# Patient Record
Sex: Male | Born: 1950 | Race: White | Hispanic: No | Marital: Married | Smoking: Current every day smoker
Health system: Southern US, Community
[De-identification: ages and names within clinical notes are randomized; demographics above are authoritative.]

## PROBLEM LIST (undated history)

## (undated) DIAGNOSIS — G4730 Sleep apnea, unspecified: Secondary | ICD-10-CM

## (undated) DIAGNOSIS — I739 Peripheral vascular disease, unspecified: Secondary | ICD-10-CM

## (undated) DIAGNOSIS — I10 Essential (primary) hypertension: Secondary | ICD-10-CM

## (undated) DIAGNOSIS — M5430 Sciatica, unspecified side: Secondary | ICD-10-CM

## (undated) DIAGNOSIS — M1990 Unspecified osteoarthritis, unspecified site: Secondary | ICD-10-CM

## (undated) DIAGNOSIS — C801 Malignant (primary) neoplasm, unspecified: Secondary | ICD-10-CM

## (undated) DIAGNOSIS — I639 Cerebral infarction, unspecified: Secondary | ICD-10-CM

## (undated) DIAGNOSIS — E785 Hyperlipidemia, unspecified: Secondary | ICD-10-CM

## (undated) DIAGNOSIS — I6529 Occlusion and stenosis of unspecified carotid artery: Secondary | ICD-10-CM

## (undated) DIAGNOSIS — C140 Malignant neoplasm of pharynx, unspecified: Secondary | ICD-10-CM

## (undated) HISTORY — DX: Unspecified osteoarthritis, unspecified site: M19.90

## (undated) HISTORY — DX: Essential (primary) hypertension: I10

## (undated) HISTORY — DX: Sciatica, unspecified side: M54.30

## (undated) HISTORY — PX: CERVICAL LAMINECTOMY: SHX94

## (undated) HISTORY — DX: Hyperlipidemia, unspecified: E78.5

## (undated) HISTORY — PX: COLONOSCOPY: SHX174

## (undated) HISTORY — DX: Sleep apnea, unspecified: G47.30

## (undated) HISTORY — PX: OTHER SURGICAL HISTORY: SHX169

## (undated) HISTORY — DX: Occlusion and stenosis of unspecified carotid artery: I65.29

## (undated) HISTORY — PX: CYST EXCISION: SHX5701

## (undated) HISTORY — PX: WISDOM TOOTH EXTRACTION: SHX21

---

## 1986-01-12 DIAGNOSIS — K269 Duodenal ulcer, unspecified as acute or chronic, without hemorrhage or perforation: Secondary | ICD-10-CM

## 1986-01-12 HISTORY — DX: Duodenal ulcer, unspecified as acute or chronic, without hemorrhage or perforation: K26.9

## 2006-12-29 ENCOUNTER — Ambulatory Visit: Payer: Self-pay | Admitting: Gastroenterology

## 2008-11-28 ENCOUNTER — Ambulatory Visit: Payer: Self-pay | Admitting: Internal Medicine

## 2015-08-27 ENCOUNTER — Encounter: Payer: Self-pay | Admitting: Emergency Medicine

## 2015-08-27 ENCOUNTER — Emergency Department
Admission: EM | Admit: 2015-08-27 | Discharge: 2015-08-27 | Disposition: A | Discharge status: Home / Self Care | Payer: Medicare Other | Attending: Emergency Medicine | Admitting: Emergency Medicine

## 2015-08-27 ENCOUNTER — Emergency Department: Payer: Medicare Other

## 2015-08-27 DIAGNOSIS — Z5321 Procedure and treatment not carried out due to patient leaving prior to being seen by health care provider: Secondary | ICD-10-CM | POA: Insufficient documentation

## 2015-08-27 DIAGNOSIS — R05 Cough: Secondary | ICD-10-CM | POA: Diagnosis not present

## 2015-08-27 DIAGNOSIS — R51 Headache: Secondary | ICD-10-CM | POA: Diagnosis not present

## 2015-08-27 DIAGNOSIS — R0602 Shortness of breath: Secondary | ICD-10-CM | POA: Insufficient documentation

## 2015-08-27 DIAGNOSIS — F17210 Nicotine dependence, cigarettes, uncomplicated: Secondary | ICD-10-CM | POA: Diagnosis not present

## 2015-08-27 IMAGING — CR DG CHEST 2V
1 series · 2 of 2 positions shown · non-contrast
Comparison: None.

CLINICAL DATA: Cough and short of breath

EXAM:
CHEST  2 VIEW

[Series 1: w chest pa · 0.14mm/px · 2 of 2 slices shown]
[im 1/2]
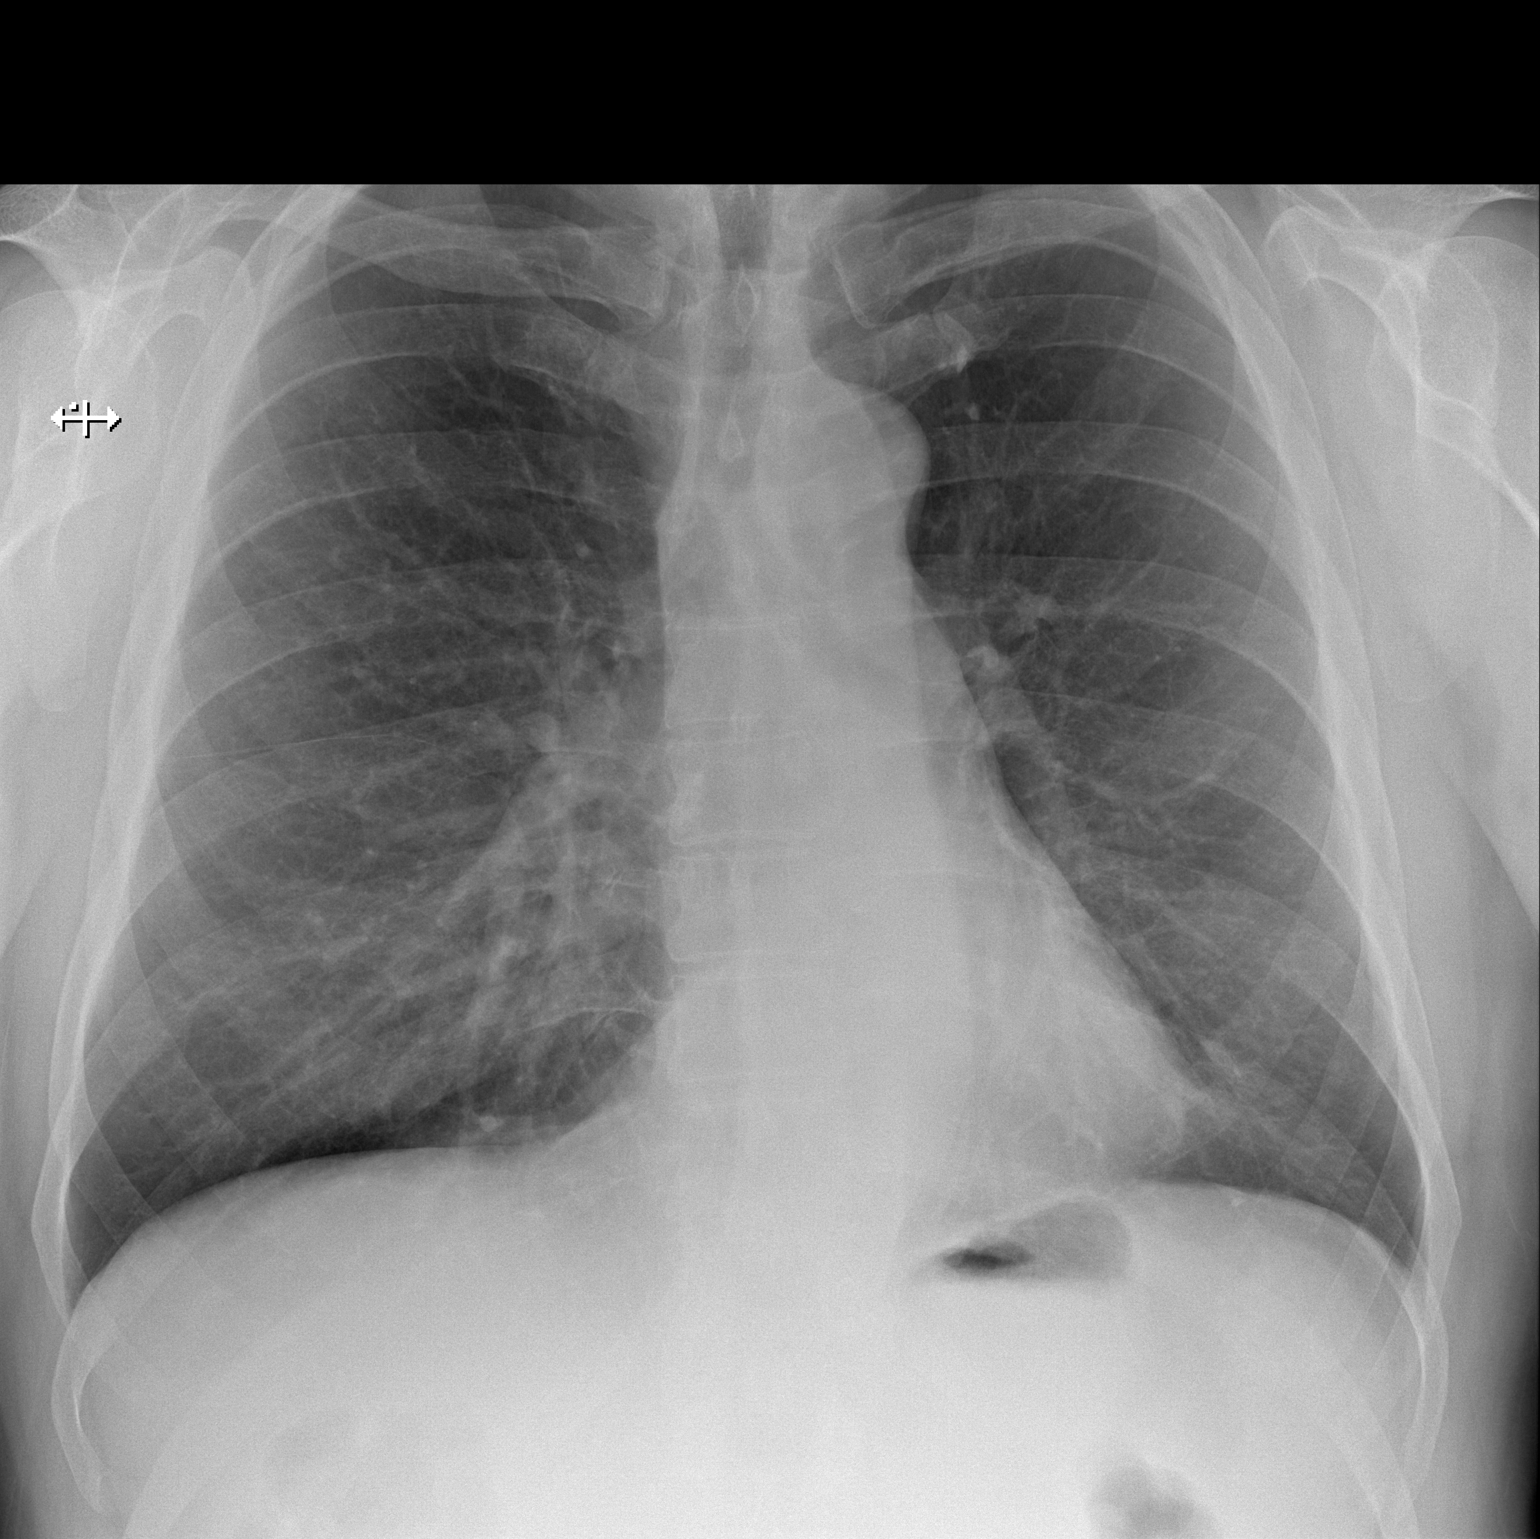
[im 2/2]
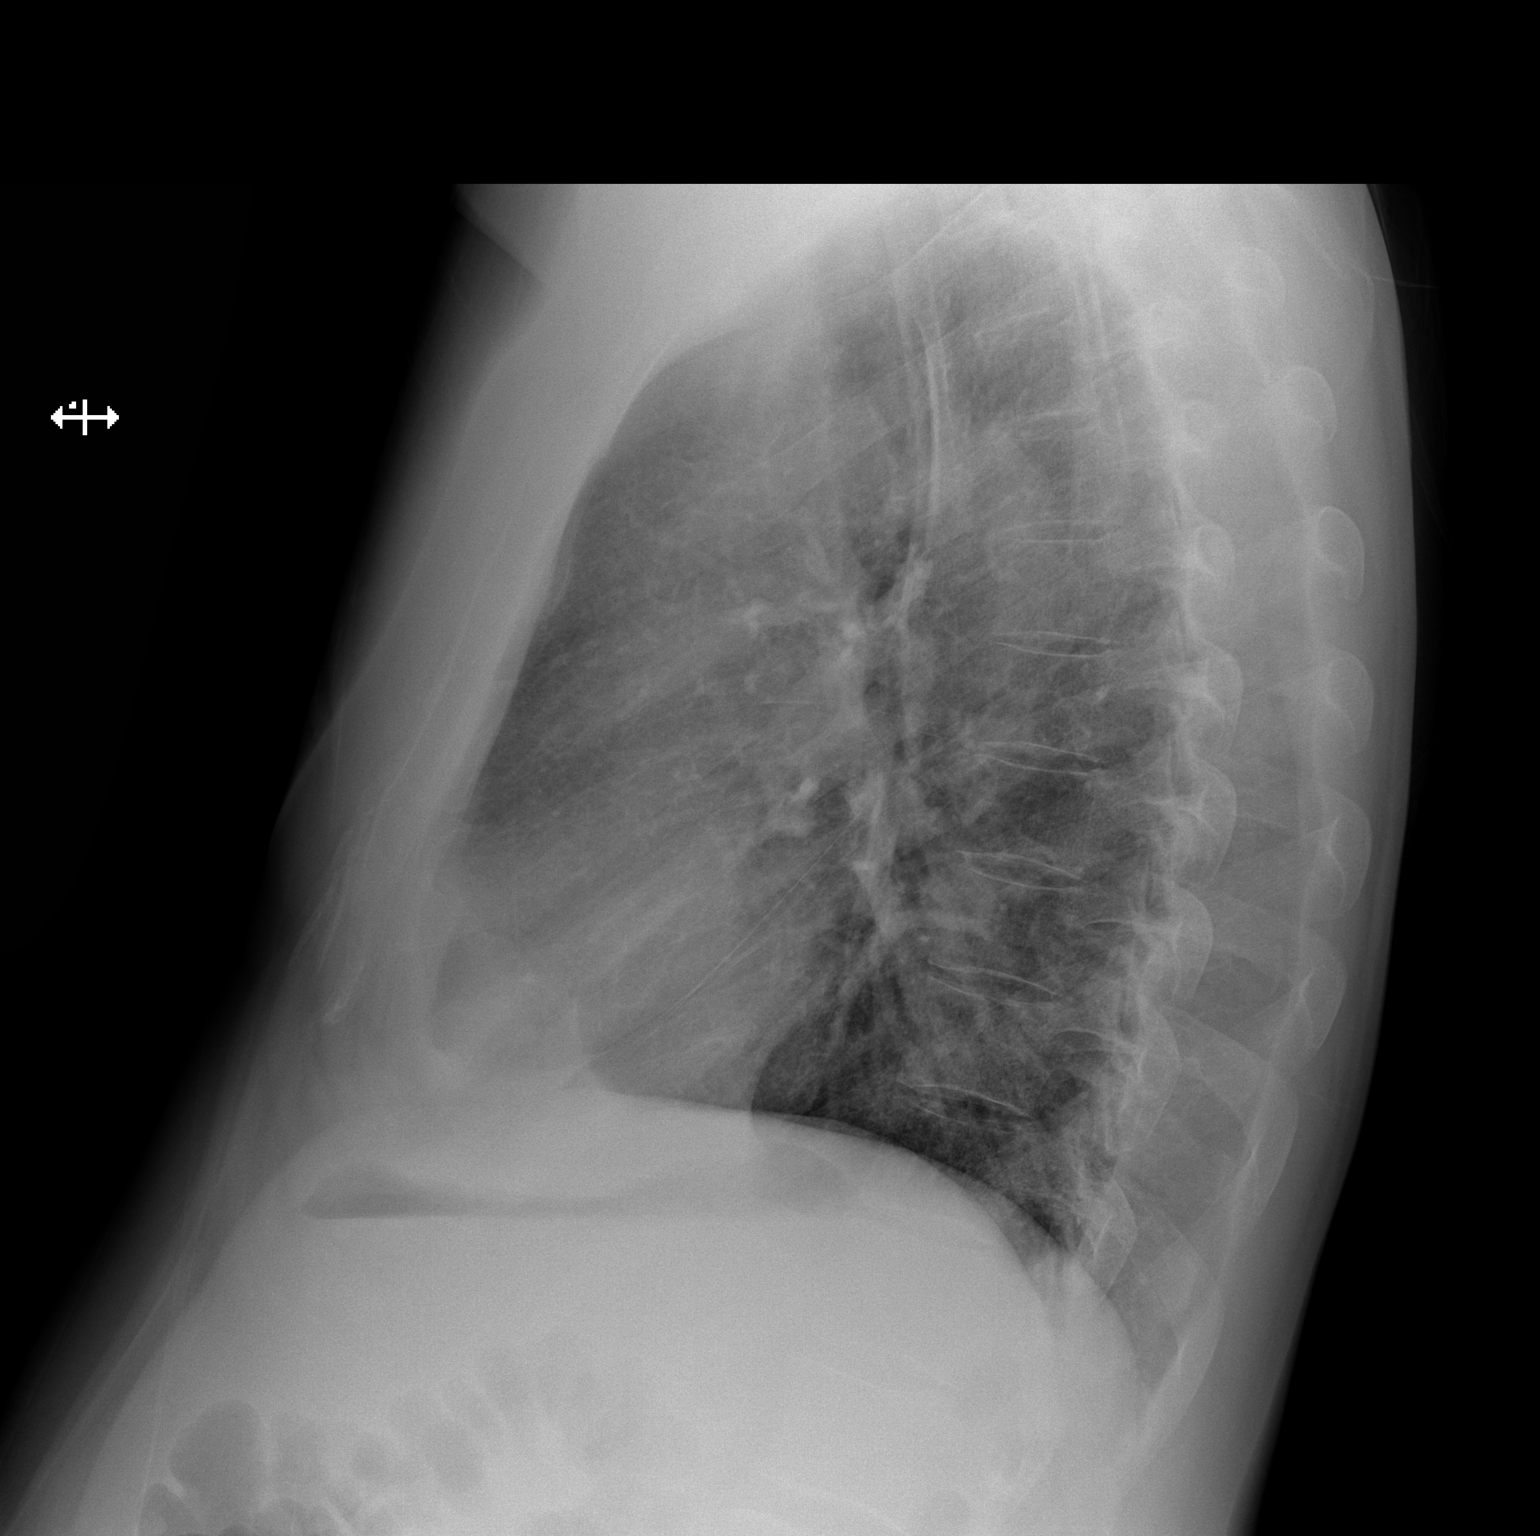

[2 of 2 positions shown; findings below may reference images not displayed]

FINDINGS: The heart size and mediastinal contours are within normal limits.
Both lungs are clear. The visualized skeletal structures are
unremarkable.
IMPRESSION: No active cardiopulmonary disease.

## 2015-08-27 NOTE — ED Triage Notes (Signed)
Pt comes into the ED via POV c/o shortness of breath along with a cough and headache.  Patient presents in NAD at this time with even and unlabored respirations.  Patient believes that every time he takes a deep breath, he will begin coughing.  Denies any chest pain, N/V.  Patient was around his sick grandchildren this past weekend and has been feeling bad since then.  Denies any fever at this time.

## 2016-07-17 ENCOUNTER — Emergency Department (HOSPITAL_COMMUNITY): Payer: Medicare Other

## 2016-07-17 ENCOUNTER — Emergency Department (HOSPITAL_COMMUNITY)
Admission: EM | Admit: 2016-07-17 | Discharge: 2016-07-18 | Disposition: A | Discharge status: Home / Self Care | Payer: Medicare Other | Attending: Emergency Medicine | Admitting: Emergency Medicine

## 2016-07-17 ENCOUNTER — Encounter (HOSPITAL_COMMUNITY): Payer: Self-pay | Admitting: Emergency Medicine

## 2016-07-17 DIAGNOSIS — I6522 Occlusion and stenosis of left carotid artery: Secondary | ICD-10-CM

## 2016-07-17 DIAGNOSIS — F17210 Nicotine dependence, cigarettes, uncomplicated: Secondary | ICD-10-CM | POA: Diagnosis not present

## 2016-07-17 DIAGNOSIS — G453 Amaurosis fugax: Secondary | ICD-10-CM

## 2016-07-17 DIAGNOSIS — H538 Other visual disturbances: Secondary | ICD-10-CM | POA: Insufficient documentation

## 2016-07-17 LAB — BASIC METABOLIC PANEL
ANION GAP: 8 (ref 5–15)
BUN: 11 mg/dL (ref 6–20)
CALCIUM: 9.2 mg/dL (ref 8.9–10.3)
CO2: 24 mmol/L (ref 22–32)
CREATININE: 0.9 mg/dL (ref 0.61–1.24)
Chloride: 104 mmol/L (ref 101–111)
GLUCOSE: 101 mg/dL — AB (ref 65–99)
Potassium: 3.6 mmol/L (ref 3.5–5.1)
Sodium: 136 mmol/L (ref 135–145)

## 2016-07-17 LAB — CBC WITH DIFFERENTIAL/PLATELET
BASOS ABS: 0 10*3/uL (ref 0.0–0.1)
BASOS PCT: 1 %
EOS PCT: 2 %
Eosinophils Absolute: 0.2 10*3/uL (ref 0.0–0.7)
HCT: 39.9 % (ref 39.0–52.0)
Hemoglobin: 13.7 g/dL (ref 13.0–17.0)
Lymphocytes Relative: 36 %
Lymphs Abs: 2.6 10*3/uL (ref 0.7–4.0)
MCH: 31.6 pg (ref 26.0–34.0)
MCHC: 34.3 g/dL (ref 30.0–36.0)
MCV: 92.1 fL (ref 78.0–100.0)
MONOS PCT: 9 %
Monocytes Absolute: 0.7 10*3/uL (ref 0.1–1.0)
NEUTROS ABS: 3.7 10*3/uL (ref 1.7–7.7)
Neutrophils Relative %: 52 %
PLATELETS: 180 10*3/uL (ref 150–400)
RBC: 4.33 MIL/uL (ref 4.22–5.81)
RDW: 12.9 % (ref 11.5–15.5)
WBC: 7.2 10*3/uL (ref 4.0–10.5)

## 2016-07-17 IMAGING — CT CT ANGIO HEAD
1 of 12 series · 5 of 33 positions shown · IV contrast (isovue)
Comparison: None.

CLINICAL DATA: Initial evaluation for left eye going gray for 1
year.

EXAM:
CT ANGIOGRAPHY HEAD AND NECK
TECHNIQUE: Multidetector CT imaging of the head and neck was performed using
the standard protocol during bolus administration of intravenous
contrast. Multiplanar CT image reconstructions and MIPs were
obtained to evaluate the vascular anatomy. Carotid stenosis
measurements (when applicable) are obtained utilizing NASCET
criteria, using the distal internal carotid diameter as the
denominator.
CONTRAST:  50 cc of Isovue 370.

[Series 14: cta neck axial · axial · 0.39mm/px · z∈[-249,+7]mm · 5 of 386 slices shown]
[im 65/386  soft-tissue]
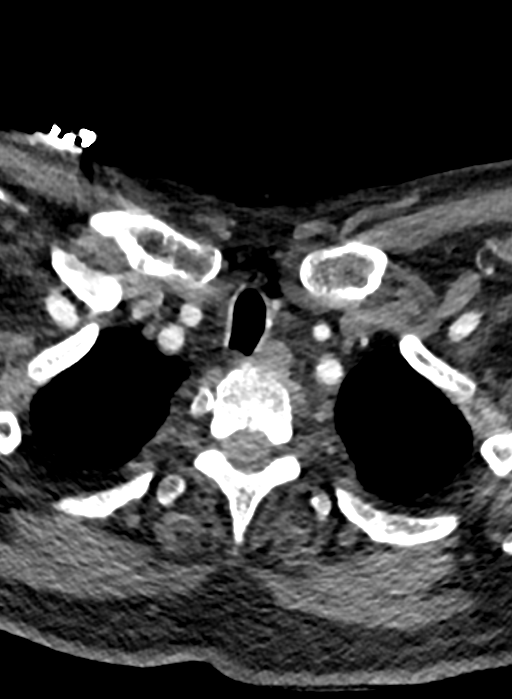
[im 129/386  bone]
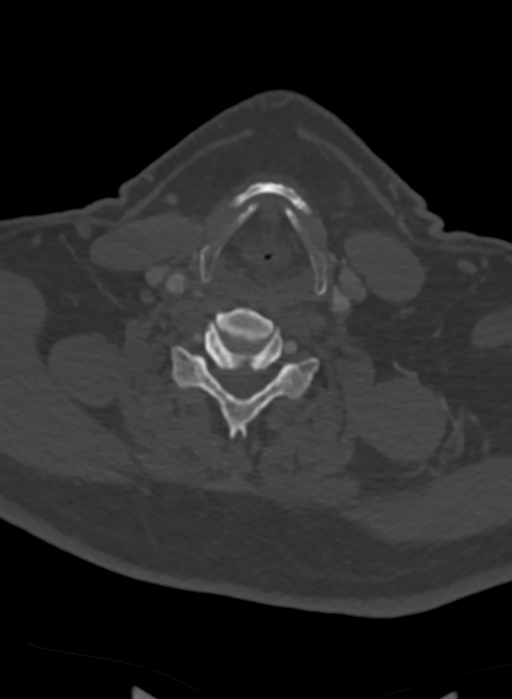
[im 193/386  soft-tissue]
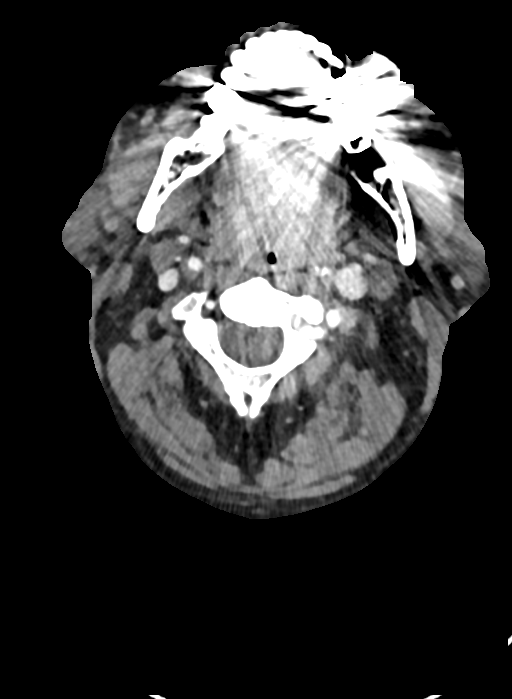
[im 257/386  bone]
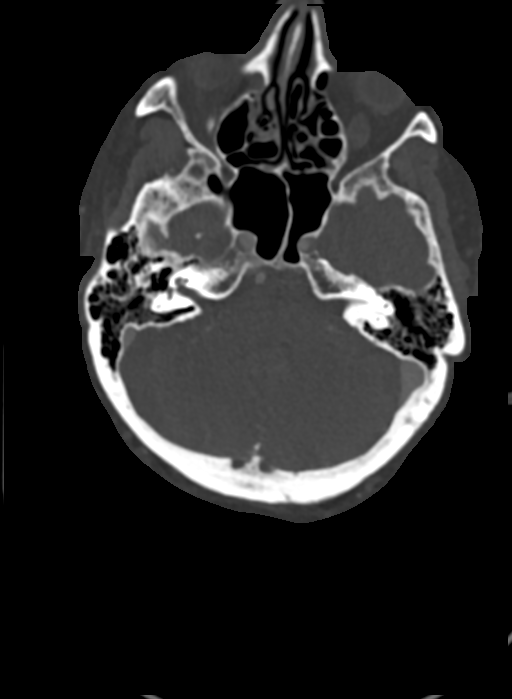
[im 321/386  soft-tissue]
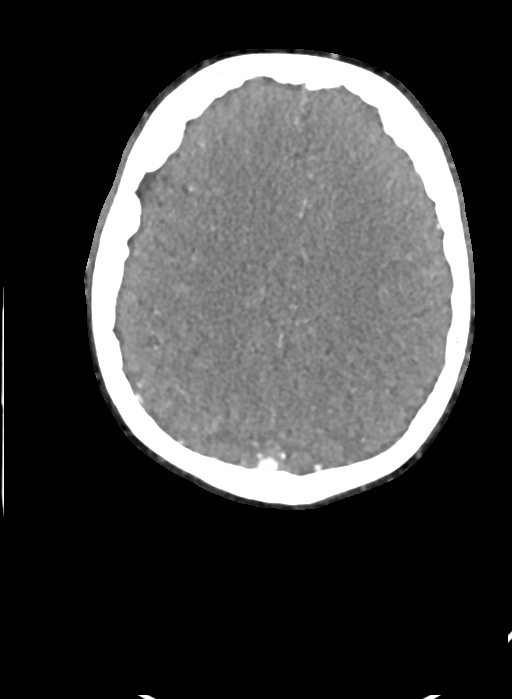

[5 of 33 positions shown; findings below may reference images not displayed]

FINDINGS: CT HEAD FINDINGS

Brain: No acute intracranial hemorrhage. No evidence for acute large
vessel territory infarct. No mass lesion, midline shift or mass
effect. No hydrocephalus. No extra-axial fluid collection.

Vascular: No hyperdense vessel.

Skull: Scalp soft tissues within normal limits.  Calvarium intact.

Sinuses: Scattered mucosal thickening within the ethmoidal air
cells. Paranasal sinuses are otherwise clear. No mastoid effusion.

Orbits: Globes and orbital soft tissues within normal limits.

Review of the MIP images confirms the above findings

CTA NECK FINDINGS

Aortic arch: Visualized aortic arch of normal caliber with normal
branch pattern. Mild atheromatous plaque within the arch and about
the origin of the great vessels without flow-limiting stenosis.
Visualized subclavian artery is widely patent.

Right carotid system: Right common carotid artery widely patent from
its origin to the bifurcation. Short-segment atheromatous stenosis
of up to 65% by NASCET criteria at the proximal right ICA. Right ICA
widely patent distally to the skullbase without stenosis,
dissection, or occlusion.

Left carotid system: Left common carotid artery patent from its
origin to the bifurcation without stenosis. Atheromatous plaque at
the proximal left ICA with severe near occlusive stenosis (greater
than 90% by NASCET criteria) (series 12, image 129). Stenosis begins
at the bifurcation and measures approximately 13 mm in length.
Distally, left ICA diminutive in caliber as compared to the right
but is patent to the skullbase without additional stenosis. No
evidence for dissection or occlusion.

Vertebral arteries: Both of the vertebral arteries arise from the
subclavian arteries. Left vertebral artery dominant. Short
fenestration noted at the proximal right V2 segment. Vertebral
arteries patent within the neck without stenosis, dissection, or
occlusion.

Skeleton: No acute osseus abnormality. No worrisome lytic or blastic
osseous lesions. Mild degenerative spondylolysis noted at C5-6 and
C6-7.

Other neck: No acute soft tissue abnormality within the neck.
Salivary glands within normal limits. No adenopathy. Thyroid normal.

Upper chest: Visualized upper chest within normal limits. Mild
centrilobular emphysema.

Review of the MIP images confirms the above findings

CTA HEAD FINDINGS

Anterior circulation: Mild atheromatous plaque at the horizontal
petrous left ICA without stenosis. Petrous segments otherwise widely
patent. Scattered calcified plaque within the cavernous/
supraclinoid ICAs bilaterally. There is an associated moderate
stenosis (approximately 50%) at the supraclinoid left ICA and
(series 14, image 115). No significant stenosis at the cavernous/
supraclinoid right ICA. ICA termini patent bilaterally. A1 segments
patent. Anterior communicating artery within normal limits. Anterior
cerebral arteries patent to their distal aspects. M1 segments patent
without stenosis or occlusion. No proximal M2 occlusion. Distal MCA
branches well opacified and symmetric.

Posterior circulation: Dominant left vertebral artery patent to the
vertebrobasilar junction without stenosis. Right vertebral artery
hypoplastic with multifocal atheromatous irregularity but is patent
to the vertebrobasilar junction as well. Posterior inferior cerebral
arteries patent bilaterally. Basilar artery widely patent to its
distal aspect. Superior cerebellar arteries patent bilaterally. Both
of the posterior cerebral artery supplied via the basilar artery and
are widely patent to their distal aspects.

Venous sinuses: Pain.

Anatomic variants: No significant anatomic variant. No aneurysm or
vascular malformation.

Delayed phase: No pathologic enhancement.

Review of the MIP images confirms the above findings
IMPRESSION: CTA NECK IMPRESSION:

1. Severe high-grade near occlusive greater than 90% stenosis
involving the proximal left ICA as above. Stenosis begins at the
carotid bifurcation, and measures approximately 13 mm in length.
Left ICA is patent but diffusely narrowed distally to the skullbase.
2. Short-segment atheromatous stenosis of approximately 65% by
NASCET criteria at the right carotid bifurcation.
3. Widely patent vertebral arteries within the neck.

CTA HEAD IMPRESSION:

1. No proximal or large arterial branch occlusion within the
intracranial circulation.
2. Moderate atheromatous plaque within the carotid siphons, with
associated moderate stenosis at the supraclinoid left ICA.
3. No other high-grade or correctable stenosis within the
intracranial circulation.

## 2016-07-17 MED ORDER — SODIUM CHLORIDE 0.9 % IV BOLUS (SEPSIS)
500.0000 mL | Freq: Once | INTRAVENOUS | Status: AC
Start: 2016-07-17 — End: 2016-07-17
  Administered 2016-07-17: 500 mL via INTRAVENOUS

## 2016-07-17 MED ORDER — SODIUM CHLORIDE 0.9 % IV SOLN: INTRAVENOUS | Status: DC

## 2016-07-17 MED ORDER — ASPIRIN 81 MG PO CHEW
324.0000 mg | CHEWABLE_TABLET | Freq: Once | ORAL | Status: AC
  Administered 2016-07-18: 324 mg via ORAL
  Filled 2016-07-17: qty 4

## 2016-07-17 MED ORDER — IOPAMIDOL (ISOVUE-370) INJECTION 76%
INTRAVENOUS | Status: AC
  Filled 2016-07-17: qty 50

## 2016-07-17 MED ORDER — IOPAMIDOL (ISOVUE-370) INJECTION 76%
INTRAVENOUS | Status: AC
  Administered 2016-07-17: 50 mL via INTRAVENOUS
  Filled 2016-07-17: qty 50

## 2016-07-17 NOTE — ED Triage Notes (Signed)
Pt here with intermittent left eye blurriness; pt sts sent from eye doctor for blood work and carotid dopplers

## 2016-07-17 NOTE — Discharge Instructions (Signed)
Start taking a baby aspirin a day. Make up on his follow-up with neurology. Or follow-up with [place] urgent care. Will need outpatient workup for the eye symptoms which are consistent with a transient mini stroke. Return for any new or worse symptoms.

## 2016-07-17 NOTE — ED Provider Notes (Signed)
[facility] DEPT Provider Note   CSN: [id number] Arrival date & time: 07/17/16  1511     History   Chief Complaint Chief Complaint  Patient presents with  . Blurred Vision    HPI Samuel Serrano is a 66 y.o. male.  Patient with a history of at least 3 episodes of acute left eye blackening and then slowly resolving. Consistent with TIA. Most recent symptoms were a week ago. Initial symptom was rolled day of 2017. Patient finally went to optometrist for evaluation. Raise some concerns for embolic phenomenon. Otherwise I checked out fine. Patient went to [facility] urgent care. And was referred here. Patient currently asymptomatic. Patient will need outpatient workup for TIAs. No focal findings now.      History reviewed. No pertinent past medical history.  There are no active problems to display for this patient.   Past Surgical History:  Procedure Laterality Date  . CYST EXCISION         Home Medications    Prior to Admission medications   Not on File    Family History History reviewed. No pertinent family history.  Social History Social History  Substance Use Topics  . Smoking status: Current Every Day Smoker    Packs/day: 1.00    Types: Cigarettes  . Smokeless tobacco: Never Used  . Alcohol use Yes     Comment: occasional     Allergies   Patient has no known allergies.   Review of Systems Review of Systems  Constitutional: Negative for fever.  HENT: Negative for congestion.   Eyes: Positive for visual disturbance.  Respiratory: Negative for shortness of breath.   Cardiovascular: Negative for chest pain.  Gastrointestinal: Negative for abdominal pain.  Genitourinary: Negative for dysuria.  Musculoskeletal: Negative for myalgias.  Skin: Negative for rash.  Neurological: Negative for dizziness, facial asymmetry, speech difficulty, weakness and headaches.  Hematological: Does not bruise/bleed easily.  Psychiatric/Behavioral: Negative for confusion.      Physical Exam Updated Vital Signs BP (!) 151/75   Pulse 72   Temp 97.7 F (36.5 C) (Oral)   Resp (!) 26   SpO2 94%   Physical Exam  Constitutional: He is oriented to person, place, and time. He appears well-developed and well-nourished. No distress.  HENT:  Head: Normocephalic and atraumatic.  Mouth/Throat: Oropharynx is clear and moist.  Eyes: Conjunctivae and EOM are normal. Pupils are equal, round, and reactive to light.  Neck: Normal range of motion. Neck supple.  Cardiovascular: Normal rate, regular rhythm and normal heart sounds.   Pulmonary/Chest: Effort normal and breath sounds normal. No respiratory distress.  Abdominal: Soft. Bowel sounds are normal. There is no tenderness.  Musculoskeletal: Normal range of motion.  Neurological: He is alert and oriented to person, place, and time. No cranial nerve deficit or sensory deficit. He exhibits normal muscle tone. Coordination normal.  Skin: Skin is warm.  Nursing note and vitals reviewed.    ED Treatments / Results  Labs (all labs ordered are listed, but only abnormal results are displayed) Labs Reviewed  BASIC METABOLIC PANEL - Abnormal; Notable for the following:       Result Value   Glucose, Bld 101 (*)    All other components within normal limits  CBC WITH DIFFERENTIAL/PLATELET    EKG  EKG Interpretation None       Radiology No results found.  Procedures Procedures (including critical care time)  Medications Ordered in ED Medications - No data to display   Initial Impression / Assessment  and Plan / ED Course  I have reviewed the triage vital signs and the nursing notes.  Pertinent labs & imaging results that were available during my care of the patient were reviewed by me and considered in my medical decision making (see chart for details).     Patient unfortunately not able to get carotid Doppler studies here which is what urgent care [facility] sent him to have done. Discussed with the neural  hospitalist on. We decided to CT angiogram of head and neck. If negative for any significant findings patient is stable for outpatient workup for TIAs. Patient currently asymptomatic. Labs without significant abnormalities.  I recommended follow-up at [facility] urgent care which could bridge the gap since he doesn't have a primary care doctor currently. And could help arrange the outpatient workup. Also will give referral to lobe our neurology.  CT angios still pending.  Final Clinical Impressions(s) / ED Diagnoses   Final diagnoses:  None    New Prescriptions New Prescriptions   No medications on file     Fredia Sorrow, MD 07/17/16 [phone]

## 2016-07-18 MED ORDER — ASPIRIN 81 MG PO CHEW: 81.0000 mg | CHEWABLE_TABLET | Freq: Every day | ORAL | 0 refills | Status: DC

## 2016-07-18 MED ORDER — ATORVASTATIN CALCIUM 40 MG PO TABS: 40.0000 mg | ORAL_TABLET | Freq: Every day | ORAL | 0 refills | Status: DC

## 2016-07-18 MED ORDER — CLOPIDOGREL BISULFATE 75 MG PO TABS: 75.0000 mg | ORAL_TABLET | Freq: Every day | ORAL | 0 refills | Status: DC

## 2016-07-18 NOTE — ED Notes (Signed)
Pt verbalized understanding discharge instructions and denies any further needs or questions at this time. VS stable, ambulatory and steady gait.   

## 2016-07-18 NOTE — ED Provider Notes (Signed)
Pt stable No new symptoms Reports left eye visual last week, last episode a week ago CT results reviewed with neurology He has high grade stenosis on LEFT Dr Armida Sans recommends asa 81 daily plavix 75mg  daily lipitor 40mg  daily F/u with vascular ASAP This was endorsed to patient    Ripley Fraise, MD 07/18/16 0106

## 2016-07-20 ENCOUNTER — Encounter: Payer: Self-pay | Admitting: Vascular Surgery

## 2016-07-21 ENCOUNTER — Encounter: Payer: Self-pay | Admitting: Vascular Surgery

## 2016-07-21 ENCOUNTER — Other Ambulatory Visit: Payer: Self-pay

## 2016-07-21 ENCOUNTER — Ambulatory Visit (INDEPENDENT_AMBULATORY_CARE_PROVIDER_SITE_OTHER): Payer: Medicare Other | Admitting: Vascular Surgery

## 2016-07-21 VITALS — BP 150/90 | HR 68 | Temp 97.4°F | Resp 16 | Ht 73.0 in | Wt 257.0 lb

## 2016-07-21 DIAGNOSIS — I6522 Occlusion and stenosis of left carotid artery: Secondary | ICD-10-CM | POA: Diagnosis not present

## 2016-07-21 NOTE — Progress Notes (Signed)
Vascular and Vein Specialist of [facility]  Patient name: Samuel Serrano MRN: [id number] DOB: 07-10-1950 Sex: male  REASON FOR CONSULT: Symptomatic left carotid stenosis  HPI: Samuel Serrano is a 66 y.o. male, who is here today for evaluation. Was seen in emergency department 2 days ago with no clear-cut amaurosis fugax. He reports a dating back for the proximally one year he has had approximate 5 different episodes of this. In one CT angiogram showing critical stenosis in his left internal carotid artery. All of his visual changes have been his left eye. He did see ophthalmologist on 07/17/2016 and his notes indicate 2 separate: Hours plaque in his left eye. He cyst. Denies any prior focal deficits such as unilateral weakness or slurred speech. He is right handed. He denies any cardiac difficulty.  No past medical history on file.  No family history on file.  SOCIAL HISTORY: Social History   Social History  . Marital status: Married    Spouse name: N/A  . Number of children: N/A  . Years of education: N/A   Occupational History  . Not on file.   Social History Main Topics  . Smoking status: Current Every Day Smoker    Packs/day: 1.40    Types: Cigarettes  . Smokeless tobacco: Never Used     Comment: 1 1/4 pk per day  . Alcohol use Yes     Comment: occasional  . Drug use: No  . Sexual activity: Not on file   Other Topics Concern  . Not on file   Social History Narrative  . No narrative on file    No Known Allergies  Current Outpatient Prescriptions  Medication Sig Dispense Refill  . aspirin 81 MG chewable tablet Chew 1 tablet (81 mg total) by mouth daily. 30 tablet 0  . atorvastatin (LIPITOR) 40 MG tablet Take 1 tablet (40 mg total) by mouth daily. 30 tablet 0  . clopidogrel (PLAVIX) 75 MG tablet Take 1 tablet (75 mg total) by mouth daily. 30 tablet 0  . Pseudoeph-Doxylamine-DM-APAP (NYQUIL PO) Take 45 mLs by mouth at bedtime.     No  current facility-administered medications for this visit.     REVIEW OF SYSTEMS:  [X]  denotes positive finding, [ ]  denotes negative finding Cardiac  Comments:  Chest pain or chest pressure:    Shortness of breath upon exertion:    Short of breath when lying flat:    Irregular heart rhythm:        Vascular    Pain in calf, thigh, or hip brought on by ambulation:    Pain in feet at night that wakes you up from your sleep:     Blood clot in your veins:    Leg swelling:         Pulmonary    Oxygen at home:    Productive cough:     Wheezing:         Neurologic    Sudden weakness in arms or legs:     Sudden numbness in arms or legs:     Sudden onset of difficulty speaking or slurred speech:    Temporary loss of vision in one eye:  x   Problems with dizziness:         Gastrointestinal    Blood in stool:     Vomited blood:         Genitourinary    Burning when urinating:     Blood in urine:  Psychiatric    Major depression:         Hematologic    Bleeding problems:    Problems with blood clotting too easily:        Skin    Rashes or ulcers:        Constitutional    Fever or chills:      PHYSICAL EXAM: Vitals:   07/21/16 1134 07/21/16 1143  BP: (!) 154/97 (!) 150/90  Pulse: 68   Resp: 16   Temp: (!) 97.4 F (36.3 C)   TempSrc: Oral   SpO2: 96%   Weight: 257 lb (116.6 kg)   Height: 6\' 1"  (1.854 m)     GENERAL: The patient is a well-nourished male, in no acute distress. The vital signs are documented above. CARDIOVASCULAR: Carotid arteries are bruits bilaterally. 2+ radial and 2+ dorsalis pedis pulses bilaterally PULMONARY: There is good air exchange  ABDOMEN: Soft and non-tender  MUSCULOSKELETAL: There are no major deformities or cyanosis. NEUROLOGIC: No focal weakness or paresthesias are detected. SKIN: There are no ulcers or rashes noted. PSYCHIATRIC: The patient has a normal affect.  DATA:  CT angiogram from Hospital was reviewed and  discussed with the patient and his wife present. This does show critical stenosis in his left internal carotid artery the bifurcation. He does have a relatively hypoperfused internal carotid artery distal to the bifurcation.  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 Explained this is certainly the most likely source and recommended endarterectomy for reduction of stroke risk. I explained the procedure including risk of stroke with the procedure. Also explained my concern with his small hypoperfused internal artery just the bifurcation. Explained this typically is simply related to low pressure flow and not a sclerotic artery. He understands and wished to proceed with surgery as possible as possible. Schedule this for 07/24/2016   Rosetta Posner, MD [facility] Vascular and Vein Specialists of [facility] Tel [phone] Pager [phone]

## 2016-07-22 ENCOUNTER — Encounter (HOSPITAL_COMMUNITY)
Admission: RE | Admit: 2016-07-22 | Discharge: 2016-07-22 | Disposition: A | Discharge status: Home / Self Care | Payer: Medicare Other | Source: Ambulatory Visit | Attending: Vascular Surgery | Admitting: Vascular Surgery

## 2016-07-22 ENCOUNTER — Other Ambulatory Visit: Payer: Self-pay

## 2016-07-22 ENCOUNTER — Other Ambulatory Visit (HOSPITAL_COMMUNITY): Payer: Self-pay | Admitting: *Deleted

## 2016-07-22 ENCOUNTER — Encounter (HOSPITAL_COMMUNITY): Payer: Self-pay

## 2016-07-22 ENCOUNTER — Other Ambulatory Visit (HOSPITAL_COMMUNITY): Payer: Medicare Other

## 2016-07-22 HISTORY — DX: Peripheral vascular disease, unspecified: I73.9

## 2016-07-22 LAB — URINALYSIS, ROUTINE W REFLEX MICROSCOPIC
BILIRUBIN URINE: NEGATIVE
GLUCOSE, UA: NEGATIVE mg/dL
HGB URINE DIPSTICK: NEGATIVE
KETONES UR: NEGATIVE mg/dL
Leukocytes, UA: NEGATIVE
Nitrite: NEGATIVE
PROTEIN: NEGATIVE mg/dL
Specific Gravity, Urine: 1.021 (ref 1.005–1.030)
pH: 5 (ref 5.0–8.0)

## 2016-07-22 LAB — HEPATIC FUNCTION PANEL
ALT: 23 U/L (ref 17–63)
AST: 24 U/L (ref 15–41)
Albumin: 4.5 g/dL (ref 3.5–5.0)
Alkaline Phosphatase: 52 U/L (ref 38–126)
BILIRUBIN DIRECT: 0.2 mg/dL (ref 0.1–0.5)
BILIRUBIN INDIRECT: 1.2 mg/dL — AB (ref 0.3–0.9)
Total Bilirubin: 1.4 mg/dL — ABNORMAL HIGH (ref 0.3–1.2)
Total Protein: 7.3 g/dL (ref 6.5–8.1)

## 2016-07-22 LAB — ABO/RH: ABO/RH(D): B POS

## 2016-07-22 LAB — SURGICAL PCR SCREEN
MRSA, PCR: NEGATIVE
STAPHYLOCOCCUS AUREUS: NEGATIVE

## 2016-07-22 LAB — TYPE AND SCREEN
ABO/RH(D): B POS
Antibody Screen: NEGATIVE

## 2016-07-22 LAB — APTT: APTT: 29 s (ref 24–36)

## 2016-07-22 LAB — PROTIME-INR
INR: 1.05
Prothrombin Time: 13.7 seconds (ref 11.4–15.2)

## 2016-07-22 NOTE — Progress Notes (Signed)
Pt denies cardiac history, chest pain or sob. Pt states he's not a diabetic. Pt instructed not to smoke 24 hours prior to surgery.

## 2016-07-22 NOTE — Pre-Procedure Instructions (Addendum)
Samuel Serrano  07/22/2016    Your procedure is scheduled on Friday, July 24, 2016 at 7:30 AM.   Report to [facility] Entrance "A" Admitting Office at 5:30 AM.   Call this number if you have problems the morning of surgery: [phone]   Questions prior to day of surgery, please call [phone] between 8 & 4 PM.   Remember:  Do not eat food or drink liquids after midnight Thursday, 07/23/16.  Take these medicines the morning of surgery with A SIP OF WATER: Aspirin, Plavix  Stop Nyquil as today.  Do NOT smoke 24 hours prior to surgery.   Do not wear jewelry.  Do not wear lotions, powders, cologne or deodorant.  Men may shave face and neck.  Do not bring valuables to the hospital.  [facility] is not responsible for any belongings or valuables.  Contacts, dentures or bridgework may not be worn into surgery.  Leave your suitcase in the car.  After surgery it may be brought to your room.  For patients admitted to the hospital, discharge time will be determined by your treatment team.  [facility] - Preparing for Surgery  Before surgery, you can play an important role.  Because skin is not sterile, your skin needs to be as free of germs as possible.  You can reduce the number of germs on you skin by washing with CHG (chlorahexidine gluconate) soap before surgery.  CHG is an antiseptic cleaner which kills germs and bonds with the skin to continue killing germs even after washing.  Please DO NOT use if you have an allergy to CHG or antibacterial soaps.  If your skin becomes reddened/irritated stop using the CHG and inform your nurse when you arrive at Short Stay.  Do not shave (including legs and underarms) for at least 48 hours prior to the first CHG shower.  You may shave your face.  Please follow these instructions carefully:   1.  Shower with CHG Soap the night before surgery and the                    morning of Surgery.  2.  If you choose to wash your hair, wash your  hair first as usual with your       normal shampoo.  3.  After you shampoo, rinse your hair and body thoroughly to remove the shampoo.  4.  Use CHG as you would any other liquid soap.  You can apply chg directly       to the skin and wash gently with scrungie or a clean washcloth.  5.  Apply the CHG Soap to your body ONLY FROM THE NECK DOWN.        Do not use on open wounds or open sores.  Avoid contact with your eyes, ears, mouth and genitals (private parts).  Wash genitals (private parts) with your normal soap.  6.  Wash thoroughly, paying special attention to the area where your surgery        will be performed.  7.  Thoroughly rinse your body with warm water from the neck down.  8.  DO NOT shower/wash with your normal soap after using and rinsing off       the CHG Soap.  9.  Pat yourself dry with a clean towel.            10.  Wear clean pajamas.  11.  Place clean sheets on your bed the night of your first shower and do not        sleep with pets.  Day of Surgery  Do not apply any lotions/deodorants the morning of surgery.  Please wear clean clothes to the hospital.   Please read over the fact sheets that you were given.

## 2016-07-24 ENCOUNTER — Encounter (HOSPITAL_COMMUNITY): Payer: Self-pay

## 2016-07-24 ENCOUNTER — Encounter (HOSPITAL_COMMUNITY)
Admission: RE | Disposition: A | Discharge status: Home / Self Care | Payer: Self-pay | Source: Ambulatory Visit | Attending: Vascular Surgery

## 2016-07-24 ENCOUNTER — Telehealth: Payer: Self-pay | Admitting: Vascular Surgery

## 2016-07-24 ENCOUNTER — Inpatient Hospital Stay (HOSPITAL_COMMUNITY)
Admission: RE | Admit: 2016-07-24 | Discharge: 2016-07-25 | DRG: 039 | Disposition: A | Discharge status: Home / Self Care | Payer: Medicare Other | Source: Ambulatory Visit | Attending: Vascular Surgery | Admitting: Vascular Surgery

## 2016-07-24 ENCOUNTER — Inpatient Hospital Stay (HOSPITAL_COMMUNITY): Payer: Medicare Other | Admitting: Certified Registered Nurse Anesthetist

## 2016-07-24 DIAGNOSIS — Z7902 Long term (current) use of antithrombotics/antiplatelets: Secondary | ICD-10-CM

## 2016-07-24 DIAGNOSIS — R339 Retention of urine, unspecified: Secondary | ICD-10-CM | POA: Diagnosis not present

## 2016-07-24 DIAGNOSIS — I739 Peripheral vascular disease, unspecified: Secondary | ICD-10-CM | POA: Diagnosis present

## 2016-07-24 DIAGNOSIS — F17210 Nicotine dependence, cigarettes, uncomplicated: Secondary | ICD-10-CM | POA: Diagnosis present

## 2016-07-24 DIAGNOSIS — Z7982 Long term (current) use of aspirin: Secondary | ICD-10-CM | POA: Diagnosis not present

## 2016-07-24 DIAGNOSIS — I6522 Occlusion and stenosis of left carotid artery: Principal | ICD-10-CM | POA: Diagnosis present

## 2016-07-24 DIAGNOSIS — I6529 Occlusion and stenosis of unspecified carotid artery: Secondary | ICD-10-CM | POA: Diagnosis present

## 2016-07-24 HISTORY — PX: ENDARTERECTOMY: SHX5162

## 2016-07-24 HISTORY — PX: PATCH ANGIOPLASTY: SHX6230

## 2016-07-24 LAB — CBC
HCT: 36.4 % — ABNORMAL LOW (ref 39.0–52.0)
HEMOGLOBIN: 12.4 g/dL — AB (ref 13.0–17.0)
MCH: 31.5 pg (ref 26.0–34.0)
MCHC: 34.1 g/dL (ref 30.0–36.0)
MCV: 92.4 fL (ref 78.0–100.0)
PLATELETS: 156 10*3/uL (ref 150–400)
RBC: 3.94 MIL/uL — AB (ref 4.22–5.81)
RDW: 12.6 % (ref 11.5–15.5)
WBC: 8.8 10*3/uL (ref 4.0–10.5)

## 2016-07-24 LAB — CREATININE, SERUM
CREATININE: 0.81 mg/dL (ref 0.61–1.24)
GFR calc Af Amer: >60 mL/min (ref >60)

## 2016-07-24 SURGERY — ENDARTERECTOMY, CAROTID
Anesthesia: General | Site: Neck | Laterality: Left

## 2016-07-24 MED ORDER — DEXTROSE 5 % IV SOLN
1.5000 g | Freq: Two times a day (BID) | INTRAVENOUS | Status: AC
  Administered 2016-07-24 – 2016-07-25 (×2): 1.5 g via INTRAVENOUS
  Filled 2016-07-24 (×2): qty 1.5

## 2016-07-24 MED ORDER — OXYCODONE-ACETAMINOPHEN 5-325 MG PO TABS
1.0000 | ORAL_TABLET | ORAL | Status: DC | PRN
  Administered 2016-07-24: 1 via ORAL
  Filled 2016-07-24: qty 1

## 2016-07-24 MED ORDER — DEXTROSE 5 % IV SOLN
1.5000 g | INTRAVENOUS | Status: AC
  Administered 2016-07-24: 1.5 g via INTRAVENOUS
  Filled 2016-07-24: qty 1.5

## 2016-07-24 MED ORDER — OXYCODONE-ACETAMINOPHEN 5-325 MG PO TABS: 1.0000 | ORAL_TABLET | Freq: Four times a day (QID) | ORAL | 0 refills | Status: DC | PRN

## 2016-07-24 MED ORDER — ACETAMINOPHEN 650 MG RE SUPP
325.0000 mg | RECTAL | Status: DC | PRN
Start: 2016-07-24 — End: 2016-07-25

## 2016-07-24 MED ORDER — PHENYLEPHRINE 40 MCG/ML (10ML) SYRINGE FOR IV PUSH (FOR BLOOD PRESSURE SUPPORT)
PREFILLED_SYRINGE | INTRAVENOUS | Status: DC | PRN
  Administered 2016-07-24: 80 ug via INTRAVENOUS
  Administered 2016-07-24: 120 ug via INTRAVENOUS
  Administered 2016-07-24: 80 ug via INTRAVENOUS

## 2016-07-24 MED ORDER — DEXAMETHASONE SODIUM PHOSPHATE 10 MG/ML IJ SOLN
INTRAMUSCULAR | Status: DC | PRN
  Administered 2016-07-24: 10 mg via INTRAVENOUS

## 2016-07-24 MED ORDER — LIDOCAINE HCL (PF) 1 % IJ SOLN
INTRAMUSCULAR | Status: AC
  Filled 2016-07-24: qty 30

## 2016-07-24 MED ORDER — EPHEDRINE 5 MG/ML INJ
INTRAVENOUS | Status: AC
  Filled 2016-07-24: qty 10

## 2016-07-24 MED ORDER — ASPIRIN 81 MG PO CHEW
81.0000 mg | CHEWABLE_TABLET | Freq: Every day | ORAL | Status: DC
Start: 2016-07-25 — End: 2016-07-25
  Administered 2016-07-25: 81 mg via ORAL
  Filled 2016-07-24: qty 1

## 2016-07-24 MED ORDER — MAGNESIUM SULFATE 2 GM/50ML IV SOLN: 2.0000 g | Freq: Every day | INTRAVENOUS | Status: DC | PRN

## 2016-07-24 MED ORDER — LACTATED RINGERS IV SOLN
INTRAVENOUS | Status: DC | PRN
  Administered 2016-07-24 (×2): via INTRAVENOUS

## 2016-07-24 MED ORDER — CLOPIDOGREL BISULFATE 75 MG PO TABS
75.0000 mg | ORAL_TABLET | Freq: Every day | ORAL | Status: DC
Start: 2016-07-25 — End: 2016-07-25
  Administered 2016-07-25: 75 mg via ORAL
  Filled 2016-07-24: qty 1

## 2016-07-24 MED ORDER — SUGAMMADEX SODIUM 200 MG/2ML IV SOLN
INTRAVENOUS | Status: AC
  Filled 2016-07-24: qty 2

## 2016-07-24 MED ORDER — BISACODYL 5 MG PO TBEC: 5.0000 mg | DELAYED_RELEASE_TABLET | Freq: Every day | ORAL | Status: DC | PRN

## 2016-07-24 MED ORDER — CHLORHEXIDINE GLUCONATE 4 % EX LIQD: 60.0000 mL | Freq: Once | CUTANEOUS | Status: DC

## 2016-07-24 MED ORDER — EPHEDRINE SULFATE-NACL 50-0.9 MG/10ML-% IV SOSY
PREFILLED_SYRINGE | INTRAVENOUS | Status: DC | PRN
  Administered 2016-07-24: 10 mg via INTRAVENOUS
  Administered 2016-07-24: 5 mg via INTRAVENOUS
  Administered 2016-07-24: 10 mg via INTRAVENOUS

## 2016-07-24 MED ORDER — ROCURONIUM BROMIDE 50 MG/5ML IV SOLN
INTRAVENOUS | Status: AC
  Filled 2016-07-24: qty 1

## 2016-07-24 MED ORDER — ONDANSETRON HCL 4 MG/2ML IJ SOLN
4.0000 mg | Freq: Four times a day (QID) | INTRAMUSCULAR | Status: DC | PRN
  Administered 2016-07-24: 4 mg via INTRAVENOUS
  Filled 2016-07-24: qty 2

## 2016-07-24 MED ORDER — LABETALOL HCL 5 MG/ML IV SOLN: 10.0000 mg | INTRAVENOUS | Status: DC | PRN

## 2016-07-24 MED ORDER — OXYCODONE HCL 5 MG PO TABS: 5.0000 mg | ORAL_TABLET | Freq: Once | ORAL | Status: DC | PRN

## 2016-07-24 MED ORDER — SODIUM CHLORIDE 0.9 % IV SOLN: 500.0000 mL | Freq: Once | INTRAVENOUS | Status: DC | PRN

## 2016-07-24 MED ORDER — LIDOCAINE HCL (CARDIAC) 20 MG/ML IV SOLN
INTRAVENOUS | Status: AC
  Filled 2016-07-24: qty 5

## 2016-07-24 MED ORDER — HEPARIN SODIUM (PORCINE) 1000 UNIT/ML IJ SOLN
INTRAMUSCULAR | Status: AC
  Filled 2016-07-24: qty 1

## 2016-07-24 MED ORDER — METOPROLOL TARTRATE 5 MG/5ML IV SOLN: 2.0000 mg | INTRAVENOUS | Status: DC | PRN

## 2016-07-24 MED ORDER — MIDAZOLAM HCL 2 MG/2ML IJ SOLN
INTRAMUSCULAR | Status: AC
  Filled 2016-07-24: qty 2

## 2016-07-24 MED ORDER — HEPARIN SODIUM (PORCINE) 1000 UNIT/ML IJ SOLN
INTRAMUSCULAR | Status: DC | PRN
  Administered 2016-07-24: 10000 [IU] via INTRAVENOUS

## 2016-07-24 MED ORDER — PROPOFOL 10 MG/ML IV BOLUS
INTRAVENOUS | Status: AC
  Filled 2016-07-24: qty 20

## 2016-07-24 MED ORDER — POTASSIUM CHLORIDE CRYS ER 20 MEQ PO TBCR: 20.0000 meq | EXTENDED_RELEASE_TABLET | Freq: Every day | ORAL | Status: DC | PRN

## 2016-07-24 MED ORDER — PHENYLEPHRINE HCL 10 MG/ML IJ SOLN
INTRAVENOUS | Status: DC | PRN
  Administered 2016-07-24: 25 ug/min via INTRAVENOUS

## 2016-07-24 MED ORDER — SENNOSIDES-DOCUSATE SODIUM 8.6-50 MG PO TABS: 1.0000 | ORAL_TABLET | Freq: Every evening | ORAL | Status: DC | PRN

## 2016-07-24 MED ORDER — LIDOCAINE 2% (20 MG/ML) 5 ML SYRINGE
INTRAMUSCULAR | Status: DC | PRN
  Administered 2016-07-24: 70 mg via INTRAVENOUS
  Administered 2016-07-24: 30 mg via INTRAVENOUS

## 2016-07-24 MED ORDER — ONDANSETRON HCL 4 MG/2ML IJ SOLN: 4.0000 mg | Freq: Four times a day (QID) | INTRAMUSCULAR | Status: DC | PRN

## 2016-07-24 MED ORDER — PROTAMINE SULFATE 10 MG/ML IV SOLN
INTRAVENOUS | Status: DC | PRN
  Administered 2016-07-24: 50 mg via INTRAVENOUS

## 2016-07-24 MED ORDER — MIDAZOLAM HCL 5 MG/5ML IJ SOLN
INTRAMUSCULAR | Status: DC | PRN
  Administered 2016-07-24: 2 mg via INTRAVENOUS

## 2016-07-24 MED ORDER — GUAIFENESIN-DM 100-10 MG/5ML PO SYRP: 15.0000 mL | ORAL_SOLUTION | ORAL | Status: DC | PRN

## 2016-07-24 MED ORDER — MORPHINE SULFATE (PF) 2 MG/ML IV SOLN: 1.0000 mg | INTRAVENOUS | Status: DC | PRN

## 2016-07-24 MED ORDER — PHENOL 1.4 % MT LIQD: 1.0000 | OROMUCOSAL | Status: DC | PRN

## 2016-07-24 MED ORDER — SODIUM CHLORIDE 0.9 % IV SOLN
INTRAVENOUS | Status: DC | PRN
  Administered 2016-07-24: 08:00:00 500 mL

## 2016-07-24 MED ORDER — PANTOPRAZOLE SODIUM 40 MG PO TBEC
40.0000 mg | DELAYED_RELEASE_TABLET | Freq: Every day | ORAL | Status: DC
  Administered 2016-07-25: 40 mg via ORAL
  Filled 2016-07-24: qty 1

## 2016-07-24 MED ORDER — ALUM & MAG HYDROXIDE-SIMETH 200-200-20 MG/5ML PO SUSP: 15.0000 mL | ORAL | Status: DC | PRN

## 2016-07-24 MED ORDER — ATORVASTATIN CALCIUM 40 MG PO TABS: 40.0000 mg | ORAL_TABLET | Freq: Every day | ORAL | Status: DC

## 2016-07-24 MED ORDER — ROCURONIUM BROMIDE 10 MG/ML (PF) SYRINGE
PREFILLED_SYRINGE | INTRAVENOUS | Status: DC | PRN
  Administered 2016-07-24: 50 mg via INTRAVENOUS

## 2016-07-24 MED ORDER — SUGAMMADEX SODIUM 200 MG/2ML IV SOLN
INTRAVENOUS | Status: DC | PRN
  Administered 2016-07-24: 200 mg via INTRAVENOUS

## 2016-07-24 MED ORDER — DOCUSATE SODIUM 100 MG PO CAPS
100.0000 mg | ORAL_CAPSULE | Freq: Every day | ORAL | Status: DC
  Administered 2016-07-25: 100 mg via ORAL
  Filled 2016-07-24: qty 1

## 2016-07-24 MED ORDER — FENTANYL CITRATE (PF) 100 MCG/2ML IJ SOLN: 25.0000 ug | INTRAMUSCULAR | Status: DC | PRN

## 2016-07-24 MED ORDER — FENTANYL CITRATE (PF) 250 MCG/5ML IJ SOLN
INTRAMUSCULAR | Status: DC | PRN
  Administered 2016-07-24: 100 ug via INTRAVENOUS
  Administered 2016-07-24 (×3): 50 ug via INTRAVENOUS

## 2016-07-24 MED ORDER — SODIUM CHLORIDE 0.9 % IV SOLN: INTRAVENOUS | Status: DC

## 2016-07-24 MED ORDER — ACETAMINOPHEN 325 MG PO TABS
325.0000 mg | ORAL_TABLET | ORAL | Status: DC | PRN
  Administered 2016-07-24: 650 mg via ORAL
  Filled 2016-07-24: qty 2

## 2016-07-24 MED ORDER — DEXAMETHASONE SODIUM PHOSPHATE 10 MG/ML IJ SOLN
INTRAMUSCULAR | Status: AC
  Filled 2016-07-24: qty 1

## 2016-07-24 MED ORDER — FENTANYL CITRATE (PF) 250 MCG/5ML IJ SOLN
INTRAMUSCULAR | Status: AC
  Filled 2016-07-24: qty 5

## 2016-07-24 MED ORDER — PROPOFOL 10 MG/ML IV BOLUS
INTRAVENOUS | Status: DC | PRN
  Administered 2016-07-24: 175 mg via INTRAVENOUS
  Administered 2016-07-24: 25 mg via INTRAVENOUS

## 2016-07-24 MED ORDER — ONDANSETRON HCL 4 MG/2ML IJ SOLN
INTRAMUSCULAR | Status: AC
  Filled 2016-07-24: qty 2

## 2016-07-24 MED ORDER — 0.9 % SODIUM CHLORIDE (POUR BTL) OPTIME
TOPICAL | Status: DC | PRN
  Administered 2016-07-24: 2000 mL

## 2016-07-24 MED ORDER — PROPOFOL 10 MG/ML IV BOLUS
INTRAVENOUS | Status: AC
  Filled 2016-07-24: qty 40

## 2016-07-24 MED ORDER — ENOXAPARIN SODIUM 30 MG/0.3ML ~~LOC~~ SOLN: 30.0000 mg | SUBCUTANEOUS | Status: DC

## 2016-07-24 MED ORDER — ONDANSETRON HCL 4 MG/2ML IJ SOLN
INTRAMUSCULAR | Status: DC | PRN
  Administered 2016-07-24: 4 mg via INTRAVENOUS

## 2016-07-24 MED ORDER — PROTAMINE SULFATE 10 MG/ML IV SOLN
INTRAVENOUS | Status: AC
  Filled 2016-07-24: qty 5

## 2016-07-24 MED ORDER — OXYCODONE HCL 5 MG/5ML PO SOLN: 5.0000 mg | Freq: Once | ORAL | Status: DC | PRN

## 2016-07-24 MED ORDER — ESMOLOL HCL 100 MG/10ML IV SOLN
INTRAVENOUS | Status: DC | PRN
  Administered 2016-07-24 (×2): 20 mg via INTRAVENOUS
  Administered 2016-07-24: 30 mg via INTRAVENOUS

## 2016-07-24 MED ORDER — HYDRALAZINE HCL 20 MG/ML IJ SOLN: 5.0000 mg | INTRAMUSCULAR | Status: DC | PRN

## 2016-07-24 MED ORDER — PHENYLEPHRINE 40 MCG/ML (10ML) SYRINGE FOR IV PUSH (FOR BLOOD PRESSURE SUPPORT)
PREFILLED_SYRINGE | INTRAVENOUS | Status: AC
  Filled 2016-07-24: qty 20

## 2016-07-24 SURGICAL SUPPLY — 41 items
CANISTER SUCT 3000ML PPV (MISCELLANEOUS) ×3 IMPLANT
CANNULA VESSEL 3MM 2 BLNT TIP (CANNULA) ×6 IMPLANT
CATH ROBINSON RED A/P 18FR (CATHETERS) ×3 IMPLANT
CLIP LIGATING EXTRA MED SLVR (CLIP) ×3 IMPLANT
CLIP LIGATING EXTRA SM BLUE (MISCELLANEOUS) ×3 IMPLANT
CRADLE DONUT ADULT HEAD (MISCELLANEOUS) ×3 IMPLANT
DECANTER SPIKE VIAL GLASS SM (MISCELLANEOUS) IMPLANT
DERMABOND ADVANCED (GAUZE/BANDAGES/DRESSINGS) ×2
DERMABOND ADVANCED .7 DNX12 (GAUZE/BANDAGES/DRESSINGS) ×1 IMPLANT
DRAIN HEMOVAC 1/8 X 5 (WOUND CARE) IMPLANT
ELECT REM PT RETURN 9FT ADLT (ELECTROSURGICAL) ×3
ELECTRODE REM PT RTRN 9FT ADLT (ELECTROSURGICAL) ×1 IMPLANT
EVACUATOR SILICONE 100CC (DRAIN) IMPLANT
GLOVE BIOGEL PI IND STRL 6.5 (GLOVE) ×2 IMPLANT
GLOVE BIOGEL PI INDICATOR 6.5 (GLOVE) ×4
GLOVE ECLIPSE 6.5 STRL STRAW (GLOVE) ×3 IMPLANT
GLOVE ECLIPSE 7.0 STRL STRAW (GLOVE) ×3 IMPLANT
GLOVE SS BIOGEL STRL SZ 7.5 (GLOVE) ×1 IMPLANT
GLOVE SUPERSENSE BIOGEL SZ 7.5 (GLOVE) ×2
GLOVE SURG SS PI 6.5 STRL IVOR (GLOVE) ×3 IMPLANT
GOWN STRL REUS W/ TWL LRG LVL3 (GOWN DISPOSABLE) ×3 IMPLANT
GOWN STRL REUS W/TWL LRG LVL3 (GOWN DISPOSABLE) ×6
KIT BASIN OR (CUSTOM PROCEDURE TRAY) ×3 IMPLANT
KIT ROOM TURNOVER OR (KITS) ×3 IMPLANT
KIT SHUNT ARGYLE CAROTID ART 6 (VASCULAR PRODUCTS) IMPLANT
NEEDLE 22X1 1/2 (OR ONLY) (NEEDLE) IMPLANT
NS IRRIG 1000ML POUR BTL (IV SOLUTION) ×6 IMPLANT
PACK CAROTID (CUSTOM PROCEDURE TRAY) ×3 IMPLANT
PAD ARMBOARD 7.5X6 YLW CONV (MISCELLANEOUS) ×6 IMPLANT
PATCH HEMASHIELD 8X75 (Vascular Products) ×3 IMPLANT
SHUNT CAROTID BYPASS 10 (VASCULAR PRODUCTS) ×3 IMPLANT
SHUNT CAROTID BYPASS 12FRX15.5 (VASCULAR PRODUCTS) IMPLANT
SUT ETHILON 3 0 PS 1 (SUTURE) IMPLANT
SUT PROLENE 6 0 CC (SUTURE) ×3 IMPLANT
SUT SILK 3 0 (SUTURE)
SUT SILK 3-0 18XBRD TIE 12 (SUTURE) IMPLANT
SUT VIC AB 3-0 SH 27 (SUTURE) ×4
SUT VIC AB 3-0 SH 27X BRD (SUTURE) ×2 IMPLANT
SUT VICRYL 4-0 PS2 18IN ABS (SUTURE) ×3 IMPLANT
SYR CONTROL 10ML LL (SYRINGE) IMPLANT
WATER STERILE IRR 1000ML POUR (IV SOLUTION) ×3 IMPLANT

## 2016-07-24 NOTE — Anesthesia Postprocedure Evaluation (Signed)
Anesthesia Post Note  Patient: Samuel Serrano  Procedure(s) Performed: Procedure(s) (LRB): Left Carotid ENDARTERECTOMY (Left) PATCH ANGIOPLASTY (Left)     Patient location during evaluation: PACU Anesthesia Type: General Level of consciousness: awake and alert and patient cooperative Pain management: pain level controlled Vital Signs Assessment: post-procedure vital signs reviewed and stable Respiratory status: spontaneous breathing and respiratory function stable Cardiovascular status: stable Anesthetic complications: no    Last Vitals:  Vitals:   07/24/16 1311 07/24/16 1331  BP:  120/62  Pulse: 65   Resp: 14 14  Temp:      Last Pain:  Vitals:   07/24/16 1305  TempSrc:   PainSc: 0-No pain                 Wadsworth Skolnick S

## 2016-07-24 NOTE — Progress Notes (Signed)
Pt ambulated in hall about 119ft this pm. RA. Tolerated well. Pt put back in bed, will continue to monitor.  Jaymes Graff, RN

## 2016-07-24 NOTE — Telephone Encounter (Signed)
Sched appt 08/19/16 at 4:00. Lm on hm#.

## 2016-07-24 NOTE — Telephone Encounter (Signed)
-----   Message from Mena Goes, RN sent at 07/24/2016  1:33 PM EDT ----- Regarding: may be a duplicate   ----- Message ----- From: Ulyses Amor, PA-C Sent: 07/24/2016  12:23 PM To: Vvs Charge Pool  F/U with Dr. Donnetta Hutching 2-3 weeks s/p CEA

## 2016-07-24 NOTE — Progress Notes (Signed)
Care of pt assumed by MA Vidalia Serpas RN 

## 2016-07-24 NOTE — Progress Notes (Signed)
Pt arrived to 4E17. Pt hooked up to A-line and frequent vitals. No complaints of pain. Call bell and phone within reach. Will continue to monitor.

## 2016-07-24 NOTE — Interval H&P Note (Signed)
History and Physical Interval Note:  07/24/2016 7:26 AM  Samuel Serrano  has presented today for surgery, with the diagnosis of Left Internal Carotid Artery Stenosis  I65.22  The various methods of treatment have been discussed with the patient and family. After consideration of risks, benefits and other options for treatment, the patient has consented to  Procedure(s): ENDARTERECTOMY CAROTID (Left) as a surgical intervention .  The patient's history has been reviewed, patient examined, no change in status, stable for surgery.  I have reviewed the patient's chart and labs.  Questions were answered to the patient's satisfaction.     Curt Jews

## 2016-07-24 NOTE — Progress Notes (Signed)
Pt unable to void. Bladder scan showed >820. In and out cath preformed. Pt tolerated well.   Pt sat on side of bed. Immediately became nauseous. Meds given. Call bell and phone within reach., Wife at bedside. Will continue to monitor.

## 2016-07-24 NOTE — Anesthesia Procedure Notes (Signed)
Procedure Name: Intubation Date/Time: 07/24/2016 7:47 AM Performed by: Mervyn Gay Pre-anesthesia Checklist: Patient identified, Patient being monitored, Timeout performed, Emergency Drugs available and Suction available Patient Re-evaluated:Patient Re-evaluated prior to induction Oxygen Delivery Method: Circle System Utilized Preoxygenation: Pre-oxygenation with 100% oxygen Induction Type: IV induction Ventilation: Mask ventilation without difficulty and Oral airway inserted - appropriate to patient size Laryngoscope Size: Miller and 3 Grade View: Grade II Tube type: Oral Tube size: 8.0 mm Number of attempts: 1 Airway Equipment and Method: Stylet Placement Confirmation: ETT inserted through vocal cords under direct vision,  positive ETCO2 and breath sounds checked- equal and bilateral Secured at: 24 cm Tube secured with: Tape Dental Injury: Teeth and Oropharynx as per pre-operative assessment

## 2016-07-24 NOTE — Anesthesia Preprocedure Evaluation (Signed)
Anesthesia Evaluation  Patient identified by MRN, date of birth, ID band Patient awake    Reviewed: Allergy & Precautions, H&P , NPO status , Patient's Chart, lab work & pertinent test results  Airway Mallampati: II   Neck ROM: full    Dental   Pulmonary Current Smoker,    breath sounds clear to auscultation       Cardiovascular + Peripheral Vascular Disease   Rhythm:regular Rate:Normal     Neuro/Psych    GI/Hepatic   Endo/Other  obese  Renal/GU      Musculoskeletal   Abdominal   Peds  Hematology   Anesthesia Other Findings   Reproductive/Obstetrics                             Anesthesia Physical Anesthesia Plan  ASA: II  Anesthesia Plan: General   Post-op Pain Management:    Induction: Intravenous  PONV Risk Score and Plan: 1 and Ondansetron, Dexamethasone, Midazolam and Treatment may vary due to age or medical condition  Airway Management Planned: Oral ETT  Additional Equipment: Arterial line  Intra-op Plan:   Post-operative Plan: Extubation in OR  Informed Consent: I have reviewed the patients History and Physical, chart, labs and discussed the procedure including the risks, benefits and alternatives for the proposed anesthesia with the patient or authorized representative who has indicated his/her understanding and acceptance.     Plan Discussed with: CRNA, Anesthesiologist and Surgeon  Anesthesia Plan Comments:         Anesthesia Quick Evaluation

## 2016-07-24 NOTE — Op Note (Signed)
    OPERATIVE REPORT  DATE OF SURGERY: 07/24/2016  PATIENT: Samuel Serrano, 66 y.o. male MRN: [id number]  DOB: 1950-11-30  PRE-OPERATIVE DIAGNOSIS: Severe symptomatic left internal carotid artery stenosis  POST-OPERATIVE DIAGNOSIS:  Same  PROCEDURE: Left carotid endarterectomy and Dacron patch angioplasty  SURGEON:  Curt Jews, M.D.  PHYSICIAN ASSISTANT: Gerri Lins PA-C  ANESTHESIA:  Gen.  EBL: Minimal ml  Total I/O In: 1300 [I.V.:1300] Out: 30 [Blood:30]  BLOOD ADMINISTERED: None  DRAINS: None  SPECIMEN: None  COUNTS CORRECT:  YES  PLAN OF CARE: PACU   PATIENT DISPOSITION:  PACU - hemodynamically stable  PROCEDURE DETAILS: The patient was taken to the operating room placed supine position where the area of the neck was prepped and draped in usual sterile fashion. Incision was made anterior to sternocleidomastoid and carried down through the platysma with electrocautery. The sternocleidomastoid was reflected posteriorly and the carotid sheath was opened. The facial vein was ligated with 2-0 silk ties and divided. The vagus nerve was identified and preserved. The patient had a relatively low carotid bifurcation. The common carotid artery was encircled with an umbilical Telfa and Rummel tourniquet. The superior third artery was encircled with a 2-0 silk Potts tie. The external carotid was encircled with a blue vessel loop internal carotid was encircled with tape and Rummel tourniquet. The patient was given 10,000 units of intravenous heparin. After adequate circulation time the internal, external and common carotid arteries were occluded. The common carotid artery was opened with 11 blade and extended longitudinally with Potts scissors onto the deep. A 10 shunt was passed up the internal carotid and allowed to back bleed and down the common carotid endarterectomy was given on the common carotid artery and the plaque was divided proximally with Potts scissors. The endarterectomy  was continued onto the bifurcation. The external carotid was endarterectomized with an eversion technique and the internal carotid was endarterectomized in an open fashion. Remaining atheromatous debris was removed from the endarterectomy plane.-As Hemashield Dacron patch was brought onto the field and was sewn as a patch angioplasty with a running 6-0 Prolene suture. Prior to completion of the closure the shunt was removed and the usual flushing maneuvers were undertaken. The anastomosis was completed and the first of the external and then the internal carotid arteries. Excellent flow characteristics were noted with hand-held Doppler in the internal and external carotid arteries. The patient was given 50 mg of protamine to reverse the heparin. Wounds irrigated with saline. Hemostasis tablet cautery. Wounds were closed with 3-0 Vicryl to reapproximate the sternocleidomastoid over the carotid sheath. The platysma was closed a running 3-0 Vicryl suture and find the skin was closed with 4 septic or Vicryl suture. Sterile dressing was applied the patient was transferred to the recovery room in stable condition neurologically intact   Samuel Serrano, M.D., [facility] [place] [place] 07/24/2016 10:23 AM

## 2016-07-24 NOTE — Anesthesia Procedure Notes (Signed)
Arterial Line Insertion Start/End7/13/2018 7:00 AM Performed by: Mervyn Gay, CRNA  Patient location: Pre-op. Preanesthetic checklist: patient identified, IV checked, site marked, risks and benefits discussed, surgical consent, monitors and equipment checked, pre-op evaluation, timeout performed and anesthesia consent Lidocaine 1% used for infiltration Right, radial was placed Catheter size: 20 Fr Hand hygiene performed  and maximum sterile barriers used   Attempts: 1 Procedure performed without using ultrasound guided technique. Following insertion, dressing applied and Biopatch. Post procedure assessment: normal and unchanged  Patient tolerated the procedure well with no immediate complications.

## 2016-07-24 NOTE — Progress Notes (Signed)
  [facility] Day of Surgery Note  Subjective:  Having mild headache.  Vitals:   07/24/16 1311 07/24/16 1331  BP:  120/62  Pulse: 65   Resp: 14 14  Temp:     Left neck incision without hematoma Tongue midline. No facial droop. 5/5 strength upper and lower extremities bilaterally.   Assessment/Plan:  This is a 66 y.o. male who is s/p left carotid endarterectomy  Stable post-op. Neuro intact.  Having some urinary retention. If bladder scan reveals >500cc, in and out x 1 If still unable to void after in and out, place foley catheter.     Virgina Jock, [place] Pager: [phone] 07/24/2016 4:21 PM

## 2016-07-24 NOTE — H&P (View-Only) (Signed)
[facility] of [facility]  Patient name: Samuel Serrano MRN: [id number] DOB: [birth year] Sex: male  REASON FOR CONSULT: Symptomatic left carotid stenosis  HPI: Samuel Serrano is a 66 y.o. male, who is here today for evaluation. Was seen in emergency department 2 days ago with no clear-cut amaurosis fugax. He reports a dating back for the proximally one year he has had approximate 5 different episodes of this. In one CT angiogram showing critical stenosis in his left internal carotid artery. All of his visual changes have been his left eye. He did see ophthalmologist on 07/17/2016 and his notes indicate 2 separate: Hours plaque in his left eye. He cyst. Denies any prior focal deficits such as unilateral weakness or slurred speech. He is right handed. He denies any cardiac difficulty.  No past medical history on file.  No family history on file.  SOCIAL HISTORY: Social History   Social History  . Marital status: Married    Spouse name: N/A  . Number of children: N/A  . Years of education: N/A   Occupational History  . Not on file.   Social History Main Topics  . Smoking status: Current Every Day Smoker    Packs/day: 1.40    Types: Cigarettes  . Smokeless tobacco: Never Used     Comment: 1 1/4 pk per day  . Alcohol use Yes     Comment: occasional  . Drug use: No  . Sexual activity: Not on file   Other Topics Concern  . Not on file   Social History Narrative  . No narrative on file    No Known Allergies  Current Outpatient Prescriptions  Medication Sig Dispense Refill  . aspirin 81 MG chewable tablet Chew 1 tablet (81 mg total) by mouth daily. 30 tablet 0  . atorvastatin (LIPITOR) 40 MG tablet Take 1 tablet (40 mg total) by mouth daily. 30 tablet 0  . clopidogrel (PLAVIX) 75 MG tablet Take 1 tablet (75 mg total) by mouth daily. 30 tablet 0  . Pseudoeph-Doxylamine-DM-APAP (NYQUIL PO) Take 45 mLs by mouth at bedtime.     No  current facility-administered medications for this visit.     REVIEW OF SYSTEMS:  [X]  denotes positive finding, [ ]  denotes negative finding Cardiac  Comments:  Chest pain or chest pressure:    Shortness of breath upon exertion:    Short of breath when lying flat:    Irregular heart rhythm:        Vascular    Pain in calf, thigh, or hip brought on by ambulation:    Pain in feet at night that wakes you up from your sleep:     Blood clot in your veins:    Leg swelling:         Pulmonary    Oxygen at home:    Productive cough:     Wheezing:         Neurologic    Sudden weakness in arms or legs:     Sudden numbness in arms or legs:     Sudden onset of difficulty speaking or slurred speech:    Temporary loss of vision in one eye:  x   Problems with dizziness:         Gastrointestinal    Blood in stool:     Vomited blood:         Genitourinary    Burning when urinating:     Blood in urine:  Psychiatric    Major depression:         Hematologic    Bleeding problems:    Problems with blood clotting too easily:        Skin    Rashes or ulcers:        Constitutional    Fever or chills:      PHYSICAL EXAM: Vitals:   07/21/16 1134 07/21/16 1143  BP: (!) 154/97 (!) 150/90  Pulse: 68   Resp: 16   Temp: (!) 97.4 F (36.3 C)   TempSrc: Oral   SpO2: 96%   Weight: 257 lb (116.6 kg)   Height: 6\' 1"  (1.854 m)     GENERAL: The patient is a well-nourished male, in no acute distress. The vital signs are documented above. CARDIOVASCULAR: Carotid arteries are bruits bilaterally. 2+ radial and 2+ dorsalis pedis pulses bilaterally PULMONARY: There is good air exchange  ABDOMEN: Soft and non-tender  MUSCULOSKELETAL: There are no major deformities or cyanosis. NEUROLOGIC: No focal weakness or paresthesias are detected. SKIN: There are no ulcers or rashes noted. PSYCHIATRIC: The patient has a normal affect.  DATA:  CT angiogram from Hospital was reviewed and  discussed with the patient and his wife present. This does show critical stenosis in his left internal carotid artery the bifurcation. He does have a relatively hypoperfused internal carotid artery distal to the bifurcation.  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 Explained this is certainly the most likely source and recommended endarterectomy for reduction of stroke risk. I explained the procedure including risk of stroke with the procedure. Also explained my concern with his small hypoperfused internal artery just the bifurcation. Explained this typically is simply related to low pressure flow and not a sclerotic artery. He understands and wished to proceed with surgery as possible as possible. Schedule this for 07/24/2016   Rosetta Posner, MD [facility] of [facility] Tel [phone] Pager [phone]

## 2016-07-24 NOTE — Transfer of Care (Signed)
Immediate Anesthesia Transfer of Care Note  Patient: Samuel Serrano  Procedure(s) Performed: Procedure(s): Left Carotid ENDARTERECTOMY (Left) PATCH ANGIOPLASTY (Left)  Patient Location: PACU  Anesthesia Type:General  Level of Consciousness: awake, alert , oriented and patient cooperative  Airway & Oxygen Therapy: Patient Spontanous Breathing and Patient connected to face mask oxygen  Post-op Assessment: Report given to RN, Post -op Vital signs reviewed and stable and Patient moving all extremities X 4  Post vital signs: Reviewed and stable  Last Vitals:  Vitals:   07/24/16 0545  BP: (!) 142/79  Pulse: 72  Resp: 18  Temp: 36.5 C    Last Pain:  Vitals:   07/24/16 0545  TempSrc: Oral         Complications: No apparent anesthesia complications

## 2016-07-25 LAB — CBC
HCT: 36.4 % — ABNORMAL LOW (ref 39.0–52.0)
HEMOGLOBIN: 12.2 g/dL — AB (ref 13.0–17.0)
MCH: 31.1 pg (ref 26.0–34.0)
MCHC: 33.5 g/dL (ref 30.0–36.0)
MCV: 92.9 fL (ref 78.0–100.0)
PLATELETS: 162 10*3/uL (ref 150–400)
RBC: 3.92 MIL/uL — AB (ref 4.22–5.81)
RDW: 12.8 % (ref 11.5–15.5)
WBC: 11.4 10*3/uL — AB (ref 4.0–10.5)

## 2016-07-25 LAB — BASIC METABOLIC PANEL
ANION GAP: 8 (ref 5–15)
BUN: 12 mg/dL (ref 6–20)
CHLORIDE: 105 mmol/L (ref 101–111)
CO2: 24 mmol/L (ref 22–32)
Calcium: 8.9 mg/dL (ref 8.9–10.3)
Creatinine, Ser: 0.91 mg/dL (ref 0.61–1.24)
GFR calc Af Amer: >60 mL/min (ref >60)
Glucose, Bld: 136 mg/dL — ABNORMAL HIGH (ref 65–99)
POTASSIUM: 4 mmol/L (ref 3.5–5.1)
Sodium: 137 mmol/L (ref 135–145)

## 2016-07-25 MED ORDER — CLOPIDOGREL BISULFATE 75 MG PO TABS: 75.0000 mg | ORAL_TABLET | Freq: Every day | ORAL | 1 refills | Status: DC

## 2016-07-25 MED ORDER — ATORVASTATIN CALCIUM 40 MG PO TABS: 40.0000 mg | ORAL_TABLET | Freq: Every day | ORAL | 11 refills | Status: DC

## 2016-07-25 NOTE — Plan of Care (Signed)
Problem: Activity: Goal: Risk for activity intolerance will decrease Outcome: Progressing Pt walked 140 ft this pm. Also walked to bathroom x2 overnight. Tolerated activity well.

## 2016-07-25 NOTE — Progress Notes (Signed)
Assumed care from off going RN @ 0710; patient up in chair; alert & oriented; incision dry and intact; no signs of bleeding; patient A-line out; patient given dicharge papers; out in cheelchair with wife

## 2016-07-25 NOTE — Progress Notes (Addendum)
  Progress Note  SUBJECTIVE:    POD #1  No complaints this am.   OBJECTIVE:   Vitals:   07/25/16 0200 07/25/16 0300  BP:    Pulse:    Resp: (!) 22 13  Temp:  97.6 F (36.4 C)    Intake/Output Summary (Last 24 hours) at 07/25/16 0750 Last data filed at 07/25/16 0600  Gross per 24 hour  Intake             1750 ml  Output             1030 ml  Net              720 ml   Left neck without hematoma. Some numbness around incision and left jaw. No smile asymmetry. No tongue deviation. 5/5 strength upper and lower extremities bilaterally.   ASSESSMENT/PLAN:   66 y.o. male is s/p: left carotid endarterectomy 1 Day Post-Op   Neuro exam intact.  Had some urinary retention last night requiring in and out cath. Resolved this am.  Tolerating diet well. Has ambulated.  On ASA, plavix and statin. Advised patient to establish with PCP as he wants to try wellbutrin for smoking cessation.  D/c home today.   Alvia Grove 07/25/2016 7:50 AM -- LABS:   CBC    Component Value Date/Time   WBC 11.4 (H) 07/25/2016 0315   HGB 12.2 (L) 07/25/2016 0315   HCT 36.4 (L) 07/25/2016 0315   PLT 162 07/25/2016 0315    BMET    Component Value Date/Time   NA 137 07/25/2016 0315   K 4.0 07/25/2016 0315   CL 105 07/25/2016 0315   CO2 24 07/25/2016 0315   GLUCOSE 136 (H) 07/25/2016 0315   BUN 12 07/25/2016 0315   CREATININE 0.91 07/25/2016 0315   CALCIUM 8.9 07/25/2016 0315   GFRNONAA >60 07/25/2016 0315   GFRAA >60 07/25/2016 0315    COAG Lab Results  Component Value Date   INR 1.05 07/22/2016   No results found for: PTT  ANTIBIOTICS:   Anti-infectives    Start     Dose/Rate Route Frequency Ordered Stop   07/24/16 1830  cefUROXime (ZINACEF) 1.5 g in dextrose 5 % 50 mL IVPB     1.5 g 100 mL/hr over 30 Minutes Intravenous Every 12 hours 07/24/16 1329 07/25/16 0624   07/24/16 0556  cefUROXime (ZINACEF) 1.5 g in dextrose 5 % 50 mL IVPB     1.5 g 100 mL/hr over 30 Minutes  Intravenous 30 min pre-op 07/24/16 0556 07/24/16 0900       Virgina Jock, PA-C [facility] Office: [phone] Pager: [phone] 07/25/2016 7:50 AM   I have interviewed patient with PA and agree with assessment and plan above.   Gussie Towson C. Donzetta Matters, MD [facility] of [place] Office: [phone] Pager: [phone]

## 2016-07-27 ENCOUNTER — Encounter (HOSPITAL_COMMUNITY): Payer: Self-pay | Admitting: Vascular Surgery

## 2016-07-28 NOTE — Discharge Summary (Signed)
[facility] Discharge Summary  Samuel Serrano 1950/11/15 66 y.o. male  [id number]  Admission Date: 07/24/2016  Discharge Date: 07/25/2016  Physician: Curt Jews, MD  Admission Diagnosis: Carotid stenosis [I65.29]  HPI:   This is a 66 y.o. male who was seen in emergency department on 07/17/16 with clear-cut amaurosis fugax. He reports a dating back for the proximally one year he has had approximate 5 different episodes of this. In one CT angiogram showing critical stenosis in his left internal carotid artery. All of his visual changes have been his left eye. He did see ophthalmologist on 07/17/2016 and his notes indicate 2 separate: Hours plaque in his left eye. He cyst. Denies any prior focal deficits such as unilateral weakness or slurred speech. He is right handed. He denies any cardiac difficulty.  Hospital Course:  The patient was admitted to the hospital and taken to the operating room on 07/24/2016 and underwent left carotid endarterectomy.  The patient tolerated the procedure well and was transported to the PACU in stable condition.  By POD 1, the patient's neuro status was intact. His left neck incision was without hematoma. He was tolerating a diet, ambulating and voiding without difficulty. He was discharged home on POD 1 in good condition.    No results for input(s): NA, K, CL, CO2, GLUCOSE, BUN, CALCIUM in the last 72 hours.  Invalid input(s): CRATININE No results for input(s): WBC, HGB, HCT, PLT in the last 72 hours. No results for input(s): INR in the last 72 hours.  Discharge Instructions:   The patient is discharged to home with extensive instructions on wound care and progressive ambulation.  They are instructed not to drive or perform any heavy lifting until returning to see the physician in his office.  Discharge Instructions    CAROTID Sugery: Call MD for difficulty swallowing or speaking; weakness in arms or legs that is a new symtom; severe  headache.  If you have increased swelling in the neck and/or  are having difficulty breathing, CALL [phone]    Complete by:  As directed    Call MD for:  redness, tenderness, or signs of infection (pain, swelling, bleeding, redness, odor or green/yellow discharge around incision site)    Complete by:  As directed    Call MD for:  redness, tenderness, or signs of infection (pain, swelling, bleeding, redness, odor or green/yellow discharge around incision site)    Complete by:  As directed    Call MD for:  severe or increased pain, loss or decreased feeling  in affected limb(s)    Complete by:  As directed    Call MD for:  severe or increased pain, loss or decreased feeling  in affected limb(s)    Complete by:  As directed    Call MD for:  temperature >100.5    Complete by:  As directed    Call MD for:  temperature >100.5    Complete by:  As directed    Discharge instructions    Complete by:  As directed    You may shower in 24 hours.  Gradually return to everyday activity.   Discharge wound care:    Complete by:  As directed    Shower daily. You can get your incision wet with soap and water. Pat incision dry. Do not scrub incision. Do not peel off the skin glue, it will come off on its own.   Driving Restrictions    Complete by:  As directed  No driving for 1 week   Driving Restrictions    Complete by:  As directed    No driving for 1 week   Increase activity slowly    Complete by:  As directed    Increase activity as tolerated. Walk daily as tolerated.   Lifting restrictions    Complete by:  As directed    No heavy lifting for 2-3 weeks   Lifting restrictions    Complete by:  As directed    No heavy lifting for 2 weeks   Resume previous diet    Complete by:  As directed    Resume previous diet    Complete by:  As directed       Discharge Diagnosis:  Carotid stenosis [I65.29]  Secondary Diagnosis: Patient Active Problem List   Diagnosis Date Noted  . Carotid stenosis  07/24/2016   Past Medical History:  Diagnosis Date  . Peripheral vascular disease (HCC)    left carotid stenosis    Allergies as of 07/25/2016      Reactions   No Known Allergies       Medication List    TAKE these medications   aspirin 81 MG chewable tablet Chew 1 tablet (81 mg total) by mouth daily.   atorvastatin 40 MG tablet Commonly known as:  LIPITOR Take 1 tablet (40 mg total) by mouth daily.   clopidogrel 75 MG tablet Commonly known as:  PLAVIX Take 1 tablet (75 mg total) by mouth daily.   NYQUIL PO Take 45 mLs by mouth at bedtime.   oxyCODONE-acetaminophen 5-325 MG tablet Commonly known as:  PERCOCET/ROXICET Take 1 tablet by mouth every 6 (six) hours as needed.       Percocet #8 No Refill  Disposition: Home  Patient's condition: is Good  Follow up: 1. Dr.  Donnetta Hutching in 2 weeks.   Virgina Jock, PA-C [facility] [phone]  --- For [facility] use --- Instructions: Press F2 to tab through selections.  Delete question if not applicable.   Modified Rankin score at D/C (0-6): 0  IV medication needed for:  1. Hypertension: No 2. Hypotension: No  Post-op Complications: No  1. Post-op CVA or TIA: No  2. CN injury: No  3. Myocardial infarction: No   4.  CHF: No  5.  Dysrhythmia (new): No  6. Wound infection: No  7. Reperfusion symptoms: No  8. Return to OR: No  Discharge medications: Statin use:  Yes If No: [ ]  For Medical reasons, [ ]  Non-compliant, [ ]  Not-indicated ASA use:  Yes  If No: [ ]  For Medical reasons, [ ]  Non-compliant, [ ]  Not-indicated Beta blocker use:  No If No: [ ]  For Medical reasons, [ ]  Non-compliant, [x ] Not-indicated ACE-Inhibitor use:  No If No: [ ]  For Medical reasons, [ ]  Non-compliant, [x ] Not-indicated P2Y12 Antagonist use: Yes, [x ] Plavix, [ ]  Plasugrel, [ ]  Ticlopinine, [ ]  Ticagrelor, [ ]  Other, [ ]  No for medical reason, [ ]  Non-compliant, [ ]  Not-indicated Anti-coagulant use:  No,  [ ]  Warfarin, [ ]  Rivaroxaban, [ ]  Dabigatran, [ ]  Other, [ ]  No for medical reason, [ ]  Non-compliant, [x ] Not-indicated

## 2016-08-04 ENCOUNTER — Encounter: Payer: Self-pay | Admitting: Vascular Surgery

## 2016-08-19 ENCOUNTER — Ambulatory Visit (INDEPENDENT_AMBULATORY_CARE_PROVIDER_SITE_OTHER): Payer: Self-pay | Admitting: Vascular Surgery

## 2016-08-19 VITALS — BP 115/79 | HR 84 | Temp 97.8°F | Resp 20 | Ht 73.0 in | Wt 251.0 lb

## 2016-08-19 DIAGNOSIS — I6522 Occlusion and stenosis of left carotid artery: Secondary | ICD-10-CM

## 2016-08-20 ENCOUNTER — Encounter: Payer: Self-pay | Admitting: Vascular Surgery

## 2016-08-20 NOTE — Progress Notes (Signed)
   Patient name: Samuel Serrano MRN: [id number] DOB: 03/30/1950 Sex: male  REASON FOR VISIT: Follow-up left carotid endarterectomy  HPI: Samuel Serrano is a 66 y.o. male here today for follow-up of left carotid endarterectomy for symptomatic carotid disease. He had preoperative visual changes. He is quite well since his surgery and was discharged home on postoperative day 1. His had no neurologic abscess. His had minimal discomfort in the usual. Incisional numbness.  Current Outpatient Prescriptions  Medication Sig Dispense Refill  . aspirin 81 MG chewable tablet Chew 1 tablet (81 mg total) by mouth daily. 30 tablet 0  . atorvastatin (LIPITOR) 40 MG tablet Take 1 tablet (40 mg total) by mouth daily. 30 tablet 11  . clopidogrel (PLAVIX) 75 MG tablet Take 1 tablet (75 mg total) by mouth daily. 30 tablet 1  . oxyCODONE-acetaminophen (PERCOCET/ROXICET) 5-325 MG tablet Take 1 tablet by mouth every 6 (six) hours as needed. 6 tablet 0  . Pseudoeph-Doxylamine-DM-APAP (NYQUIL PO) Take 45 mLs by mouth at bedtime.     No current facility-administered medications for this visit.      PHYSICAL EXAM: There were no vitals filed for this visit.  GENERAL: The patient is a well-nourished male, in no acute distress. The vital signs are documented above. Neck incision is well-healed. Neurologically he is intact  MEDICAL ISSUES: Stable following left carotid surgery. Will resume full activities. We will see him again in 6 months for continued carotid follow-up.   Rosetta Posner, MD FACS [facility] of [facility] Tel [phone] Pager [phone]

## 2016-08-26 NOTE — Addendum Note (Signed)
Addended by: Lianne Cure A on: 08/26/2016 12:49 PM   Modules accepted: Orders

## 2016-10-09 ENCOUNTER — Other Ambulatory Visit: Payer: Self-pay | Admitting: Vascular Surgery

## 2016-10-09 DIAGNOSIS — I739 Peripheral vascular disease, unspecified: Secondary | ICD-10-CM

## 2016-10-09 DIAGNOSIS — I6529 Occlusion and stenosis of unspecified carotid artery: Secondary | ICD-10-CM

## 2016-10-09 MED ORDER — CLOPIDOGREL BISULFATE 75 MG PO TABS: 75.0000 mg | ORAL_TABLET | Freq: Every day | ORAL | 1 refills | Status: DC

## 2016-10-12 ENCOUNTER — Other Ambulatory Visit: Payer: Self-pay

## 2016-10-12 DIAGNOSIS — I739 Peripheral vascular disease, unspecified: Secondary | ICD-10-CM

## 2016-10-12 DIAGNOSIS — I6529 Occlusion and stenosis of unspecified carotid artery: Secondary | ICD-10-CM

## 2016-10-12 MED ORDER — CLOPIDOGREL BISULFATE 75 MG PO TABS: 75.0000 mg | ORAL_TABLET | Freq: Every day | ORAL | 3 refills | Status: DC

## 2017-02-23 ENCOUNTER — Other Ambulatory Visit: Payer: Self-pay

## 2017-02-23 ENCOUNTER — Ambulatory Visit (HOSPITAL_COMMUNITY)
Admission: RE | Admit: 2017-02-23 | Discharge: 2017-02-23 | Disposition: A | Discharge status: Home / Self Care | Payer: Medicare Other | Source: Ambulatory Visit | Attending: Vascular Surgery | Admitting: Vascular Surgery

## 2017-02-23 ENCOUNTER — Ambulatory Visit (INDEPENDENT_AMBULATORY_CARE_PROVIDER_SITE_OTHER): Payer: Medicare Other | Admitting: Vascular Surgery

## 2017-02-23 ENCOUNTER — Encounter: Payer: Self-pay | Admitting: Vascular Surgery

## 2017-02-23 VITALS — BP 141/88 | HR 74 | Temp 97.4°F | Resp 18 | Ht 73.0 in | Wt 257.0 lb

## 2017-02-23 DIAGNOSIS — I6521 Occlusion and stenosis of right carotid artery: Secondary | ICD-10-CM | POA: Insufficient documentation

## 2017-02-23 DIAGNOSIS — I6522 Occlusion and stenosis of left carotid artery: Secondary | ICD-10-CM | POA: Diagnosis not present

## 2017-02-23 NOTE — Progress Notes (Signed)
Vitals:   02/23/17 1138 02/23/17 1143  BP: 139/85 (!) 142/88  Pulse: 72 74  Resp: 18   Temp: (!) 97.4 F (36.3 C)   TempSrc: Oral   SpO2: 96%   Weight: 257 lb (116.6 kg)   Height: 6\' 1"  (1.854 m)

## 2017-02-23 NOTE — Progress Notes (Signed)
Vascular and Vein Specialist of [facility]  Patient name: Samuel Serrano MRN: [id number] DOB: 27-Oct-1950 Sex: male  REASON FOR VISIT: Follow-up left carotid endarterectomy for symptomatic disease on 07/24/2016  HPI: Samuel Serrano is a 67 y.o. male here today for follow-up.  He had visual changes in his left eye and underwent left carotid endarterectomy on 07/24/2016.  He had uneventful recovery.  He is here today for follow-up.  He has had no new visual changes and no hemispheric symptoms.  There is no cardiac disease.  He does report some persistent.  Incisional numbness  Past Medical History:  Diagnosis Date  . Carotid artery occlusion   . Peripheral vascular disease (HCC)    left carotid stenosis    Family History  Problem Relation Age of Onset  . Breast cancer Mother   . Diabetes Father   . Hypertension Father     SOCIAL HISTORY: Social History   Tobacco Use  . Smoking status: Current Every Day Smoker    Packs/day: 0.00    Types: Cigarettes  . Smokeless tobacco: Never Used  . Tobacco comment: 1 1/4 pk per day  Substance Use Topics  . Alcohol use: Yes    Comment: occasional    Allergies  Allergen Reactions  . No Known Allergies     Current Outpatient Medications  Medication Sig Dispense Refill  . aspirin 81 MG chewable tablet Chew 1 tablet (81 mg total) by mouth daily. 30 tablet 0  . atorvastatin (LIPITOR) 40 MG tablet Take 1 tablet (40 mg total) by mouth daily. 30 tablet 11  . BuPROPion HCl (WELLBUTRIN PO) Take by mouth.    . clopidogrel (PLAVIX) 75 MG tablet Take 1 tablet (75 mg total) by mouth daily. 30 tablet 3  . Pseudoeph-Doxylamine-DM-APAP (NYQUIL PO) Take 45 mLs by mouth at bedtime.    Marland Kitchen oxyCODONE-acetaminophen (PERCOCET/ROXICET) 5-325 MG tablet Take 1 tablet by mouth every 6 (six) hours as needed. (Patient not taking: Reported on 08/20/2016) 6 tablet 0  . pravastatin (PRAVACHOL) 40 MG tablet Take by mouth.     No current  facility-administered medications for this visit.     REVIEW OF SYSTEMS:  [X]  denotes positive finding, [ ]  denotes negative finding Cardiac  Comments:  Chest pain or chest pressure:    Shortness of breath upon exertion:    Short of breath when lying flat:    Irregular heart rhythm:        Vascular    Pain in calf, thigh, or hip brought on by ambulation:    Pain in feet at night that wakes you up from your sleep:     Blood clot in your veins:    Leg swelling:           PHYSICAL EXAM: Vitals:   02/23/17 1138 02/23/17 1143 02/23/17 1145  BP: 139/85 (!) 142/88 (!) 141/88  Pulse: 72 74 74  Resp: 18    Temp: (!) 97.4 F (36.3 C)    TempSrc: Oral    SpO2: 96%    Weight: 257 lb (116.6 kg)    Height: 6\' 1"  (1.854 m)      GENERAL: The patient is a well-nourished male, in no acute distress. The vital signs are documented above. CARDIOVASCULAR: Left neck incision is well-healed.  No carotid bruit PULMONARY: There is good air exchange  MUSCULOSKELETAL: There are no major deformities or cyanosis. NEUROLOGIC: No focal weakness or paresthesias are detected. SKIN: There are no ulcers or rashes noted. PSYCHIATRIC: The  patient has a normal affect.  DATA:  Duplex today was reviewed with the patient.  This shows widely patent endarterectomy on the left and no stenosis in his right carotid artery.  MEDICAL ISSUES: Stable status post left carotid endarterectomy for symptomatic disease July 2018.  He will discontinue his Plavix.  We will continue aspirin 81 mg/day.  Will see [place] again in 6 months with repeat carotid duplex.  Assuming this is unremarkable we will then drop back to yearly carotid duplex surveillance    Rosetta Posner, MD [facility] Vascular and Vein Specialists of [facility] Tel [phone] Pager [phone]

## 2017-02-24 ENCOUNTER — Other Ambulatory Visit: Payer: Self-pay

## 2017-02-24 DIAGNOSIS — I6529 Occlusion and stenosis of unspecified carotid artery: Secondary | ICD-10-CM

## 2017-02-24 LAB — VAS US CAROTID
LCCADSYS: 100 cm/s
LCCAPDIAS: 21 cm/s
LEFT ECA DIAS: -22 cm/s
LEFT VERTEBRAL DIAS: -14 cm/s
LICADSYS: -124 cm/s
LICAPDIAS: -18 cm/s
Left CCA dist dias: 21 cm/s
Left CCA prox sys: 115 cm/s
Left ICA dist dias: -39 cm/s
Left ICA prox sys: -73 cm/s
RCCADSYS: -96 cm/s
RCCAPDIAS: 25 cm/s
RCCAPSYS: 92 cm/s
RIGHT CCA MID DIAS: 30 cm/s
RIGHT ECA DIAS: 29 cm/s
RIGHT VERTEBRAL DIAS: -14 cm/s

## 2017-05-12 ENCOUNTER — Encounter: Payer: Self-pay | Admitting: Internal Medicine

## 2017-07-09 ENCOUNTER — Other Ambulatory Visit: Payer: Self-pay | Admitting: Internal Medicine

## 2017-07-09 ENCOUNTER — Other Ambulatory Visit: Payer: Self-pay

## 2017-07-09 ENCOUNTER — Ambulatory Visit (AMBULATORY_SURGERY_CENTER): Payer: Self-pay

## 2017-07-09 VITALS — Ht 73.0 in | Wt 260.0 lb

## 2017-07-09 DIAGNOSIS — Z1211 Encounter for screening for malignant neoplasm of colon: Secondary | ICD-10-CM

## 2017-07-09 MED ORDER — PEG-KCL-NACL-NASULF-NA ASC-C 140 G PO SOLR: 1.0000 | Freq: Once | ORAL | 0 refills | Status: AC

## 2017-07-09 NOTE — Progress Notes (Signed)
Denies allergies to eggs or soy products. Denies complication of anesthesia or sedation. Denies use of weight loss medication. Denies use of O2.   Emmi instructions declined.  A sample of Plenvu was given to patient.

## 2017-07-23 ENCOUNTER — Ambulatory Visit (AMBULATORY_SURGERY_CENTER): Payer: Medicare Other | Admitting: Internal Medicine

## 2017-07-23 ENCOUNTER — Encounter: Payer: Self-pay | Admitting: Internal Medicine

## 2017-07-23 VITALS — BP 119/67 | HR 68 | Temp 98.6°F | Resp 9 | Ht 73.0 in | Wt 260.0 lb

## 2017-07-23 DIAGNOSIS — Z1211 Encounter for screening for malignant neoplasm of colon: Secondary | ICD-10-CM | POA: Diagnosis present

## 2017-07-23 DIAGNOSIS — D123 Benign neoplasm of transverse colon: Secondary | ICD-10-CM | POA: Diagnosis not present

## 2017-07-23 DIAGNOSIS — D122 Benign neoplasm of ascending colon: Secondary | ICD-10-CM | POA: Diagnosis not present

## 2017-07-23 MED ORDER — SODIUM CHLORIDE 0.9 % IV SOLN: 500.0000 mL | Freq: Once | INTRAVENOUS | Status: DC

## 2017-07-23 NOTE — Progress Notes (Signed)
Pt's states no medical or surgical changes since previsit or office visit. 

## 2017-07-23 NOTE — Progress Notes (Signed)
Called to room to assist during endoscopic procedure.  Patient ID and intended procedure confirmed with present staff. Received instructions for my participation in the procedure from the performing physician.  

## 2017-07-23 NOTE — Patient Instructions (Signed)
**   Handouts given on polyps and diverticulosis **   YOU HAD AN ENDOSCOPIC PROCEDURE TODAY AT THE [facility]:   Refer to the procedure report that was given to you for any specific questions about what was found during the examination.  If the procedure report does not answer your questions, please call your gastroenterologist to clarify.  If you requested that your care partner not be given the details of your procedure findings, then the procedure report has been included in a sealed envelope for you to review at your convenience later.  YOU SHOULD EXPECT: Some feelings of bloating in the abdomen. Passage of more gas than usual.  Walking can help get rid of the air that was put into your GI tract during the procedure and reduce the bloating. If you had a lower endoscopy (such as a colonoscopy or flexible sigmoidoscopy) you may notice spotting of blood in your stool or on the toilet paper. If you underwent a bowel prep for your procedure, you may not have a normal bowel movement for a few days.  Please Note:  You might notice some irritation and congestion in your nose or some drainage.  This is from the oxygen used during your procedure.  There is no need for concern and it should clear up in a day or so.  SYMPTOMS TO REPORT IMMEDIATELY:   Following lower endoscopy (colonoscopy or flexible sigmoidoscopy):  Excessive amounts of blood in the stool  Significant tenderness or worsening of abdominal pains  Swelling of the abdomen that is new, acute  Fever of 100F or higher  For urgent or emergent issues, a gastroenterologist can be reached at any hour by calling [phone].   DIET:  We do recommend a small meal at first, but then you may proceed to your regular diet.  Drink plenty of fluids but you should avoid alcoholic beverages for 24 hours.  ACTIVITY:  You should plan to take it easy for the rest of today and you should NOT DRIVE or use heavy machinery until tomorrow (because  of the sedation medicines used during the test).    FOLLOW UP: Our staff will call the number listed on your records the next business day following your procedure to check on you and address any questions or concerns that you may have regarding the information given to you following your procedure. If we do not reach you, we will leave a message.  However, if you are feeling well and you are not experiencing any problems, there is no need to return our call.  We will assume that you have returned to your regular daily activities without incident.  If any biopsies were taken you will be contacted by phone or by letter within the next 1-3 weeks.  Please call us at [phone] if you have not heard about the biopsies in 3 weeks.    SIGNATURES/CONFIDENTIALITY: You and/or your care partner have signed paperwork which will be entered into your electronic medical record.  These signatures attest to the fact that that the information above on your After Visit Summary has been reviewed and is understood.  Full responsibility of the confidentiality of this discharge information lies with you and/or your care-partner. 

## 2017-07-23 NOTE — Progress Notes (Signed)
Report given to PACU, vss 

## 2017-07-23 NOTE — Op Note (Signed)
[facility] Patient Name: Samuel Serrano Procedure Date: 07/23/2017 8:31 AM MRN: [id number] Endoscopist: Jerene Bears , MD Age: 67 Referring MD:  Date of Birth: 05-16-1950 Gender: Male Account #: [id number] Procedure:                Colonoscopy Indications:              Screening for colorectal malignant neoplasm, Last                            colonoscopy 10 years ago Medicines:                Monitored Anesthesia Care Procedure:                Pre-Anesthesia Assessment:                           - Prior to the procedure, a History and Physical                            was performed, and patient medications and                            allergies were reviewed. The patient's tolerance of                            previous anesthesia was also reviewed. The risks                            and benefits of the procedure and the sedation                            options and risks were discussed with the patient.                            All questions were answered, and informed consent                            was obtained. Prior Anticoagulants: The patient has                            taken no previous anticoagulant or antiplatelet                            agents. ASA Grade Assessment: II - A patient with                            mild systemic disease. After reviewing the risks                            and benefits, the patient was deemed in                            satisfactory condition to undergo the procedure.  After obtaining informed consent, the colonoscope                            was passed under direct vision. Throughout the                            procedure, the patient's blood pressure, pulse, and                            oxygen saturations were monitored continuously. The                            Colonoscope was introduced through the anus and                            advanced to the colocolonic anastomosis. The                             colonoscopy was performed without difficulty. The                            patient tolerated the procedure well. The quality                            of the bowel preparation was good. The ileocecal                            valve, appendiceal orifice, and rectum were                            photographed. Scope In: 8:41:06 AM Scope Out: 9:02:12 AM Scope Withdrawal Time: 0 hours 15 minutes 28 seconds  Total Procedure Duration: 0 hours 21 minutes 6 seconds  Findings:                 The digital rectal exam was normal.                           A 15 mm polyp was found in the ascending colon. The                            polyp was sessile. The polyp was removed with a                            piecemeal technique using a cold snare. Resection                            and retrieval were complete.                           Three sessile polyps were found in the transverse                            colon (1) and ascending colon (2). The polyps were  2 to 4 mm in size. These polyps were removed with a                            cold snare. Resection and retrieval were complete.                           Multiple small and large-mouthed diverticula were                            found in the sigmoid colon, descending colon and                            ascending colon.                           The retroflexed view of the distal rectum and anal                            verge was normal and showed no anal or rectal                            abnormalities. Complications:            No immediate complications. Estimated Blood Loss:     Estimated blood loss was minimal. Impression:               - One 15 mm polyp in the ascending colon, removed                            piecemeal using a cold snare. Resected and                            retrieved.                           - Three 2 to 4 mm polyps in the transverse colon                             and in the ascending colon, removed with a cold                            snare. Resected and retrieved.                           - Diverticulosis in the sigmoid colon, in the                            descending colon and in the ascending colon.                           - The distal rectum and anal verge are normal on                            retroflexion view. Recommendation:           -  Patient has a contact number available for                            emergencies. The signs and symptoms of potential                            delayed complications were discussed with the                            patient. Return to normal activities tomorrow.                            Written discharge instructions were provided to the                            patient.                           - Resume previous diet.                           - Continue present medications.                           - Await pathology results.                           - Repeat colonoscopy is recommended for                            surveillance. The colonoscopy date will be                            determined after pathology results from today's                            exam become available for review. Jerene Bears, MD 07/23/2017 9:07:59 AM This report has been signed electronically.

## 2017-07-26 ENCOUNTER — Telehealth: Payer: Self-pay

## 2017-07-26 NOTE — Telephone Encounter (Signed)
  Follow up Call-  Call back number 07/23/2017  Post procedure Call Back phone  # [phone]  Permission to leave phone message Yes  Some recent data might be hidden     Patient questions:  Do you have a fever, pain , or abdominal swelling? No. Pain Score  0 *  Have you tolerated food without any problems? Yes.    Have you been able to return to your normal activities? Yes.    Do you have any questions about your discharge instructions: Diet   No. Medications  No. Follow up visit  No.  Do you have questions or concerns about your Care? No.  Actions: * If pain score is 4 or above: No action needed, pain <4.  No problems noted per pt. maw

## 2017-07-30 ENCOUNTER — Encounter: Payer: Self-pay | Admitting: Internal Medicine

## 2017-08-24 ENCOUNTER — Encounter (HOSPITAL_COMMUNITY): Payer: Medicare Other

## 2017-08-24 ENCOUNTER — Ambulatory Visit: Payer: Medicare Other | Admitting: Family

## 2019-01-07 ENCOUNTER — Emergency Department (HOSPITAL_COMMUNITY): Payer: Medicare Other

## 2019-01-07 ENCOUNTER — Encounter (HOSPITAL_COMMUNITY): Payer: Self-pay | Admitting: Emergency Medicine

## 2019-01-07 ENCOUNTER — Emergency Department (HOSPITAL_COMMUNITY)
Admission: EM | Admit: 2019-01-07 | Discharge: 2019-01-07 | Disposition: A | Discharge status: Home / Self Care | Payer: Medicare Other | Attending: Emergency Medicine | Admitting: Emergency Medicine

## 2019-01-07 ENCOUNTER — Other Ambulatory Visit: Payer: Self-pay

## 2019-01-07 DIAGNOSIS — U071 COVID-19: Secondary | ICD-10-CM | POA: Diagnosis not present

## 2019-01-07 DIAGNOSIS — R0789 Other chest pain: Secondary | ICD-10-CM | POA: Diagnosis not present

## 2019-01-07 DIAGNOSIS — F17210 Nicotine dependence, cigarettes, uncomplicated: Secondary | ICD-10-CM | POA: Diagnosis not present

## 2019-01-07 DIAGNOSIS — R509 Fever, unspecified: Secondary | ICD-10-CM | POA: Diagnosis present

## 2019-01-07 LAB — BASIC METABOLIC PANEL
Anion gap: 9 (ref 5–15)
BUN: 15 mg/dL (ref 8–23)
CO2: 23 mmol/L (ref 22–32)
Calcium: 8.8 mg/dL — ABNORMAL LOW (ref 8.9–10.3)
Chloride: 104 mmol/L (ref 98–111)
Creatinine, Ser: 1.03 mg/dL (ref 0.61–1.24)
GFR calc Af Amer: >60 mL/min (ref >60)
GFR calc non Af Amer: >60 mL/min (ref >60)
Glucose, Bld: 108 mg/dL — ABNORMAL HIGH (ref 70–99)
Potassium: 3.9 mmol/L (ref 3.5–5.1)
Sodium: 136 mmol/L (ref 135–145)

## 2019-01-07 LAB — TROPONIN I (HIGH SENSITIVITY)
Troponin I (High Sensitivity): 8 ng/L (ref <18)
Troponin I (High Sensitivity): 8 ng/L (ref <18)

## 2019-01-07 LAB — CBC
HCT: 42.9 % (ref 39.0–52.0)
Hemoglobin: 14.2 g/dL (ref 13.0–17.0)
MCH: 30.8 pg (ref 26.0–34.0)
MCHC: 33.1 g/dL (ref 30.0–36.0)
MCV: 93.1 fL (ref 80.0–100.0)
Platelets: 147 10*3/uL — ABNORMAL LOW (ref 150–400)
RBC: 4.61 MIL/uL (ref 4.22–5.81)
RDW: 12.8 % (ref 11.5–15.5)
WBC: 4.6 10*3/uL (ref 4.0–10.5)
nRBC: 0 % (ref 0.0–0.2)

## 2019-01-07 LAB — POC SARS CORONAVIRUS 2 AG -  ED: SARS Coronavirus 2 Ag: POSITIVE — AB

## 2019-01-07 IMAGING — CR DG CHEST 1V PORT
2 series · 2 of 2 positions shown · non-contrast
Comparison: [DATE]

CLINICAL DATA: Congestion after COVID exposure.

EXAM:
PORTABLE CHEST 1 VIEW

[AP (1 of 2)]
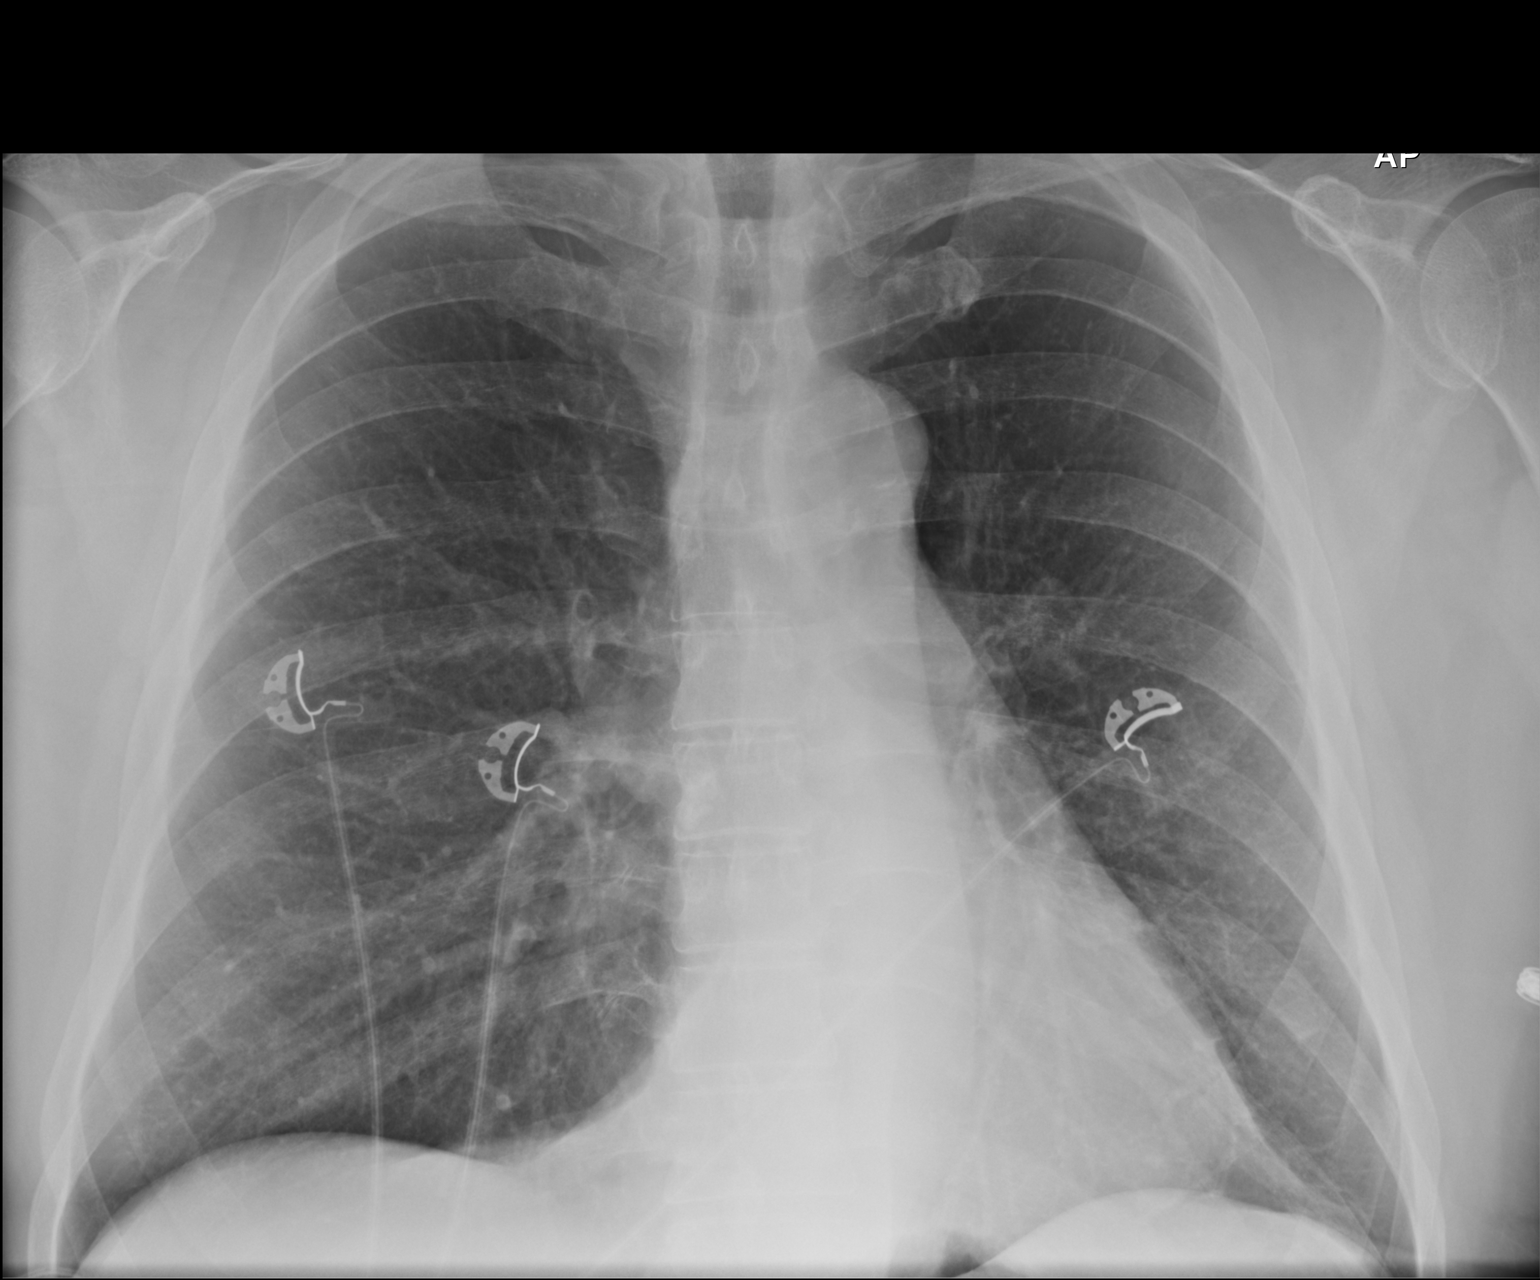

[AP (2 of 2)]
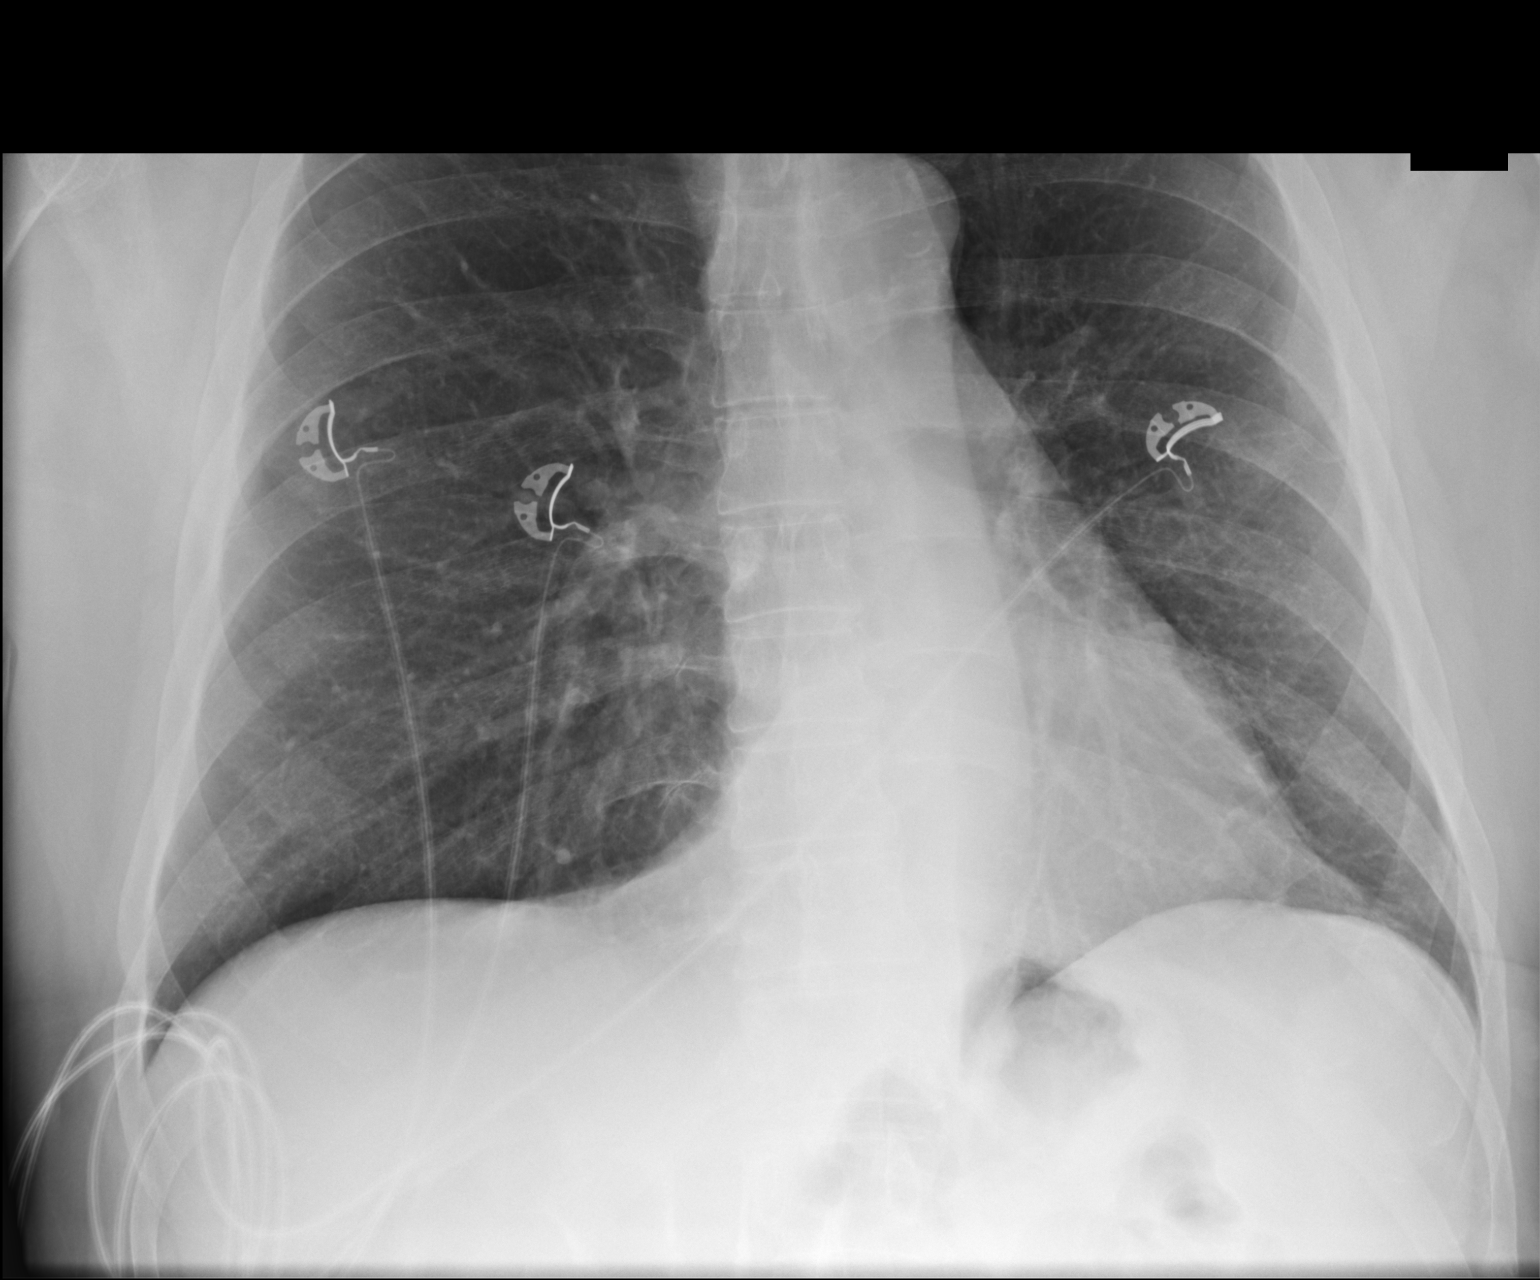

[2 of 2 positions shown; findings below may reference images not displayed]

FINDINGS: [DATE]. The lungs are clear without focal pneumonia, edema,
pneumothorax or pleural effusion. Interstitial markings are
diffusely coarsened with chronic features. The cardiopericardial
silhouette is within normal limits for size. The visualized bony
structures of the thorax are intact.
IMPRESSION: No active disease.

## 2019-01-07 MED ORDER — NAPROXEN 375 MG PO TABS: 375.0000 mg | ORAL_TABLET | Freq: Two times a day (BID) | ORAL | 0 refills | Status: AC

## 2019-01-07 MED ORDER — ONDANSETRON HCL 4 MG PO TABS: 4.0000 mg | ORAL_TABLET | Freq: Four times a day (QID) | ORAL | 0 refills | Status: AC

## 2019-01-07 MED ORDER — SODIUM CHLORIDE 0.9% FLUSH
3.0000 mL | Freq: Once | INTRAVENOUS | Status: AC
  Administered 2019-01-07: 16:00:00 3 mL via INTRAVENOUS

## 2019-01-07 MED ORDER — ONDANSETRON HCL 4 MG/2ML IJ SOLN
4.0000 mg | Freq: Once | INTRAMUSCULAR | Status: AC
  Administered 2019-01-07: 4 mg via INTRAVENOUS
  Filled 2019-01-07: qty 2

## 2019-01-07 MED ORDER — METHOCARBAMOL 500 MG PO TABS: 500.0000 mg | ORAL_TABLET | Freq: Every day | ORAL | 0 refills | Status: DC

## 2019-01-07 MED ORDER — SODIUM CHLORIDE 0.9 % IV BOLUS
1000.0000 mL | Freq: Once | INTRAVENOUS | Status: AC
  Administered 2019-01-07: 1000 mL via INTRAVENOUS

## 2019-01-07 NOTE — ED Notes (Signed)
Ambulated Pt in the RM with no O2 while check Pulse Ox. Pt started at 96% and stayed between 96%-100% while walking. Pt did 6 laps in the RM.

## 2019-01-07 NOTE — ED Provider Notes (Signed)
[facility] EMERGENCY DEPARTMENT Provider Note   CSN: [id number] Arrival date & time: 01/07/19  1035     History Chief Complaint  Patient presents with  . ? COVID  . Chest Pain    Samuel Serrano is a 68 y.o. male with PMH/o peripheral vascular disease, carotid stenosis who presents for evaluation of 1 week of generalized body aches, fevers, chills.  He states that for the last week, he has had fevers up to 100.4.  He has been taking ibuprofen.  He states that he has had no energy and has felt weak and fatigued.  He states he had a few episodes of vomiting which sound like posttussive emesis.  He states that he will start coughing and then it triggers a gag reflex which causes him to have episodes of vomiting.  No blood in emesis noted.  He states he has had decreased appetite.  He states over last couple days, he started developing some pain in the bilateral sides of his lower chest.  He states it feels like a tightness and a soreness.  It is worse when he takes a deep inspiration.  He does not have any difficulty breathing.  He does report cough that is productive of phlegm.  No hemoptysis.  He states that he was recently around 2 friends about a week ago who was both ended up testing positive for COVID. He does smoke cigarettes. No history of COPD or asthma. He denies any recent travel.   The history is provided by the patient.       Past Medical History:  Diagnosis Date  . Carotid artery occlusion   . Peripheral vascular disease ([place])    left carotid stenosis    Patient Active Problem List   Diagnosis Date Noted  . Carotid stenosis 07/24/2016    Past Surgical History:  Procedure Laterality Date  . COLONOSCOPY    . CYST EXCISION     benign cysts in breasts at age 73  . ENDARTERECTOMY Left 07/24/2016   Procedure: Left Carotid ENDARTERECTOMY;  Surgeon: Rosetta Posner, MD;  Location: [facility];  Service: Vascular;  Laterality: Left;  . PATCH ANGIOPLASTY Left 07/24/2016    Procedure: PATCH ANGIOPLASTY;  Surgeon: Rosetta Posner, MD;  Location: [facility];  Service: Vascular;  Laterality: Left;  . WISDOM TOOTH EXTRACTION         Family History  Problem Relation Age of Onset  . Breast cancer Mother   . Diabetes Father   . Hypertension Father   . Colon cancer Neg Hx   . Esophageal cancer Neg Hx   . Liver cancer Neg Hx   . Pancreatic cancer Neg Hx   . Rectal cancer Neg Hx   . Stomach cancer Neg Hx     Social History   Tobacco Use  . Smoking status: Current Every Day Smoker    Packs/day: 0.00    Types: Cigarettes  . Smokeless tobacco: Never Used  . Tobacco comment: 1 1/4 pk per day  Substance Use Topics  . Alcohol use: Yes    Comment: occasional  . Drug use: No    Home Medications Prior to Admission medications   Medication Sig Start Date End Date Taking? Authorizing Provider  naproxen (NAPROSYN) 375 MG tablet Take 1 tablet (375 mg total) by mouth 2 (two) times daily for 7 days. 01/07/19 01/14/19  Volanda Napoleon, PA-C  Pseudoeph-Doxylamine-DM-APAP (NYQUIL PO) Take 45 mLs by mouth at bedtime.    [provider]    Allergies    No known allergies  Review of Systems   Review of Systems  Constitutional: Positive for activity change, appetite change, fatigue and fever. Negative for diaphoresis.  Respiratory: Positive for cough. Negative for shortness of breath.   Cardiovascular: Positive for chest pain.  Gastrointestinal: Negative for abdominal pain, nausea and vomiting.  Genitourinary: Negative for dysuria and hematuria.  Neurological: Negative for headaches.  All other systems reviewed and are negative.   Physical Exam Updated Vital Signs BP 129/70   Pulse 77   Temp 98.8 F (37.1 C) (Oral)   Resp (!) 24   SpO2 92%   Physical Exam Vitals and nursing note reviewed.  Constitutional:      Appearance: Normal appearance. He is well-developed.  HENT:     Head: Normocephalic and atraumatic.  Eyes:     General: Lids are  normal.     Conjunctiva/sclera: Conjunctivae normal.     Pupils: Pupils are equal, round, and reactive to light.  Cardiovascular:     Rate and Rhythm: Normal rate and regular rhythm.     Pulses: Normal pulses.     Heart sounds: Normal heart sounds. No murmur. No friction rub. No gallop.   Pulmonary:     Effort: Pulmonary effort is normal.     Breath sounds: Normal breath sounds.     Comments: Lungs clear to auscultation bilaterally.  Symmetric chest rise.  No wheezing, rales, rhonchi. Intermittently coughing.  Chest:       Comments: Tenderness to palpation noted to bilateral sides of the chest wall. No deformity or crepitus  Abdominal:     Palpations: Abdomen is soft. Abdomen is not rigid.     Tenderness: There is no abdominal tenderness. There is no guarding.     Comments: Abdomen is soft, non-distended, non-tender. No rigidity, No guarding. No peritoneal signs.  Musculoskeletal:        General: Normal range of motion.     Cervical back: Full passive range of motion without pain.  Skin:    General: Skin is warm and dry.     Capillary Refill: Capillary refill takes less than 2 seconds.  Neurological:     Mental Status: He is alert and oriented to person, place, and time.  Psychiatric:        Speech: Speech normal.     ED Results / Procedures / Treatments   Labs (all labs ordered are listed, but only abnormal results are displayed) Labs Reviewed  BASIC METABOLIC PANEL - Abnormal; Notable for the following components:      Result Value   Glucose, Bld 108 (*)    Calcium 8.8 (*)    All other components within normal limits  CBC - Abnormal; Notable for the following components:   Platelets 147 (*)    All other components within normal limits  POC SARS CORONAVIRUS 2 AG -  ED - Abnormal; Notable for the following components:   SARS Coronavirus 2 Ag POSITIVE (*)    All other components within normal limits  TROPONIN I (HIGH SENSITIVITY)  TROPONIN I (HIGH SENSITIVITY)     EKG EKG Interpretation  Date/Time:  Saturday January 07 2019 11:11:32 EST Ventricular Rate:  89 PR Interval:  146 QRS Duration: 92 QT Interval:  386 QTC Calculation: 469 R Axis:   121 Text Interpretation: Normal sinus rhythm Incomplete right bundle branch block Left posterior fascicular block Abnormal ECG TW change lead III new in comparison to prior, no other  significant changes Confirmed by Gareth Morgan [phone]) on 01/07/2019 2:25:03 PM   Radiology DG Chest Portable 1 View  Result Date: 01/07/2019 CLINICAL DATA:  Congestion after COVID exposure. EXAM: PORTABLE CHEST 1 VIEW COMPARISON:  08/28/2015 FINDINGS: 01/07/2019. The lungs are clear without focal pneumonia, edema, pneumothorax or pleural effusion. Interstitial markings are diffusely coarsened with chronic features. The cardiopericardial silhouette is within normal limits for size. The visualized bony structures of the thorax are intact. IMPRESSION: No active disease. Electronically Signed   By: Misty Stanley M.D.   On: 01/07/2019 13:39    Procedures Procedures (including critical care time)  Medications Ordered in ED Medications  sodium chloride flush (NS) 0.9 % injection 3 mL (3 mLs Intravenous Given 01/07/19 1533)  sodium chloride 0.9 % bolus 1,000 mL (1,000 mLs Intravenous New Bag/Given 01/07/19 1539)  ondansetron (ZOFRAN) injection 4 mg (4 mg Intravenous Given 01/07/19 1534)    ED Course  I have reviewed the triage vital signs and the nursing notes.  Pertinent labs & imaging results that were available during my care of the patient were reviewed by me and considered in my medical decision making (see chart for details).    MDM Rules/Calculators/A&P                      68 y.o. M with PMH/o CAD, PVD who presents for 1 week of generalized body aches, fevers, fatigue, decreased appetite, cough, chest pain that began about 2 days ago.  Recent Covid exposure about 8 days ago.  Initially arrival, he is afebrile,  nontoxic-appearing.  He is intermittently coughing but vitals are stable.  No evidence of hypoxia.  Doubt this is ACS etiology but given his history, will plan for EKG and troponins.  Discern for infectious etiology such as COVID-19/pneumonia.  Plan to check labs, EKG, chest x-ray.  BMP shows normal BUN/Cr. CBC shows no leukocytosis. Hgb 14.2.  His initial troponin is negative.  His Covid is positive.  Chest x-ray negative for any acute infectious etiology.  I discussed results with patient.  At this time, patient is hemodynamically stable without any signs of hypoxia.  He has no difficulty breathing.  At this time, I do not suspect that his chest pain is related to ACS etiology.  I suspect this is more related from costochondritis from coughing/muscular irritation.  At this time, I do not feel he needs criteria for admission.  We will plan to ambulate him and check his pulse ox.  Patient able to ambulate in the room with maintaining pulse ox between 96 and 100% on room air.  At this time, his exam/history is not concerning for ACS etiology, PE.  I discussed with him regarding Covid home isolation guidance.  Encouraged at home supportive care measures. At this time, patient exhibits no emergent life-threatening condition that require further evaluation in ED or admission. Discussed patient with Dr. Billy Fischer who is agreeable.  Patient had ample opportunity for questions and discussion. All patient's questions were answered with full understanding. Strict return precautions discussed. Patient expresses understanding and agreement to plan.   Portions of this note were generated with Lobbyist. Dictation errors may occur despite best attempts at proofreading.  Final Clinical Impression(s) / ED Diagnoses Final diagnoses:  [id number]    Rx / DC Orders ED Discharge Orders         Ordered    methocarbamol (ROBAXIN) 500 MG tablet  Daily,   Status:  Discontinued  01/07/19 1542     naproxen (NAPROSYN) 375 MG tablet  2 times daily     01/07/19 1544           Desma Mcgregor 01/07/19 1546    Gareth Morgan, MD 01/07/19 2159

## 2019-01-07 NOTE — Discharge Instructions (Signed)
As we discussed, your Covid test was positive.  This is likely the cause of your symptoms.  As we discussed, you will need to expect to have symptoms for several days to week.  It is important that you stay hydrated and make sure you are drinking plenty of fluids.  Take Tylenol or ibuprofen as needed for fevers and aches.  You can take naproxen for pain.  Do not take it if you are taking ibuprofen.   You will need to quarantine for 10 to 14 days.   Closely monitor your symptoms.  Return the emergency department for any difficulty breathing, fevers, inability to eat or drink, persistent vomiting or any other worsening or concerning symptoms.     Person Under Monitoring Name: Samuel Serrano  Location: [place] [address]   Infection Prevention Recommendations for Individuals Confirmed to have, or Being Evaluated for, 2019 Novel Coronavirus (COVID-19) Infection Who Receive Care at Home  Individuals who are confirmed to have, or are being evaluated for, COVID-19 should follow the prevention steps below until a healthcare provider or local or state health department says they can return to normal activities.  Stay home except to get medical care You should restrict activities outside your home, except for getting medical care. Do not go to work, school, or public areas, and do not use public transportation or taxis.  Call ahead before visiting your doctor Before your medical appointment, call the healthcare provider and tell them that you have, or are being evaluated for, COVID-19 infection. This will help the healthcare provider's office take steps to keep other people from getting infected. Ask your healthcare provider to call the local or state health department.  Monitor your symptoms Seek prompt medical attention if your illness is worsening (e.g., difficulty breathing). Before going to your medical appointment, call the healthcare provider and tell them that  you have, or are being evaluated for, COVID-19 infection. Ask your healthcare provider to call the local or state health department.  Wear a facemask You should wear a facemask that covers your nose and mouth when you are in the same room with other people and when you visit a healthcare provider. People who live with or visit you should also wear a facemask while they are in the same room with you.  Separate yourself from other people in your home As much as possible, you should stay in a different room from other people in your home. Also, you should use a separate bathroom, if available.  Avoid sharing household items You should not share dishes, drinking glasses, cups, eating utensils, towels, bedding, or other items with other people in your home. After using these items, you should wash them thoroughly with soap and water.  Cover your coughs and sneezes Cover your mouth and nose with a tissue when you cough or sneeze, or you can cough or sneeze into your sleeve. Throw used tissues in a lined trash can, and immediately wash your hands with soap and water for at least 20 seconds or use an alcohol-based hand rub.  Wash your [facility] your hands often and thoroughly with soap and water for at least 20 seconds. You can use an alcohol-based hand sanitizer if soap and water are not available and if your hands are not visibly dirty. Avoid touching your eyes, nose, and mouth with unwashed hands.   Prevention Steps for Caregivers and Household Members of Individuals Confirmed to have, or Being Evaluated for, COVID-19 Infection Being  Cared for in the Home  If you live with, or provide care at home for, a person confirmed to have, or being evaluated for, COVID-19 infection please follow these guidelines to prevent infection:  Follow healthcare provider's instructions Make sure that you understand and can help the patient follow any healthcare provider instructions for all  care.  Provide for the patient's basic needs You should help the patient with basic needs in the home and provide support for getting groceries, prescriptions, and other personal needs.  Monitor the patient's symptoms If they are getting sicker, call his or her medical provider and tell them that the patient has, or is being evaluated for, COVID-19 infection. This will help the healthcare provider's office take steps to keep other people from getting infected. Ask the healthcare provider to call the local or state health department.  Limit the number of people who have contact with the patient If possible, have only one caregiver for the patient. Other household members should stay in another home or place of residence. If this is not possible, they should stay in another room, or be separated from the patient as much as possible. Use a separate bathroom, if available. Restrict visitors who do not have an essential need to be in the home.  Keep older adults, very young children, and other sick people away from the patient Keep older adults, very young children, and those who have compromised immune systems or chronic health conditions away from the patient. This includes people with chronic heart, lung, or kidney conditions, diabetes, and cancer.  Ensure good ventilation Make sure that shared spaces in the home have good air flow, such as from an air conditioner or an opened window, weather permitting.  Wash your hands often Wash your hands often and thoroughly with soap and water for at least 20 seconds. You can use an alcohol based hand sanitizer if soap and water are not available and if your hands are not visibly dirty. Avoid touching your eyes, nose, and mouth with unwashed hands. Use disposable paper towels to dry your hands. If not available, use dedicated cloth towels and replace them when they become wet.  Wear a facemask and gloves Wear a disposable facemask at all times in  the room and gloves when you touch or have contact with the patient's blood, body fluids, and/or secretions or excretions, such as sweat, saliva, sputum, nasal mucus, vomit, urine, or feces.  Ensure the mask fits over your nose and mouth tightly, and do not touch it during use. Throw out disposable facemasks and gloves after using them. Do not reuse. Wash your hands immediately after removing your facemask and gloves. If your personal clothing becomes contaminated, carefully remove clothing and launder. Wash your hands after handling contaminated clothing. Place all used disposable facemasks, gloves, and other waste in a lined container before disposing them with other household waste. Remove gloves and wash your hands immediately after handling these items.  Do not share dishes, glasses, or other household items with the patient Avoid sharing household items. You should not share dishes, drinking glasses, cups, eating utensils, towels, bedding, or other items with a patient who is confirmed to have, or being evaluated for, COVID-19 infection. After the person uses these items, you should wash them thoroughly with soap and water.  Wash laundry thoroughly Immediately remove and wash clothes or bedding that have blood, body fluids, and/or secretions or excretions, such as sweat, saliva, sputum, nasal mucus, vomit, urine, or feces, on them.  Wear gloves when handling laundry from the patient. Read and follow directions on labels of laundry or clothing items and detergent. In general, wash and dry with the warmest temperatures recommended on the label.  Clean all areas the individual has used often Clean all touchable surfaces, such as counters, tabletops, doorknobs, bathroom fixtures, toilets, phones, keyboards, tablets, and bedside tables, every day. Also, clean any surfaces that may have blood, body fluids, and/or secretions or excretions on them. Wear gloves when cleaning surfaces the patient has  come in contact with. Use a diluted bleach solution (e.g., dilute bleach with 1 part bleach and 10 parts water) or a household disinfectant with a label that says EPA-registered for coronaviruses. To make a bleach solution at home, add 1 tablespoon of bleach to 1 quart (4 cups) of water. For a larger supply, add  cup of bleach to 1 gallon (16 cups) of water. Read labels of cleaning products and follow recommendations provided on product labels. Labels contain instructions for safe and effective use of the cleaning product including precautions you should take when applying the product, such as wearing gloves or eye protection and making sure you have good ventilation during use of the product. Remove gloves and wash hands immediately after cleaning.  Monitor yourself for signs and symptoms of illness Caregivers and household members are considered close contacts, should monitor their health, and will be asked to limit movement outside of the home to the extent possible. Follow the monitoring steps for close contacts listed on the symptom monitoring form.   ? If you have additional questions, contact your local health department or call the epidemiologist on call at [phone] (available 24/7). ? This guidance is subject to change. For the most up-to-date guidance from [facility], please refer to their website: [email or web address]

## 2019-01-07 NOTE — ED Triage Notes (Signed)
Pt reports exposure to 2 friends that are COVID +.  Reports congestion, fever, chills, and body aches x 1 week.  This morning he started having pain across chest that is worse with deep inspiration.

## 2020-04-04 ENCOUNTER — Other Ambulatory Visit: Payer: Self-pay | Admitting: Otolaryngology

## 2020-04-04 DIAGNOSIS — J359 Chronic disease of tonsils and adenoids, unspecified: Secondary | ICD-10-CM

## 2020-04-22 ENCOUNTER — Ambulatory Visit
Admission: RE | Admit: 2020-04-22 | Discharge: 2020-04-22 | Disposition: A | Discharge status: Home / Self Care | Payer: Medicare Other | Source: Ambulatory Visit | Attending: Otolaryngology | Admitting: Otolaryngology

## 2020-04-22 ENCOUNTER — Other Ambulatory Visit: Payer: Self-pay

## 2020-04-22 DIAGNOSIS — J359 Chronic disease of tonsils and adenoids, unspecified: Secondary | ICD-10-CM

## 2020-04-22 IMAGING — CT CT NECK W/ CM
3 of 4 series · 6 of 14 positions shown, 7 images · IV contrast (iopamidol)
Comparison: CTA of the neck [DATE]

CLINICAL DATA: Tonsil lesion seen by dentist 4 weeks ago

EXAM:
CT NECK WITH CONTRAST
TECHNIQUE: Multidetector CT imaging of the neck was performed using the
standard protocol following the bolus administration of intravenous
contrast.
CONTRAST:  75mL [7E] IOPAMIDOL ([7E]) INJECTION 61%

[Series 3: neck · axial · 0.56mm/px · z∈[-258,-154]mm · 2 of 156 slices shown, 3 images]
[im 52/156  soft-tissue]
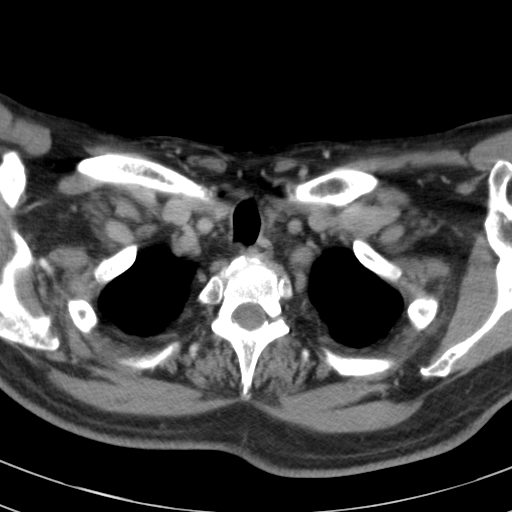
[im 52/156  bone]
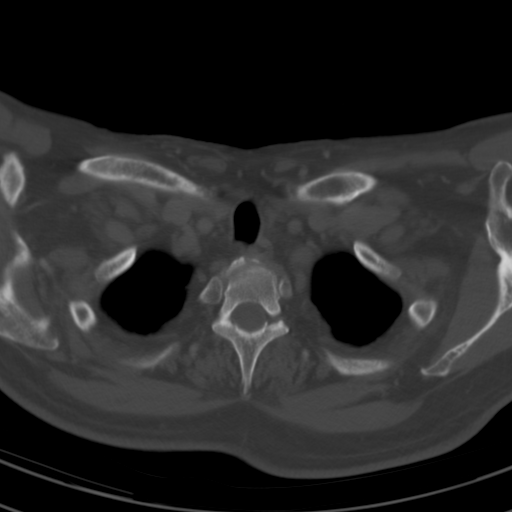
[im 104/156  bone]
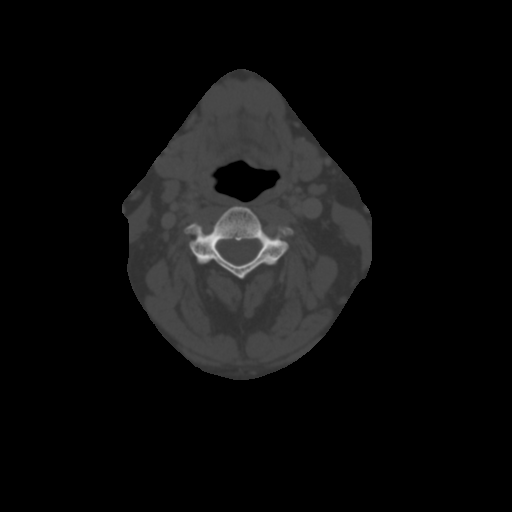

[Series 8: angled axial-oropharynx · axial · 0.53mm/px · z∈[-246,-144]mm · 2 of 154 slices shown]
[im 52/154  bone]
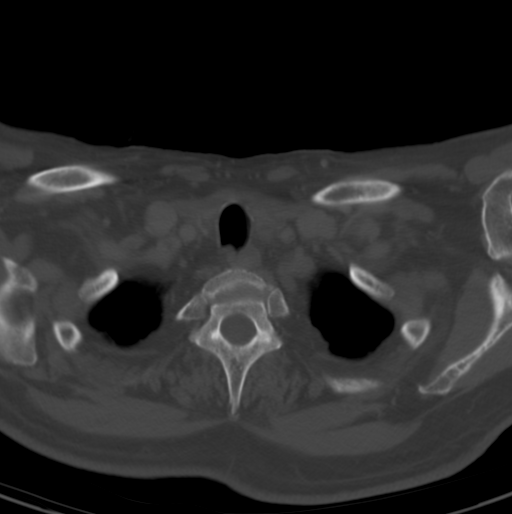
[im 103/154  bone]
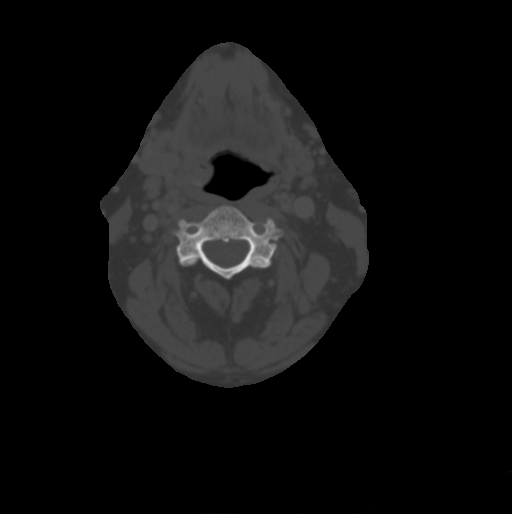

[Series 9: angled (person_name) · axial · 0.53mm/px · z∈[-246,-144]mm · 2 of 154 slices shown]
[im 52/154  bone]
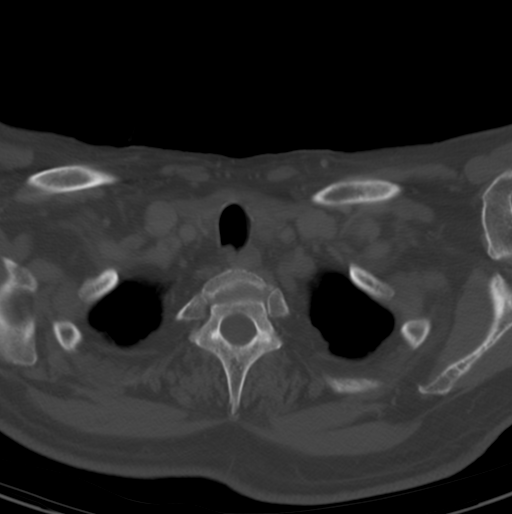
[im 103/154  bone]
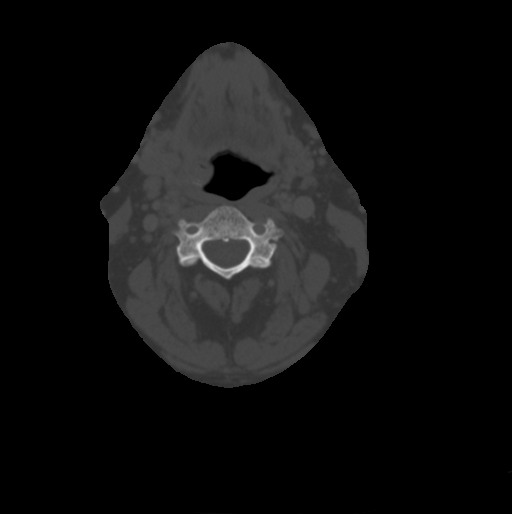

[6 of 14 positions shown; findings below may reference images not displayed]

FINDINGS: Pharynx and larynx: New asymmetric right tonsil mass bulging towards
the left and contacting the uvula. The mass measures up to 3.6 cm.
No submucosal tumor is seen.

Salivary glands: Asymmetric fatty atrophy at the left parotid tail.
Small right parotid and bilateral submandibular calculi.

Thyroid: Normal

Lymph nodes: None enlarged or abnormal density.

Vascular: Clips around the left carotid space, presumably
endarterectomy.

Limited intracranial: Negative

Visualized orbits: Not covered

Mastoids and visualized paranasal sinuses: Clear

Skeleton: No evidence of direct bony invasion or hematogenous
metastasis. Ordinary cervical spine degeneration for age.

Upper chest: Centrilobular emphysema and aortic atherosclerosis.
IMPRESSION: 1. 3.6 cm right tonsil mass most likely reflecting carcinoma. No
adenopathy or submucosal mass.
2. Aortic Atherosclerosis ([7E]-[7E]) and Emphysema ([7E]-[7E]).

## 2020-04-22 MED ORDER — IOPAMIDOL (ISOVUE-300) INJECTION 61%
75.0000 mL | Freq: Once | INTRAVENOUS | Status: AC | PRN
  Administered 2020-04-22: 75 mL via INTRAVENOUS

## 2020-05-03 NOTE — Progress Notes (Signed)
Oncology Nurse Navigator Documentation  Placed introductory call to new referral patient Liston Thum  Introduced myself as the H&N oncology nurse navigator that works with Dr. Isidore Moos to whom he has been referred by Dr. Wilburn Cornelia. He confirmed understanding of referral.  Briefly explained my role as his navigator, provided my contact information.   Confirmed understanding of upcoming appts and [place] location, explained arrival and registration process.  I explained the purpose of a dental evaluation prior to starting RT, indicated he would be contacted by [facility] to arrange an appt.    I encouraged him to call with questions/concerns as he moves forward with appts and procedures.    He verbalized understanding of information provided, expressed appreciation for my call.   Navigator Initial Assessment . 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  . Currently on FMLA / STD:no . Living Situation: He lives with his wife.  . Support System: wife, family.  Marland Kitchen PCP: Nicholes Rough PA . PCD: [facility] . Financial Concerns:no . Transportation Needs: no . Sensory Deficits:no . Language Barriers/Interpreter Needed:  no . Ambulation Needs: no . DME Used in Home: no . Psychosocial Needs:  no . Concerns/Needs Understanding Cancer:  addressed/answered by navigator to best of ability . Self-Expressed Needs: no   Harlow Asa RN, BSN, OCN Head & Neck Oncology Nurse [place] [facility] Phone # [phone]  Fax # [phone]

## 2020-05-03 NOTE — Progress Notes (Signed)
Head and Neck Cancer Location of Tumor / Histology:  Squamous cell carcinoma of RIGHT tonsil, p16(+)  Patient presented ~1 month ago with symptoms of: an ulcerated lesion involving the right tonsil noticed by patient's dentist. Patient was referred to Dr. Wilburn Cornelia who ordered a CT scan and then performed a biopsy at following visit. CT Neck w/ Contrast 04/22/2020 --IMPRESSION: 1. 3.6 cm right tonsil mass most likely reflecting carcinoma. No adenopathy or submucosal mass. 2. Aortic Atherosclerosis (ICD10-I70.0) and Emphysema (ICD10-J43.9).  Biopsies revealed:  04/26/2020 TONSIL, BIOPSY:                        Poorly differentiated carcinoma.              Immunostain for p16 is diffusely positive.              See comment Comment   --The tumor cells are positive for p16, p63, cytokeratin AE1/AE3, cytokeratin 5/6 with negative stains for cytokeratin 7, synaptophysin, chromogranin, CD3, CD20, ERG, S100 and p40.  The positive controls worked appropriately. --In summary, while the negativity for p40 is unusual, the other features are consistent with p16-positive squamous cell carcinoma.  Molecular testing for high risk HPV will be performed and reported separately. A second pathologist has reviewed the case to confirm the presence of tumor.  Nutrition Status Yes No Comments  Weight changes? [x]  []  Reports an unintentional weight loss of ~12 lbs (Reports appetite has waned significantly)  Swallowing concerns? []  [x]    PEG? []  [x]     Referrals Yes No Comments  Social Work? [x]  []    Dentistry? [x]  []    Swallowing therapy? [x]  []    Nutrition? [x]  []    Med/Onc? [x]  []     Safety Issues Yes No Comments  Prior radiation? []  [x]    Pacemaker/ICD? []  [x]    Possible current pregnancy? []  [x]  N/A  Is the patient on methotrexate? []  [x]     Tobacco/Marijuana/Snuff/ETOH use: Patient is a current every day smoker. Reports occasional alcohol consumption. Denies any smokeless tobacco or recreational  drug use  Past/Anticipated interventions by otolaryngology, if any:  04/26/2020 Dr. Jerrell Belfast Biopsy right tonsil  Past/Anticipated interventions by medical oncology, if any:  Referral still to be placed (will depend on PET scan results)   Current Complaints / other details:  Patient has received first three [place] vaccines

## 2020-05-06 ENCOUNTER — Telehealth: Payer: Self-pay | Admitting: Hematology and Oncology

## 2020-05-06 ENCOUNTER — Other Ambulatory Visit: Payer: Self-pay

## 2020-05-06 ENCOUNTER — Encounter: Payer: Self-pay | Admitting: Radiation Oncology

## 2020-05-06 ENCOUNTER — Ambulatory Visit
Admission: RE | Admit: 2020-05-06 | Discharge: 2020-05-06 | Disposition: A | Discharge status: Home / Self Care | Payer: Medicare Other | Source: Ambulatory Visit | Attending: Vascular Surgery | Admitting: Vascular Surgery

## 2020-05-06 ENCOUNTER — Ambulatory Visit
Admission: RE | Admit: 2020-05-06 | Discharge: 2020-05-06 | Disposition: A | Discharge status: Home / Self Care | Payer: Medicare Other | Source: Ambulatory Visit | Attending: Radiation Oncology | Admitting: Radiation Oncology

## 2020-05-06 ENCOUNTER — Telehealth: Payer: Self-pay | Admitting: *Deleted

## 2020-05-06 VITALS — BP 138/84 | HR 72 | Temp 97.0°F | Resp 18 | Ht 73.0 in | Wt 246.0 lb

## 2020-05-06 DIAGNOSIS — Z803 Family history of malignant neoplasm of breast: Secondary | ICD-10-CM | POA: Diagnosis not present

## 2020-05-06 DIAGNOSIS — C099 Malignant neoplasm of tonsil, unspecified: Secondary | ICD-10-CM

## 2020-05-06 DIAGNOSIS — F17210 Nicotine dependence, cigarettes, uncomplicated: Secondary | ICD-10-CM | POA: Insufficient documentation

## 2020-05-06 DIAGNOSIS — C091 Malignant neoplasm of tonsillar pillar (anterior) (posterior): Secondary | ICD-10-CM

## 2020-05-06 DIAGNOSIS — C090 Malignant neoplasm of tonsillar fossa: Secondary | ICD-10-CM

## 2020-05-06 MED ORDER — NICOTINE 7 MG/24HR TD PT24: 7.0000 mg | MEDICATED_PATCH | Freq: Every day | TRANSDERMAL | 0 refills | Status: DC

## 2020-05-06 MED ORDER — NICOTINE 14 MG/24HR TD PT24: 14.0000 mg | MEDICATED_PATCH | Freq: Every day | TRANSDERMAL | 0 refills | Status: DC

## 2020-05-06 MED ORDER — NICOTINE 21 MG/24HR TD PT24: 21.0000 mg | MEDICATED_PATCH | Freq: Every day | TRANSDERMAL | 2 refills | Status: DC

## 2020-05-06 MED ORDER — BUPROPION HCL ER (SR) 150 MG PO TB12: ORAL_TABLET | ORAL | 2 refills | Status: DC

## 2020-05-06 NOTE — Progress Notes (Signed)
Radiation Oncology         [phone]) [phone] ________________________________  Initial Outpatient Consultation  Name: Samuel Serrano MRN: [id number]  Date: 05/06/2020  DOB: 1950/07/21  HE:NIDP, Samuel Shirts, MD   REFERRING PHYSICIAN: Jerrell Belfast, MD  DIAGNOSIS:    ICD-10-CM   1. 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 Malignant neoplasm of tonsillar fossa (HCC)  C09.0    Cancer Staging Malignant neoplasm of tonsillar fossa (HCC) Staging form: Pharynx - HPV-Mediated Oropharynx, AJCC 8th Edition - Clinical stage from 05/06/2020: cT2, cN0, p16+ - Signed by Samuel Gibson, MD on 05/07/2020 Stage prefix: Initial diagnosis   CHIEF COMPLAINT: Here to discuss management of right tonsillar cancer  HISTORY OF PRESENT ILLNESS::Samuel Serrano is a 70 y.o. male who presented to his dentist one month ago, at which time an ulcerated right tonsillar lesion was noted.  Subsequently, the patient saw Dr. Wilburn Serrano, who performed a right tonsillar biopsy on 04/26/2020 that revealed: poorly differentiated carcinoma. Immunostain was diffusely positive for p16.  Pertinent imaging thus far includes soft tissue neck CT scan performed on 04/22/2020 revealing a 3.6 cm right tonsil mass that most likely reflected carcinoma. There was no adenopathy or submucosal mass.  I personally reviewed his images.  PET scan is pending.    Nutrition Status Yes No Comments  Weight changes? [x]  []  Reports an unintentional weight loss of ~12 lbs (Reports appetite has waned significantly)  Swallowing concerns? []  [x]    PEG? []  [x]     Referrals Yes No Comments  Social Work? [x]  []    Dentistry? [x]  []    Swallowing therapy? [x]  []    Nutrition? [x]  []     Med/Onc? [x]  []     Safety Issues Yes No Comments  Prior radiation? []  [x]    Pacemaker/ICD? []  [x]    Possible current pregnancy? []  [x]  N/A  Is the patient on methotrexate? []  [x]     Tobacco/Marijuana/Snuff/ETOH use: Patient is a current every day smoker. Reports occasional alcohol consumption. Denies any smokeless tobacco or recreational drug use  Past/Anticipated interventions by otolaryngology, if any:  04/26/2020 Dr. Jerrell Serrano Biopsy right tonsil  Past/Anticipated interventions by medical oncology, if any:  Referral still to be placed (will depend on PET scan results)  Current Complaints / other details:  Patient has received first three [place] vaccines He works full-time from home for his daughter, who works in [place]: No  PAST MEDICAL HISTORY:  has a past medical history of Carotid artery occlusion and Peripheral vascular disease (Samuel Serrano).    PAST SURGICAL HISTORY: Past Surgical History:  Procedure Laterality Date  . COLONOSCOPY    . CYST EXCISION     benign cysts in breasts at age 26  . ENDARTERECTOMY Left 07/24/2016   Procedure: Left Carotid ENDARTERECTOMY;  Surgeon: Samuel Posner, MD;  Location: [facility];  Service: Vascular;  Laterality: Left;  . PATCH ANGIOPLASTY Left 07/24/2016   Procedure: PATCH ANGIOPLASTY;  Surgeon: Samuel Posner, MD;  Location: [facility];  Service: Vascular;  Laterality: Left;  . WISDOM TOOTH EXTRACTION      FAMILY HISTORY: family history includes Breast cancer in his mother; Diabetes in his father; Hypertension  in his father.  SOCIAL HISTORY:  reports that he has been smoking cigarettes. He has been smoking about 0.00 packs per day. He has never used smokeless tobacco. He reports current alcohol use. He reports that he does not use drugs.  ALLERGIES: No known allergies  MEDICATIONS:  Current Outpatient Medications  Medication Sig Dispense Refill  . buPROPion (WELLBUTRIN SR) 150 MG 12 hr tablet Start one week  before quit date. Take 1 tab daily x 3 days, then 1 tab BID thereafter. 60 tablet 2  . hydrOXYzine (ATARAX/VISTARIL) 25 MG tablet Take 25-50 mg by mouth at bedtime as needed.    . nicotine (NICODERM CQ - DOSED IN MG/24 HOURS) 14 mg/24hr patch Place 1 patch (14 mg total) onto the skin daily. Use for 2 weeks, then taper to 7mg  dose. 14 patch 0  . nicotine (NICODERM CQ - DOSED IN MG/24 HOURS) 21 mg/24hr patch Place 1 patch (21 mg total) onto the skin daily. Use for 6 weeks, then taper to 14mg  dose. 14 patch 2  . nicotine (NICODERM CQ - DOSED IN MG/24 HR) 7 mg/24hr patch Place 1 patch (7 mg total) onto the skin daily. Use for 2 weeks. 14 patch 0   Current Facility-Administered Medications  Medication Dose Route Frequency Provider Last Rate Last Admin  . 0.9 %  sodium chloride infusion  500 mL Intravenous Once Pyrtle, Lajuan Lines, MD        REVIEW OF SYSTEMS:  Notable for that above.   PHYSICAL EXAM:  height is 6\' 1"  (1.854 m) and weight is 246 lb (111.6 kg). His temporal temperature is 97 F (36.1 C) (abnormal). His blood pressure is 138/84 and his pulse is 72. His respiration is 18 and oxygen saturation is 99%.   General: Alert and oriented, in no acute distress HEENT: Head is normocephalic. Extraocular movements are intact. Oropharynx is notable for an exophytic round tonsillar mass posterior to the right anterior tonsillar pillar and uvula, crossing midline  Neck: Neck is notable for no palpable adenopathy Heart: Regular in rate and rhythm with no murmurs, rubs, or gallops. Chest: Clear to auscultation bilaterally, with no rhonchi, wheezes, or rales. Abdomen: Soft, nontender, nondistended, with no rigidity or guarding. Extremities: No cyanosis; trace ankle edema  lymphatics: see Neck Exam Skin: No concerning lesions. Musculoskeletal: Ambulatory  neurologic: Cranial nerves II through XII are grossly intact. No obvious focalities. Speech is fluent. Coordination is intact. Psychiatric: Judgment and  insight are intact. Affect is appropriate.   ECOG = 1  0 - Asymptomatic (Fully active, able to carry on all predisease activities without restriction)  1 - Symptomatic but completely ambulatory (Restricted in physically strenuous activity but ambulatory and able to carry out work of a light or sedentary nature. For example, light housework, office work)  2 - Symptomatic, <50% in bed during the day (Ambulatory and capable of all self care but unable to carry out any work activities. Up and about more than 50% of waking hours)  3 - Symptomatic, >50% in bed, but not bedbound (Capable of only limited self-care, confined to bed or chair 50% or more of waking hours)  4 - Bedbound (Completely disabled. Cannot carry on any self-care. Totally confined to bed or chair)  5 - Death   Eustace Pen MM, Creech RH, Tormey DC, et al. [phone]). "Toxicity and response criteria of the [facility] Group". [place] Oncol. 5 (6): 649-55   LABORATORY DATA:  Lab Results  Component Value Date  WBC 4.6 01/07/2019   HGB 14.2 01/07/2019   HCT 42.9 01/07/2019   MCV 93.1 01/07/2019   PLT 147 (L) 01/07/2019   CMP     Component Value Date/Time   NA 136 01/07/2019 1130   K 3.9 01/07/2019 1130   CL 104 01/07/2019 1130   CO2 23 01/07/2019 1130   GLUCOSE 108 (H) 01/07/2019 1130   BUN 15 01/07/2019 1130   CREATININE 1.03 01/07/2019 1130   CALCIUM 8.8 (L) 01/07/2019 1130   PROT 7.3 07/22/2016 1440   ALBUMIN 4.5 07/22/2016 1440   AST 24 07/22/2016 1440   ALT 23 07/22/2016 1440   ALKPHOS 52 07/22/2016 1440   BILITOT 1.4 (H) 07/22/2016 1440   GFRNONAA >60 01/07/2019 1130   GFRAA >60 01/07/2019 1130      No results found for: TSH   RADIOGRAPHY: CT SOFT TISSUE NECK W CONTRAST  Result Date: 04/23/2020 CLINICAL DATA:  Tonsil lesion seen by dentist 4 weeks ago EXAM: CT NECK WITH CONTRAST TECHNIQUE: Multidetector CT imaging of the neck was performed using the standard protocol following the bolus  administration of intravenous contrast. CONTRAST:  73mL ISOVUE-300 IOPAMIDOL (ISOVUE-300) INJECTION 61% COMPARISON:  CTA of the neck 07/18/2016 FINDINGS: Pharynx and larynx: New asymmetric right tonsil mass bulging towards the left and contacting the uvula. The mass measures up to 3.6 cm. No submucosal tumor is seen. Salivary glands: Asymmetric fatty atrophy at the left parotid tail. Small right parotid and bilateral submandibular calculi. Thyroid: Normal Lymph nodes: None enlarged or abnormal density. Vascular: Clips around the left carotid space, presumably endarterectomy. Limited intracranial: Negative Visualized orbits: Not covered Mastoids and visualized paranasal sinuses: Clear Skeleton: No evidence of direct bony invasion or hematogenous metastasis. Ordinary cervical spine degeneration for age. Upper chest: Centrilobular emphysema and aortic atherosclerosis. IMPRESSION: 1. 3.6 cm right tonsil mass most likely reflecting carcinoma. No adenopathy or submucosal mass. 2. Aortic Atherosclerosis (ICD10-I70.0) and Emphysema (ICD10-J43.9). Electronically Signed   By: Monte Fantasia M.D.   On: 04/23/2020 06:51      IMPRESSION/PLAN: Right tonsillar cancer  I have run his case by the [facility] otolaryngology team.  He may be a TORS candidate.  The patient is interested in hearing about his options.  We will make a referral to the [facility] clinic in [place].  He understands that his options will depend somewhat on his PET results.  We still need to discern if he has any disease beyond his tonsil.  I anticipate that he will be a candidate for either definitive radiation, concurrent chemoradiation, or TORS surgery.  If he undergoes surgery he understands that there is a possibility that he will need postoperative radiation therapy.  The benefit of surgery is that it can sometimes be curative on its own (especially if disease is lymph node negative and lacking other risk factors) and surgery also reduces  the likelihood of needing chemotherapy.  If his disease remains T2 N0 in stage, then radiation alone is an excellent curative option for him.    We discussed the potential risks, benefits, and side effects of radiotherapy. We talked in detail about acute and late effects. We discussed that some of the most bothersome acute effects may be mucositis, dysgeusia, salivary changes, skin irritation, hair loss, dehydration, weight loss and fatigue. We talked about late effects which include but are not necessarily limited to rare death , dysphagia, hypothyroidism, nerve injury, vascular injury, spinal cord injury, xerostomia, trismus, neck edema, and potential injury to any of the  tissues in the head and neck region. No guarantees of treatment were given. A consent form was signed and placed in the patient's medical record. The patient is enthusiastic about proceeding with treatment. I look forward to participating in the patient's care.    We also discussed that the treatment of head and neck cancer is a multidisciplinary process to maximize treatment outcomes and quality of life. For this reason the following referrals have been or will be made:   Medical oncology to discuss chemotherapy    Dentistry for dental evaluation, possible extractions in the radiation fields, and /or advice on reducing risk of cavities, osteoradionecrosis, or other oral issues.   Nutritionist for nutrition support during and after treatment.   Speech language pathology for swallowing and/or speech therapy.   Social work for social support.    Physical therapy due to risk of lymphedema in neck and deconditioning.   Baseline labs including TSH.  I asked the patient today about tobacco use. The patient uses tobacco.  I advised the patient to quit. Services were offered by me today including outpatient counseling and pharmacotherapy. I assessed for the willingness to attempt to quit and provided encouragement and demonstrated  willingness to make referrals and/or prescriptions to help the patient attempt to quit. The patient has follow-up with the oncologic team to touch base on their tobacco use and /or cessation efforts.  Over 3 minutes were spent on this issue.  He has accepted prescriptions for nicotine patches and bupropion.  He plans to quit May 2.   On date of service, in total, I spent 60 minutes on this encounter. Patient was seen in person.  __________________________________________   Samuel Gibson, MD  This document serves as a record of services personally performed by Samuel Gibson, MD. It was created on his behalf by Clerance Lav, a trained medical scribe. The creation of this record is based on the scribe's personal observations and the provider's statements to them. This document has been checked and approved by the attending provider.

## 2020-05-06 NOTE — Telephone Encounter (Signed)
Received a new pt referral from Dr. Isidore Moos for Squamous cell carcinoma of tonsil. [name] has been scheduled to see Dr. Chryl Heck on 5/6 at 10am.  Pt has been cld and made aware of the appt date and time.

## 2020-05-06 NOTE — Telephone Encounter (Signed)
CALLED PATIENT TO INFORM OF PET SCAN FOR 05-14-20- ARRIVAL TIME- 8:30 AM @ [facility], PATIENT TO BE NPO- 6 HRS. PRIOR TO TEST, SPOKE WITH PATIENT AND HE IS AWARE OF THIS TEST.

## 2020-05-06 NOTE — Progress Notes (Signed)
Oncology Nurse Navigator Documentation  Met with patient during initial consult with Dr. Isidore Moos. He was accompanied by his wife Peter Congo.  . Further introduced myself as his/their Navigator, explained my role as a member of the Care Team. . Provided New Patient Information packet: o Contact information for physician, this navigator, other members of the Care Team o Advance Directive information ([place] blue pamphlet with LCSW insert); provided [facility] AD booklet at his request,  o Fall Prevention Patient [place] sheet o Symptom Management Clinic information o [facility] campus map with highlight of [place] o SLP Information sheet . Provided and discussed educational handouts for PEG and PAC. Marland Kitchen Assisted with post-consult appt scheduling. They are aware that he will be scheduled for a PET scan, an appointment at [facility] to discuss surgery, an appointment with Dr. Benson Norway of dental medicine, and an appointment with Dr. Chryl Heck of Medical oncology.  . They verbalized understanding of information provided. . I encouraged them to call with questions/concerns moving forward.  Harlow Asa, RN, BSN, OCN Head & Neck Oncology Nurse [place] at [place] [phone]

## 2020-05-07 ENCOUNTER — Encounter: Payer: Self-pay | Admitting: Radiation Oncology

## 2020-05-07 DIAGNOSIS — C090 Malignant neoplasm of tonsillar fossa: Secondary | ICD-10-CM | POA: Insufficient documentation

## 2020-05-07 NOTE — Progress Notes (Signed)
Dental Form with Estimates of Radiation Dose      Diagnosis:    ICD-10-CM   1. 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 Malignant neoplasm of tonsillar fossa (HCC)  C09.0     Prognosis: curative pending PET  Anticipated # of fractions: might undergo TORS - otherwise 35 fx and possibly post op RT.  Daily?: yes  # of weeks of radiotherapy: 6-7  Chemotherapy?: ? TBD  Anticipated xerostomia:  Mild permanent   Pre-simulation needs:    Scatter protection    Simulation: ASAP    Other Notes: Pending PET scan and towards evaluation Please contact Eppie Gibson, MD, with patient's disposition after evaluation and/or dental treatment.

## 2020-05-14 ENCOUNTER — Other Ambulatory Visit: Payer: Self-pay

## 2020-05-14 ENCOUNTER — Ambulatory Visit (HOSPITAL_COMMUNITY)
Admission: RE | Admit: 2020-05-14 | Discharge: 2020-05-14 | Disposition: A | Discharge status: Home / Self Care | Payer: Medicare Other | Source: Ambulatory Visit | Attending: Radiation Oncology | Admitting: Radiation Oncology

## 2020-05-14 DIAGNOSIS — I2510 Atherosclerotic heart disease of native coronary artery without angina pectoris: Secondary | ICD-10-CM | POA: Insufficient documentation

## 2020-05-14 DIAGNOSIS — J439 Emphysema, unspecified: Secondary | ICD-10-CM | POA: Insufficient documentation

## 2020-05-14 DIAGNOSIS — I700 Atherosclerosis of aorta: Secondary | ICD-10-CM | POA: Insufficient documentation

## 2020-05-14 DIAGNOSIS — C091 Malignant neoplasm of tonsillar pillar (anterior) (posterior): Secondary | ICD-10-CM | POA: Insufficient documentation

## 2020-05-14 LAB — GLUCOSE, CAPILLARY: Glucose-Capillary: 103 mg/dL — ABNORMAL HIGH (ref 70–99)

## 2020-05-14 IMAGING — CT NM PET TUM IMG INITIAL (PI) SKULL BASE T - THIGH
8 series · 21 of 25 positions shown · non-contrast
Comparison: [DATE]

CLINICAL DATA: Initial treatment strategy for head neck cancer.

EXAM:
NUCLEAR MEDICINE PET SKULL BASE TO THIGH
TECHNIQUE: 12.2 mCi F-18 FDG was injected intravenously. Full-ring PET imaging
was performed from the skull base to thigh after the radiotracer. CT
data was obtained and used for attenuation correction and anatomic
localization.
Fasting blood glucose: 103 mg/dl

[Series 3: pet hn_sk_thigh ac · axial · 5.0mm · 4.07mm/px · z∈[-1519,-467]mm · 4 of 264 slices shown]
[im 1/264]
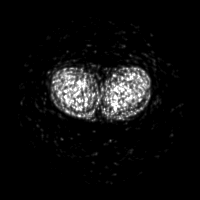
[im 66/264]
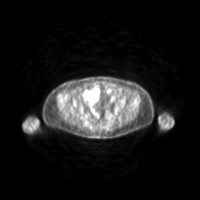
[im 132/264]
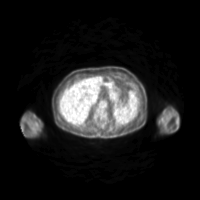
[im 264/264]
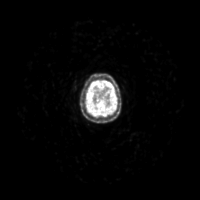

[Series 4: ct hn_sk_th 5.0 bf37 · axial · 5.0mm · 0.98mm/px · z∈[-1519,-731]mm · 4 of 264 slices shown]
[im 1/264]
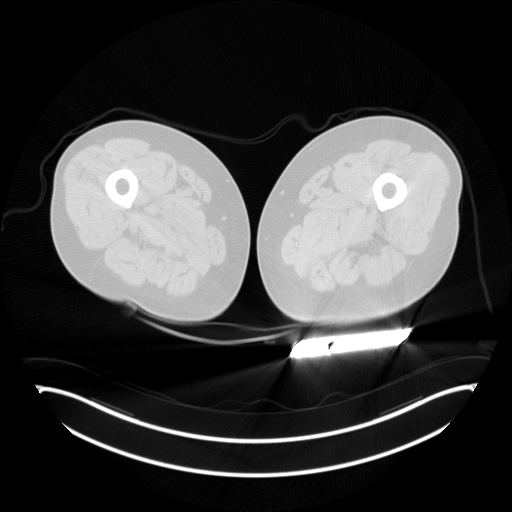
[im 66/264]
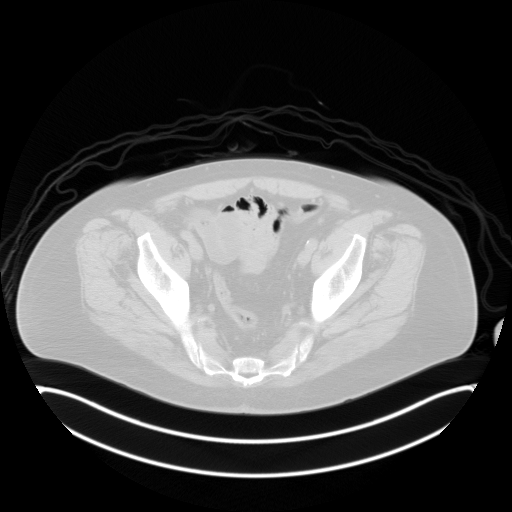
[im 132/264]
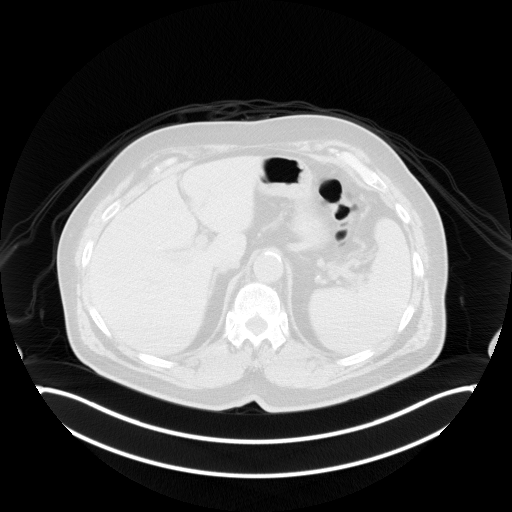
[im 198/264]
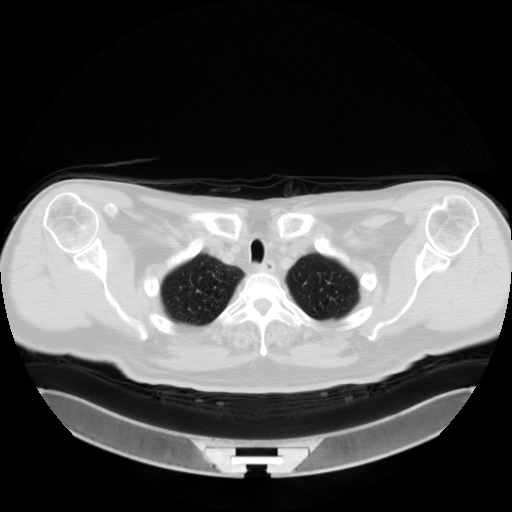

[Series 5: pet hn_sk_thigh nac · axial · 5.0mm · 4.07mm/px · z∈[-1519,-467]mm · 5 of 264 slices shown]
[im 1/264]
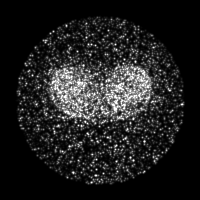
[im 66/264]
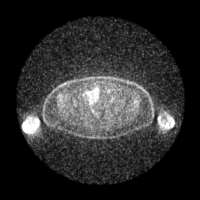
[im 132/264]
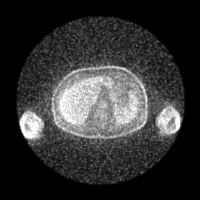
[im 198/264]
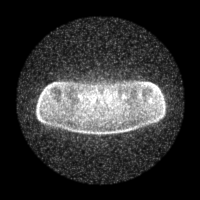
[im 264/264]
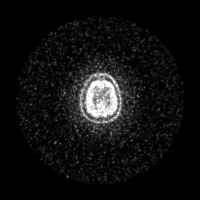

[Series 8: ct hn_sk_th 5.0 br59 lung_bone · axial · 5.0mm · 0.74mm/px · 1 of 81 slices shown]
[im 81/81]
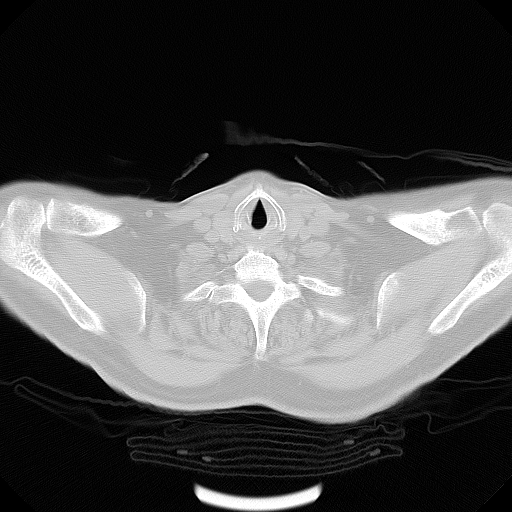

[Series 603: <mip collection> · coronal · 2.18mm/px · 1 of 32 slices shown]
[im 1/32]
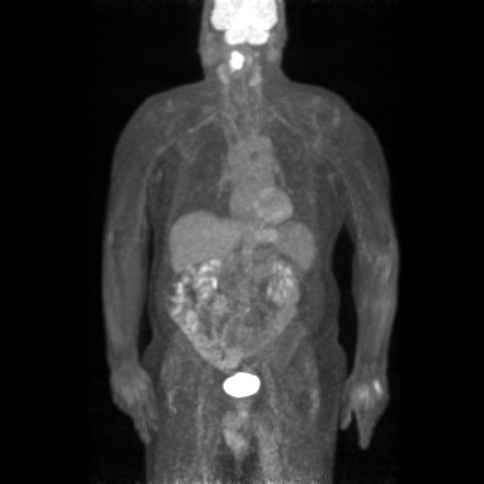

[Series 604: fused cor · 1 of 40 slices shown]
[im 1/40]
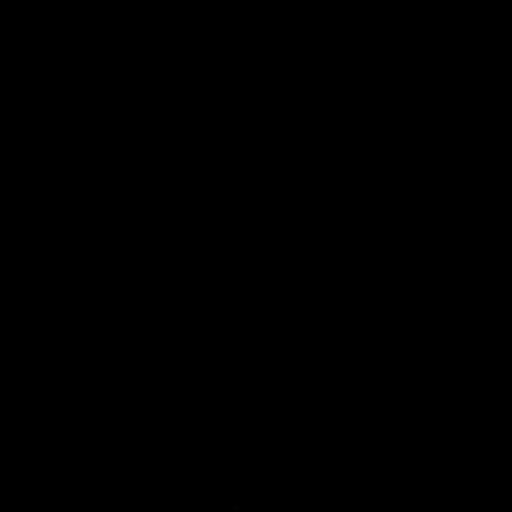

[Series 605: range-ct hn_sk_th 5.0 bf37-tra-<alpha range> · 4 of 258 slices shown]
[im 1/258]
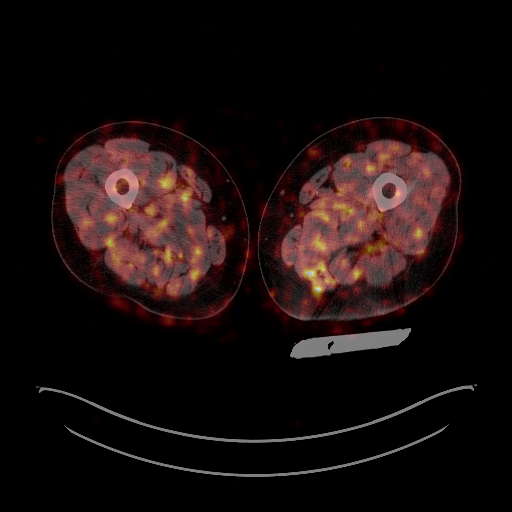
[im 65/258]
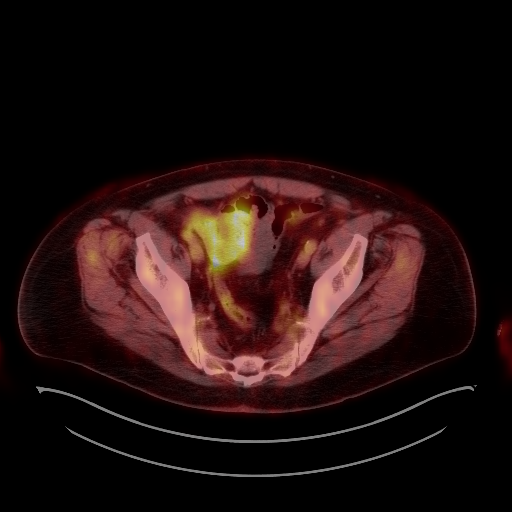
[im 193/258]
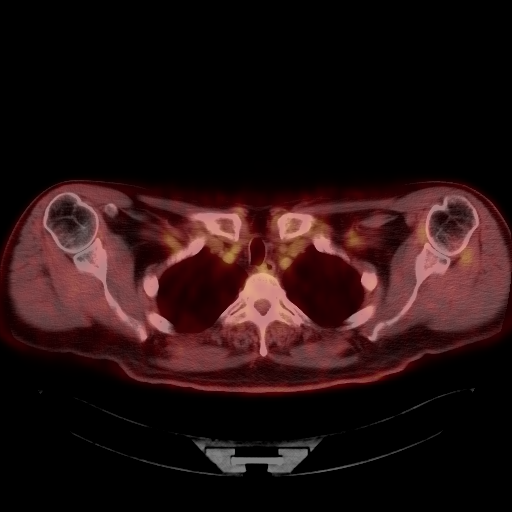
[im 258/258]
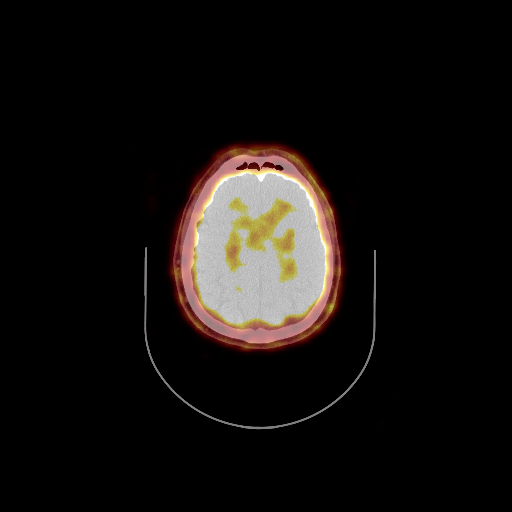

[Series 1085: results mm oncology reading · 1.0mm · 0.45mm/px · 1 of 5 slices shown]
[im 1/5]
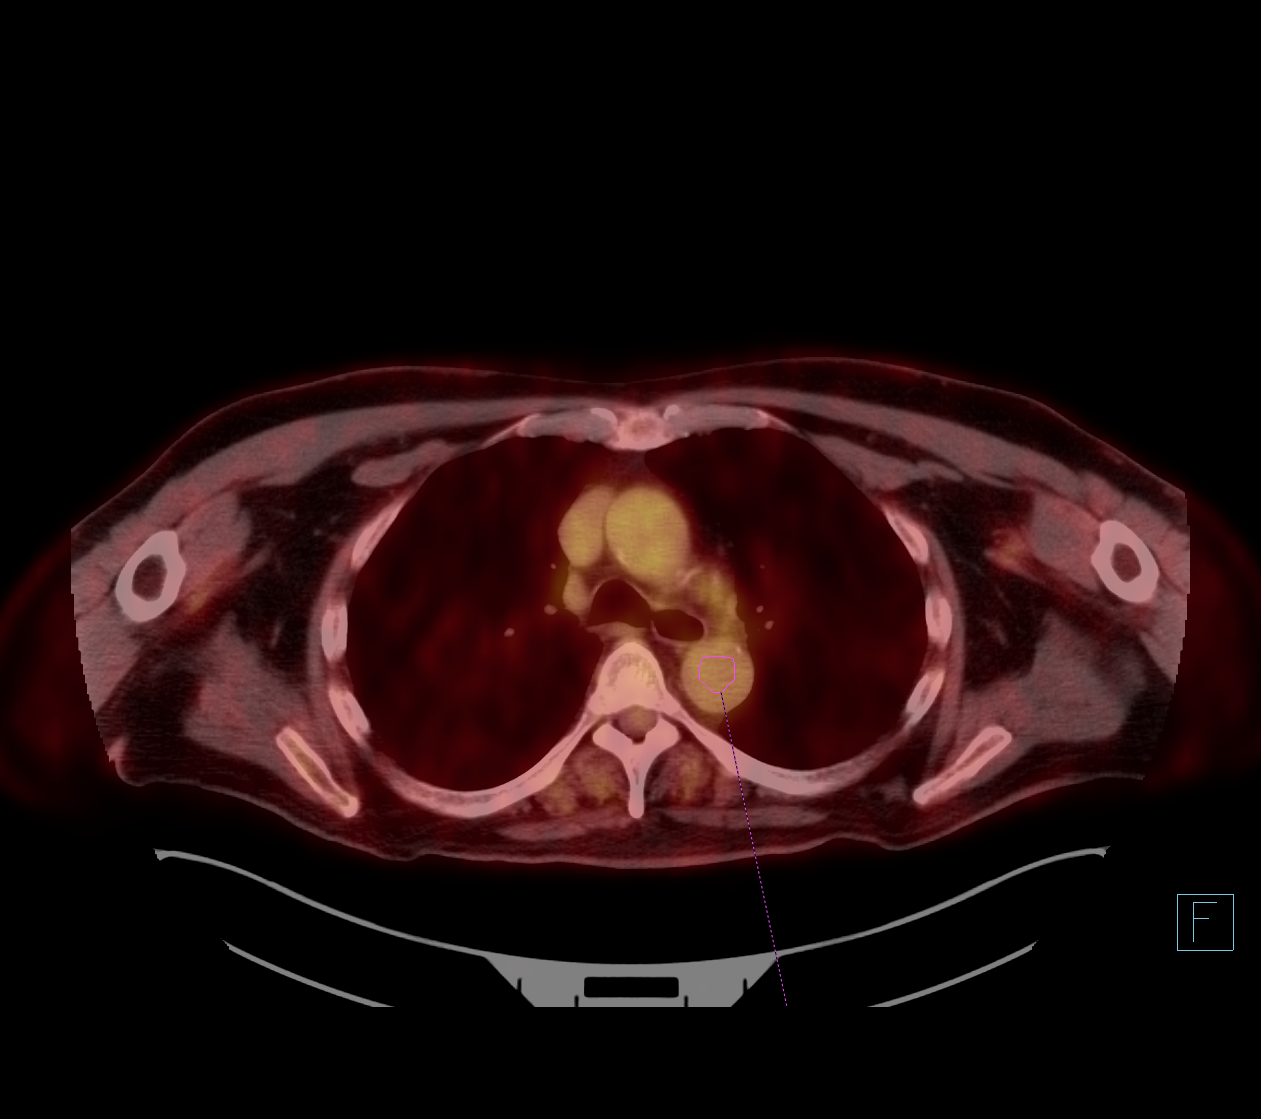

[21 of 25 positions shown; findings below may reference images not displayed]

FINDINGS: Mediastinal blood pool activity: SUV max

Liver activity: SUV max NA

NECK: RIGHT tonsillar mass measuring approximately 3.2 x 2.2 cm with
a maximum SUV of 17.8 open parent image 35/4)

RIGHT level II lymph node (image 39/4) 8 mm with a maximum SUV of
4.4 8 mm short axis.

RIGHT level II lymph node (image 43/4) maximum SUV of 5.0, 10 mm
short axis.

Small 5 mm lymph node at RIGHT level II B (image 40) 2.6 maximum
SUV. Small lymph nodes tracking along the RIGHT level 2 B chain and
into upper RIGHT level III with a small rounded node on image 49 of
series 4 at 6 mm. None of these nodes show uptake above mediastinal
blood pool.

No contralateral hypermetabolic nodal disease

Incidental CT findings: none

CHEST: No hypermetabolic mediastinal or hilar nodes. No suspicious
pulmonary nodules on the CT scan.

Incidental CT findings: Calcified atheromatous plaque of the
thoracic aorta. No aneurysmal dilation. Normal caliber of the
central pulmonary vasculature. Two vessel coronary artery
calcification select 2 vessel coronary artery calcification
three-vessel coronary artery calcification. No adenopathy by size
criteria. Evidence of calcified granulomatous disease with calcified
lymph node in the hilum select hilum subcarinal region. Mild
pulmonary emphysema at the lung apices. No consolidation or
effusion.

ABDOMEN/PELVIS: No abnormal hypermetabolic activity within the
liver, pancreas, adrenal glands, or spleen. No hypermetabolic lymph
nodes in the abdomen or pelvis.

Incidental CT findings: none

SKELETON: No focal hypermetabolic activity to suggest skeletal
metastasis.

Incidental CT findings: none
IMPRESSION: Findings of head neck cancer with large RIGHT tonsillar mass bulging
towards the LEFT as seen on previous head neck CT with marked
hypermetabolic activity and signs of local nodal involvement.

No metastatic disease to the chest, abdomen or pelvis.

RIGHT level II B lymph nodes tracking towards level 3 less than a
cm, equivocal with less than mediastinal blood pool activity. No
contralateral nodal involvement.

Signs of aortic atherosclerosis and coronary artery disease.

Pulmonary emphysema.

## 2020-05-14 MED ORDER — FLUDEOXYGLUCOSE F - 18 (FDG) INJECTION
12.3000 | Freq: Once | INTRAVENOUS | Status: AC
  Administered 2020-05-14: 12.2 via INTRAVENOUS

## 2020-05-15 ENCOUNTER — Encounter: Payer: Self-pay | Admitting: Radiation Oncology

## 2020-05-15 NOTE — Progress Notes (Signed)
I spoke with the patient today to share his PET scan results.  He was leaving Dr. Jenne [facility] clinic when I called.  He reported that he just met Dr. Conley Canal in consultation and will be undergoing surgery on Friday.  Therefore I will let Anderson Malta, our navigator know that she can cancel his appointments for Friday.   Anderson Malta our navigator will share his pathology results with me when they are available so that we can figure out what adjuvant treatments and appointments are appropriate   I wish the patient the best in his upcoming surgery .  -----------------------------------  Eppie Gibson, MD

## 2020-05-17 ENCOUNTER — Inpatient Hospital Stay: Payer: Medicare Other

## 2020-05-17 ENCOUNTER — Inpatient Hospital Stay: Payer: Medicare Other | Admitting: Hematology and Oncology

## 2020-05-20 ENCOUNTER — Other Ambulatory Visit (HOSPITAL_COMMUNITY): Payer: Medicare Other | Admitting: Dentistry

## 2020-08-22 ENCOUNTER — Inpatient Hospital Stay (HOSPITAL_BASED_OUTPATIENT_CLINIC_OR_DEPARTMENT_OTHER)
Admission: EM | Admit: 2020-08-22 | Discharge: 2020-08-23 | DRG: 066 | Disposition: A | Discharge status: Home / Self Care | Payer: Medicare Other | Attending: Internal Medicine | Admitting: Internal Medicine

## 2020-08-22 ENCOUNTER — Other Ambulatory Visit: Payer: Self-pay

## 2020-08-22 ENCOUNTER — Encounter (HOSPITAL_BASED_OUTPATIENT_CLINIC_OR_DEPARTMENT_OTHER): Payer: Self-pay

## 2020-08-22 ENCOUNTER — Emergency Department (HOSPITAL_COMMUNITY): Payer: Medicare Other

## 2020-08-22 ENCOUNTER — Emergency Department (HOSPITAL_COMMUNITY)
Admission: EM | Admit: 2020-08-22 | Discharge: 2020-08-22 | Disposition: A | Discharge status: Home / Self Care | Payer: Medicare Other | Source: Home / Self Care

## 2020-08-22 ENCOUNTER — Encounter (HOSPITAL_COMMUNITY): Payer: Self-pay

## 2020-08-22 DIAGNOSIS — Z6832 Body mass index (BMI) 32.0-32.9, adult: Secondary | ICD-10-CM | POA: Diagnosis not present

## 2020-08-22 DIAGNOSIS — I6389 Other cerebral infarction: Principal | ICD-10-CM | POA: Diagnosis present

## 2020-08-22 DIAGNOSIS — Z8249 Family history of ischemic heart disease and other diseases of the circulatory system: Secondary | ICD-10-CM

## 2020-08-22 DIAGNOSIS — E669 Obesity, unspecified: Secondary | ICD-10-CM | POA: Diagnosis present

## 2020-08-22 DIAGNOSIS — Z85819 Personal history of malignant neoplasm of unspecified site of lip, oral cavity, and pharynx: Secondary | ICD-10-CM | POA: Diagnosis not present

## 2020-08-22 DIAGNOSIS — I639 Cerebral infarction, unspecified: Secondary | ICD-10-CM

## 2020-08-22 DIAGNOSIS — E785 Hyperlipidemia, unspecified: Secondary | ICD-10-CM | POA: Diagnosis present

## 2020-08-22 DIAGNOSIS — E7800 Pure hypercholesterolemia, unspecified: Secondary | ICD-10-CM | POA: Diagnosis not present

## 2020-08-22 DIAGNOSIS — Z7902 Long term (current) use of antithrombotics/antiplatelets: Secondary | ICD-10-CM

## 2020-08-22 DIAGNOSIS — R202 Paresthesia of skin: Secondary | ICD-10-CM | POA: Insufficient documentation

## 2020-08-22 DIAGNOSIS — R200 Anesthesia of skin: Secondary | ICD-10-CM | POA: Diagnosis present

## 2020-08-22 DIAGNOSIS — R791 Abnormal coagulation profile: Secondary | ICD-10-CM | POA: Insufficient documentation

## 2020-08-22 DIAGNOSIS — J439 Emphysema, unspecified: Secondary | ICD-10-CM | POA: Diagnosis present

## 2020-08-22 DIAGNOSIS — I69369 Other paralytic syndrome following cerebral infarction affecting unspecified side: Secondary | ICD-10-CM | POA: Diagnosis not present

## 2020-08-22 DIAGNOSIS — Z8589 Personal history of malignant neoplasm of other organs and systems: Secondary | ICD-10-CM | POA: Diagnosis not present

## 2020-08-22 DIAGNOSIS — I6523 Occlusion and stenosis of bilateral carotid arteries: Secondary | ICD-10-CM | POA: Diagnosis not present

## 2020-08-22 DIAGNOSIS — F17210 Nicotine dependence, cigarettes, uncomplicated: Secondary | ICD-10-CM | POA: Diagnosis not present

## 2020-08-22 DIAGNOSIS — I739 Peripheral vascular disease, unspecified: Secondary | ICD-10-CM | POA: Diagnosis not present

## 2020-08-22 DIAGNOSIS — I10 Essential (primary) hypertension: Secondary | ICD-10-CM | POA: Diagnosis present

## 2020-08-22 DIAGNOSIS — R9431 Abnormal electrocardiogram [ECG] [EKG]: Secondary | ICD-10-CM

## 2020-08-22 DIAGNOSIS — Z20822 Contact with and (suspected) exposure to covid-19: Secondary | ICD-10-CM | POA: Diagnosis not present

## 2020-08-22 DIAGNOSIS — I2510 Atherosclerotic heart disease of native coronary artery without angina pectoris: Secondary | ICD-10-CM | POA: Diagnosis present

## 2020-08-22 DIAGNOSIS — M5030 Other cervical disc degeneration, unspecified cervical region: Secondary | ICD-10-CM

## 2020-08-22 DIAGNOSIS — Z833 Family history of diabetes mellitus: Secondary | ICD-10-CM | POA: Diagnosis not present

## 2020-08-22 DIAGNOSIS — Z720 Tobacco use: Secondary | ICD-10-CM | POA: Diagnosis not present

## 2020-08-22 DIAGNOSIS — Z5321 Procedure and treatment not carried out due to patient leaving prior to being seen by health care provider: Secondary | ICD-10-CM | POA: Insufficient documentation

## 2020-08-22 HISTORY — DX: Malignant (primary) neoplasm, unspecified: C80.1

## 2020-08-22 HISTORY — DX: Malignant neoplasm of pharynx, unspecified: C14.0

## 2020-08-22 LAB — COMPREHENSIVE METABOLIC PANEL
ALT: 13 U/L (ref 0–44)
AST: 23 U/L (ref 15–41)
Albumin: 3.7 g/dL (ref 3.5–5.0)
Alkaline Phosphatase: 56 U/L (ref 38–126)
Anion gap: 8 (ref 5–15)
BUN: 17 mg/dL (ref 8–23)
CO2: 26 mmol/L (ref 22–32)
Calcium: 9.1 mg/dL (ref 8.9–10.3)
Chloride: 107 mmol/L (ref 98–111)
Creatinine, Ser: 1.09 mg/dL (ref 0.61–1.24)
GFR, Estimated: >60 mL/min (ref >60)
Glucose, Bld: 118 mg/dL — ABNORMAL HIGH (ref 70–99)
Potassium: 4 mmol/L (ref 3.5–5.1)
Sodium: 141 mmol/L (ref 135–145)
Total Bilirubin: 0.7 mg/dL (ref 0.3–1.2)
Total Protein: 6.5 g/dL (ref 6.5–8.1)

## 2020-08-22 LAB — URINALYSIS, ROUTINE W REFLEX MICROSCOPIC
Bilirubin Urine: NEGATIVE
Glucose, UA: NEGATIVE mg/dL
Hgb urine dipstick: NEGATIVE
Ketones, ur: NEGATIVE mg/dL
Leukocytes,Ua: NEGATIVE
Nitrite: NEGATIVE
Protein, ur: NEGATIVE mg/dL
Specific Gravity, Urine: 1.025 (ref 1.005–1.030)
pH: 5 (ref 5.0–8.0)

## 2020-08-22 LAB — DIFFERENTIAL
Abs Immature Granulocytes: 0.03 10*3/uL (ref 0.00–0.07)
Basophils Absolute: 0.1 10*3/uL (ref 0.0–0.1)
Basophils Relative: 1 %
Eosinophils Absolute: 0.2 10*3/uL (ref 0.0–0.5)
Eosinophils Relative: 4 %
Immature Granulocytes: 1 %
Lymphocytes Relative: 38 %
Lymphs Abs: 2.4 10*3/uL (ref 0.7–4.0)
Monocytes Absolute: 0.7 10*3/uL (ref 0.1–1.0)
Monocytes Relative: 11 %
Neutro Abs: 2.9 10*3/uL (ref 1.7–7.7)
Neutrophils Relative %: 45 %

## 2020-08-22 LAB — I-STAT CHEM 8, ED
BUN: 22 mg/dL (ref 8–23)
Calcium, Ion: 1.03 mmol/L — ABNORMAL LOW (ref 1.15–1.40)
Chloride: 109 mmol/L (ref 98–111)
Creatinine, Ser: 1.1 mg/dL (ref 0.61–1.24)
Glucose, Bld: 111 mg/dL — ABNORMAL HIGH (ref 70–99)
HCT: 37 % — ABNORMAL LOW (ref 39.0–52.0)
Hemoglobin: 12.6 g/dL — ABNORMAL LOW (ref 13.0–17.0)
Potassium: 4.6 mmol/L (ref 3.5–5.1)
Sodium: 142 mmol/L (ref 135–145)
TCO2: 26 mmol/L (ref 22–32)

## 2020-08-22 LAB — CBC
HCT: 39.2 % (ref 39.0–52.0)
Hemoglobin: 13 g/dL (ref 13.0–17.0)
MCH: 31 pg (ref 26.0–34.0)
MCHC: 33.2 g/dL (ref 30.0–36.0)
MCV: 93.3 fL (ref 80.0–100.0)
Platelets: 191 10*3/uL (ref 150–400)
RBC: 4.2 MIL/uL — ABNORMAL LOW (ref 4.22–5.81)
RDW: 13.2 % (ref 11.5–15.5)
WBC: 6.3 10*3/uL (ref 4.0–10.5)
nRBC: 0 % (ref 0.0–0.2)

## 2020-08-22 LAB — PROTIME-INR
INR: 1 (ref 0.8–1.2)
Prothrombin Time: 13.5 seconds (ref 11.4–15.2)

## 2020-08-22 LAB — APTT: aPTT: 30 seconds (ref 24–36)

## 2020-08-22 IMAGING — MR MR HEAD W/O CM
12 of 13 series · 44 of 48 positions shown · non-contrast
Comparison: CT from earlier the same day.

CLINICAL DATA: Initial evaluation for left facial and upper
extremity numbness for 4 days.

EXAM:
MRI HEAD WITHOUT CONTRAST
TECHNIQUE: Multiplanar, multiecho pulse sequences of the brain and surrounding
structures were obtained without intravenous contrast.

[Series 5: DWI · axial · 3.0mm · 0.92mm/px · z∈[-80,+71]mm · 8 of 104 slices shown (1 of 4)]
[im 1/104]
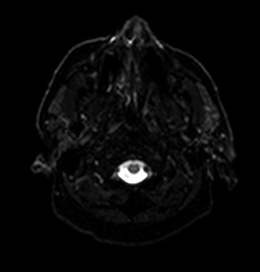
[im 15/104]
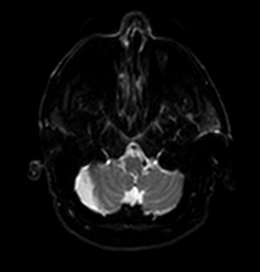
[im 30/104]
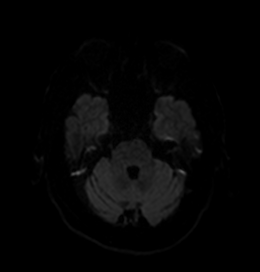
[im 45/104]
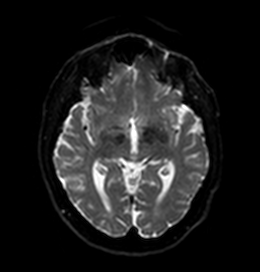
[im 59/104]
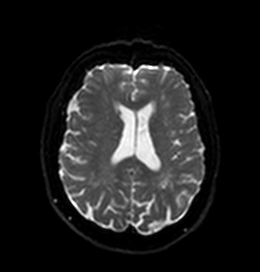
[im 74/104]
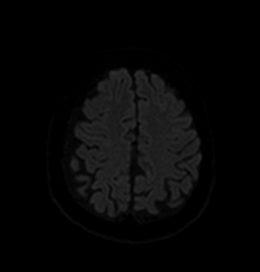
[im 89/104]
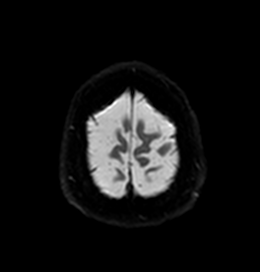
[im 104/104]
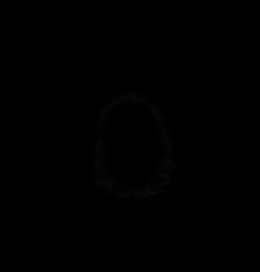

[Series 6: DWI · axial · 3.0mm · 0.92mm/px · z∈[-80,+71]mm · 4 of 52 slices shown (2 of 4)]
[im 1/52]
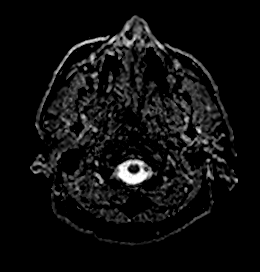
[im 18/52]
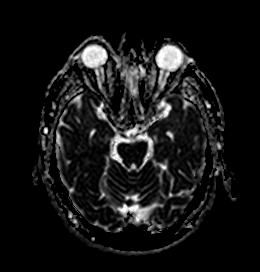
[im 35/52]
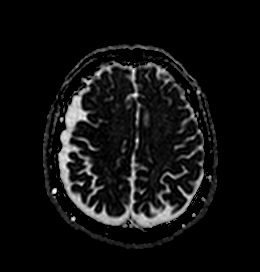
[im 52/52]
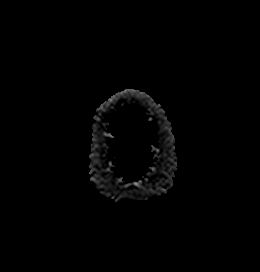

[Series 7: DWI · coronal · 4.0mm · 0.88mm/px · 5 of 72 slices shown (3 of 4)]
[im 1/72]
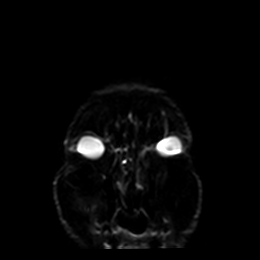
[im 18/72]
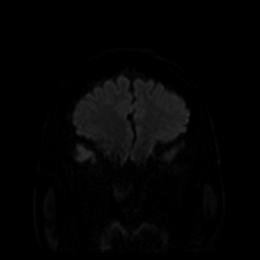
[im 36/72]
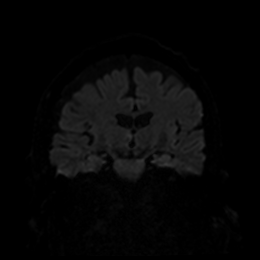
[im 54/72]
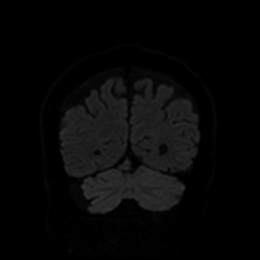
[im 72/72]
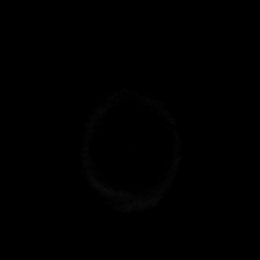

[Series 8: DWI · coronal · 4.0mm · 0.88mm/px · 3 of 36 slices shown (4 of 4)]
[im 1/36]
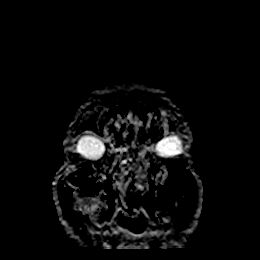
[im 18/36]
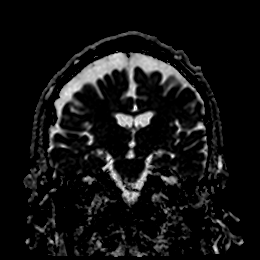
[im 36/36]
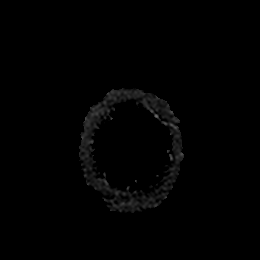

[Series 9: T1 · sagittal · 5.0mm · 0.75mm/px · 2 of 25 slices shown]
[im 1/25]
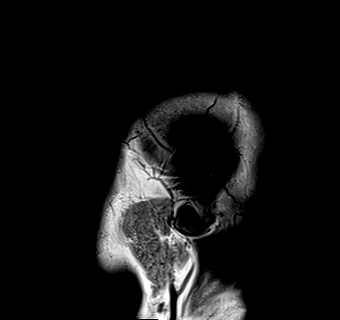
[im 25/25]
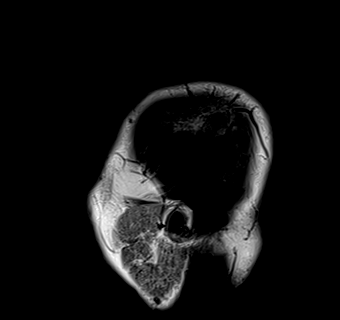

[Series 10: T2 · axial · 5.0mm · 0.75mm/px · z∈[-74,+68]mm · 2 of 25 slices shown (1 of 2)]
[im 1/25]
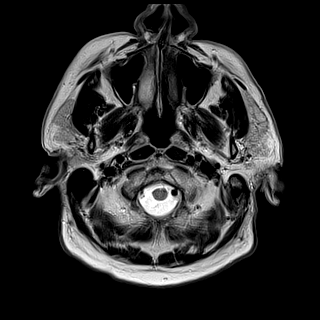
[im 25/25]
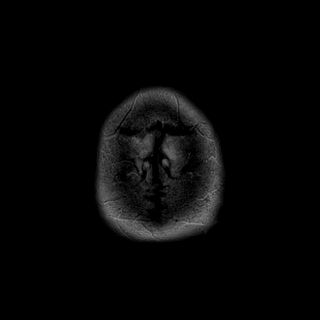

[Series 11: FLAIR · axial · 5.0mm · 0.45mm/px · z∈[-75,+68]mm · 2 of 25 slices shown]
[im 1/25]
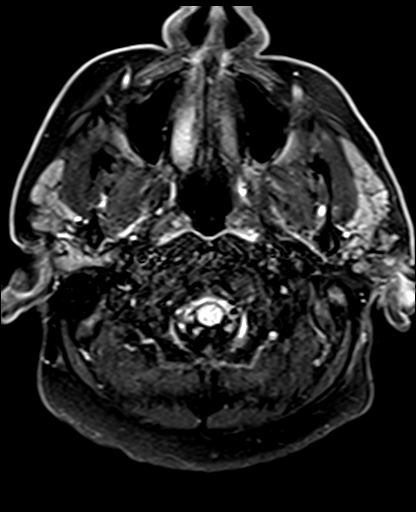
[im 25/25]
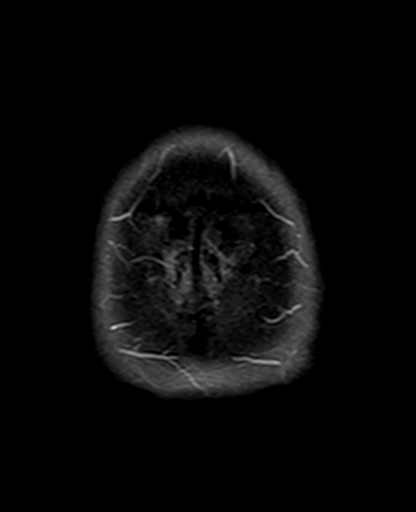

[Series 12: mag_images · axial · 3.0mm · 0.94mm/px · z∈[-85,+78]mm · 4 of 56 slices shown]
[im 1/56]
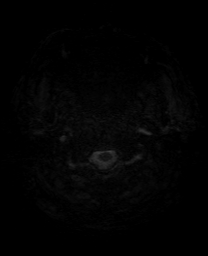
[im 19/56]
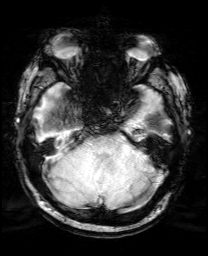
[im 37/56]
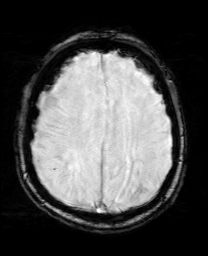
[im 56/56]
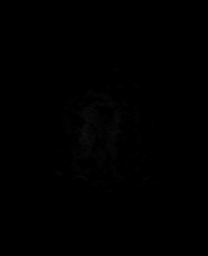

[Series 13: pha_images · axial · 3.0mm · 0.94mm/px · z∈[-85,+78]mm · 4 of 56 slices shown]
[im 1/56]
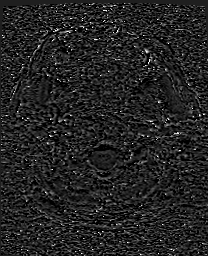
[im 19/56]
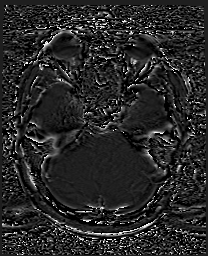
[im 37/56]
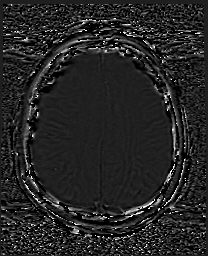
[im 56/56]
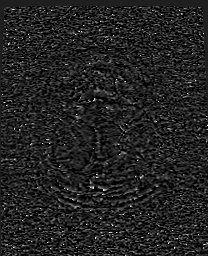

[Series 14: swi_images · axial · 3.0mm · 0.94mm/px · z∈[-85,+78]mm · 4 of 56 slices shown]
[im 1/56]
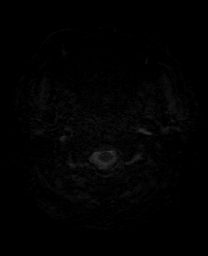
[im 19/56]
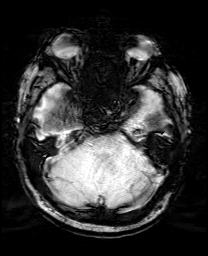
[im 37/56]
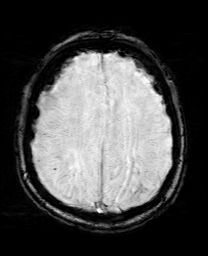
[im 56/56]
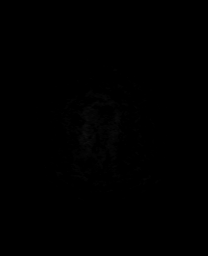

[Series 15: mip_images(sw) · axial · 24.0mm · 0.94mm/px · z∈[-75,+67]mm · 4 of 49 slices shown]
[im 1/49]
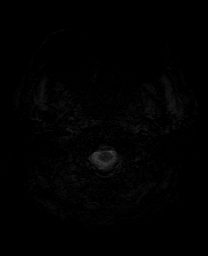
[im 17/49]
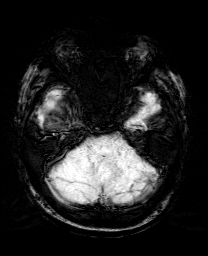
[im 33/49]
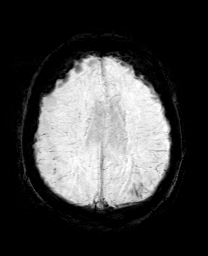
[im 49/49]
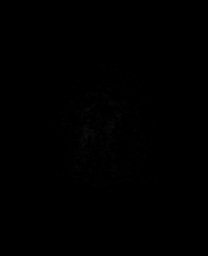

[Series 17: T2 · coronal · 5.0mm · 0.34mm/px · 2 of 30 slices shown (2 of 2)]
[im 1/30]
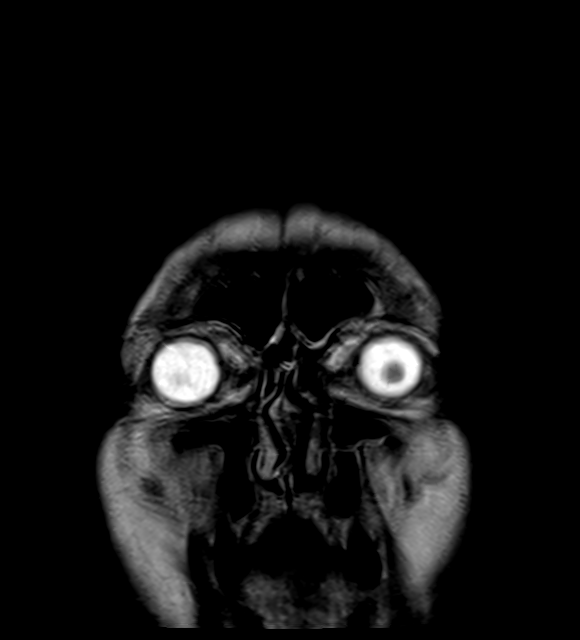
[im 30/30]
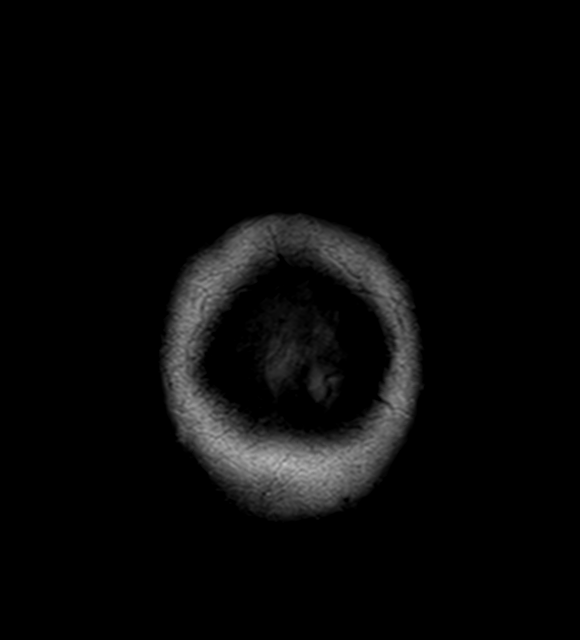

[44 of 48 positions shown; findings below may reference images not displayed]

FINDINGS: Brain: Diffuse prominence of the CSF containing spaces compatible
with moderately advanced cerebral and cerebellar atrophy, more
pronounced on the right. Patchy T2/FLAIR hyperintensity within the
periventricular deep white matter both cerebral hemispheres most
consistent with chronic small vessel ischemic disease, moderately
advanced in nature. Small area of encephalomalacia and gliosis
involving the cortical and subcortical left parietal lobe consistent
with a chronic ischemic infarct (series 11, image 19). Associated
chronic hemosiderin staining present within this region.

There is a well demarcated somewhat elongated focus of diffusion
signal abnormality involving medial right thalamus measuring 1.9 cm
(series 5, image 78). Associated T2/FLAIR signal hyperintensity
without discernible ADC correlate. Finding is favored to reflect an
evolving subacute ischemic infarct. No significant mass effect or
associated hemorrhage.

No other diffusion abnormality to suggest acute or subacute
ischemia. Gray-white matter differentiation otherwise maintained. No
other areas of acute or chronic hemorrhage.

No other mass lesion, midline shift or mass effect. No hydrocephalus
or extra-axial fluid collection. Partially empty sella noted.

Vascular: Major intracranial vascular flow voids are maintained.

Skull and upper cervical spine: Craniocervical junction normal. Bone
marrow signal intensity within normal limits. No scalp soft tissue
abnormality.

Sinuses/Orbits: Globes and orbital soft tissues within normal
limits. Scattered mucosal thickening noted throughout the paranasal
sinuses. No significant mastoid effusion. Inner ear structures
grossly normal.

Other: None.
IMPRESSION: 1. 1.9 cm focus of diffusion signal abnormality involving the medial
right thalamus, favored to reflect an evolving subacute ischemic
infarct. No associated hemorrhage or mass effect. A short interval
follow-up MRI to ensure these changes resolve is recommended,
particularly given the patient history of malignancy, as an
underlying neoplasm could conceivably have this appearance as well.
2. No other acute intracranial abnormality.
3. Moderately advanced cerebral and cerebellar atrophy with chronic
small vessel ischemic disease. Superimposed small remote left
parietal infarct.

## 2020-08-22 IMAGING — CT CT HEAD W/O CM
4 of 6 series · 17 of 47 positions shown, 19 images · non-contrast
Comparison: Prior study from [DATE].

CLINICAL DATA: Initial evaluation for neuro deficit, stroke
suspected, intermittent left upper extremity and facial numbness.

EXAM:
CT HEAD WITHOUT CONTRAST
TECHNIQUE: Contiguous axial images were obtained from the base of the skull
through the vertex without intravenous contrast.

[Series 4: head wo · axial · 0.47mm/px · z∈[+1286,+1420]mm · 7 of 37 slices shown, 9 images]
[im 5/37  brain]
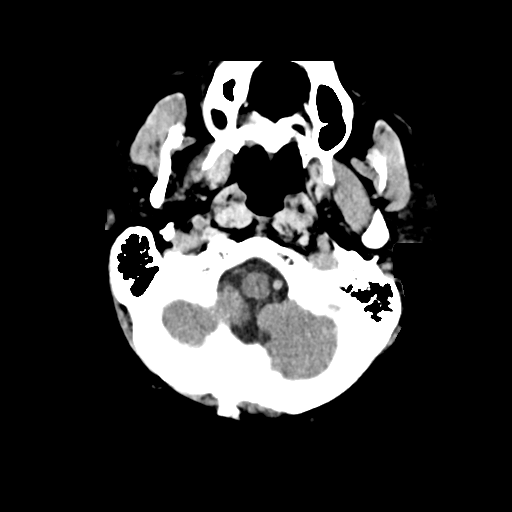
[im 5/37  bone]
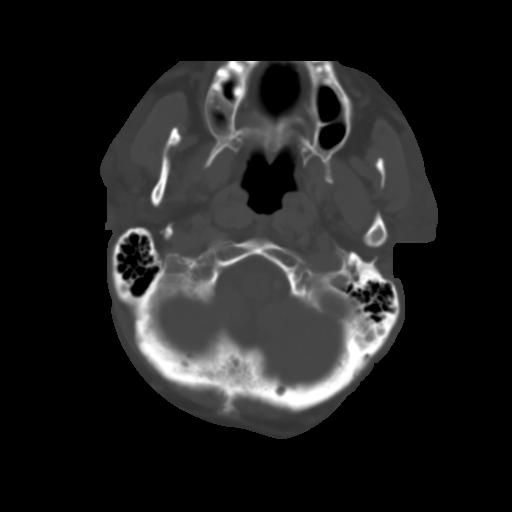
[im 10/37  brain]
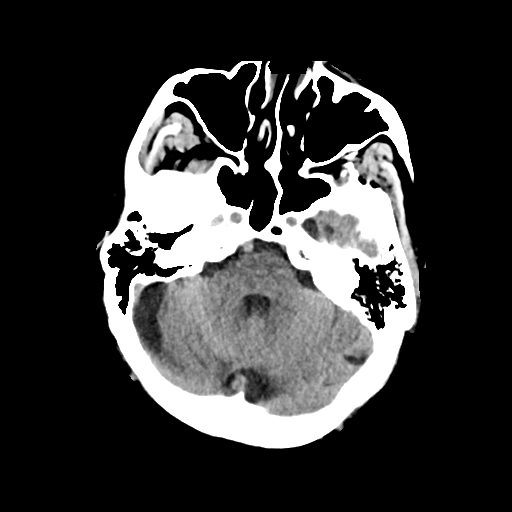
[im 14/37  brain]
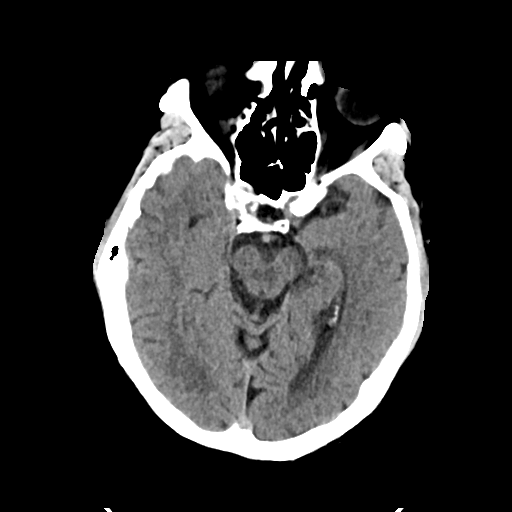
[im 19/37  brain]
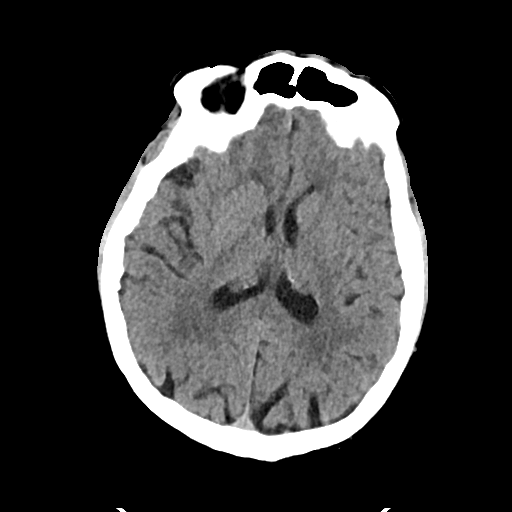
[im 23/37  brain]
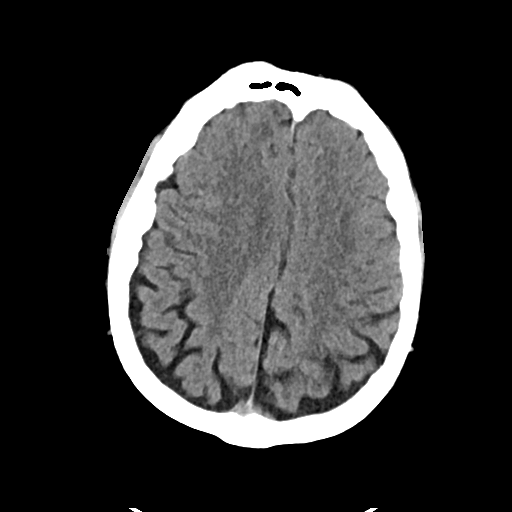
[im 23/37  bone]
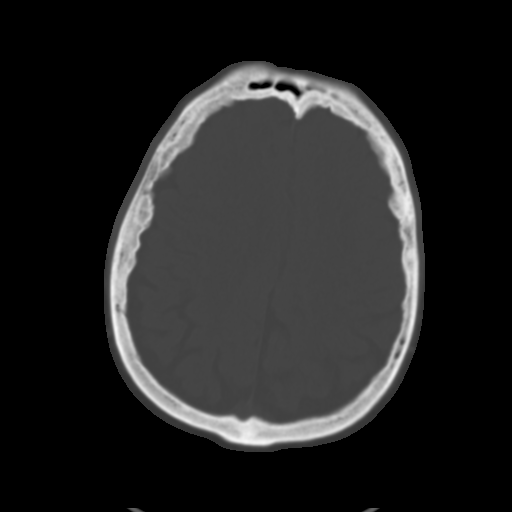
[im 28/37  brain]
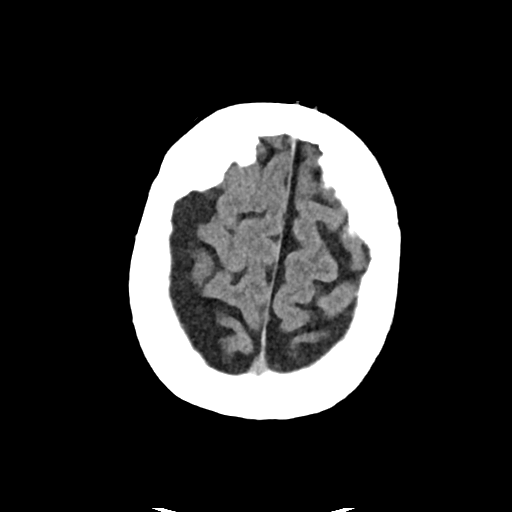
[im 32/37  brain]
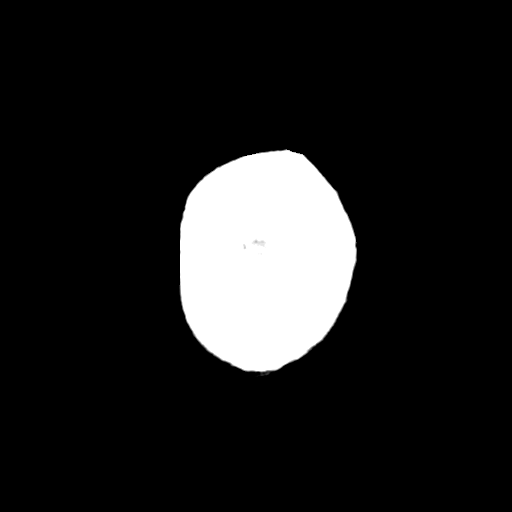

[Series 5: head bone · axial · 0.47mm/px · z∈[+1284,+1392]mm · 6 of 92 slices shown]
[im 10/92  bone]
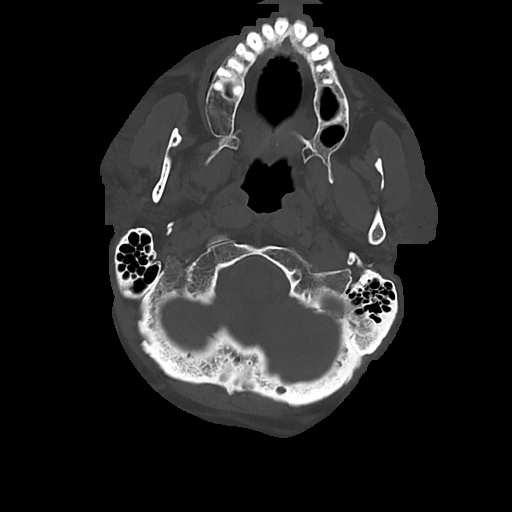
[im 19/92  bone]
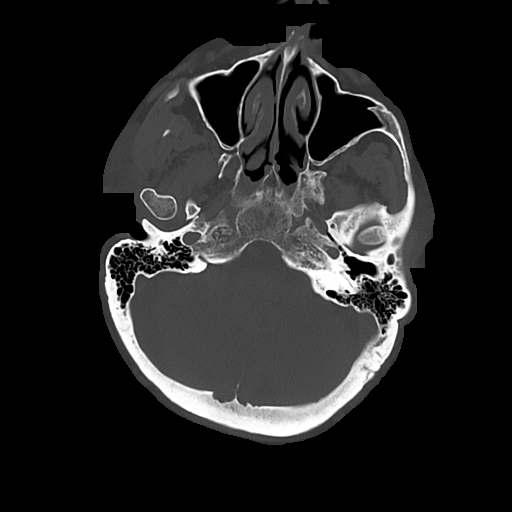
[im 28/92  bone]
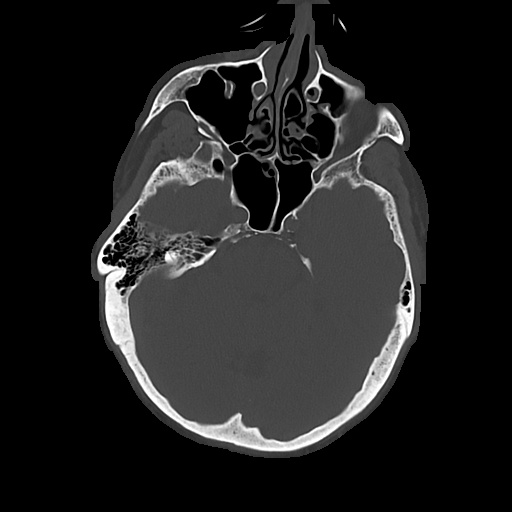
[im 41/92  bone]
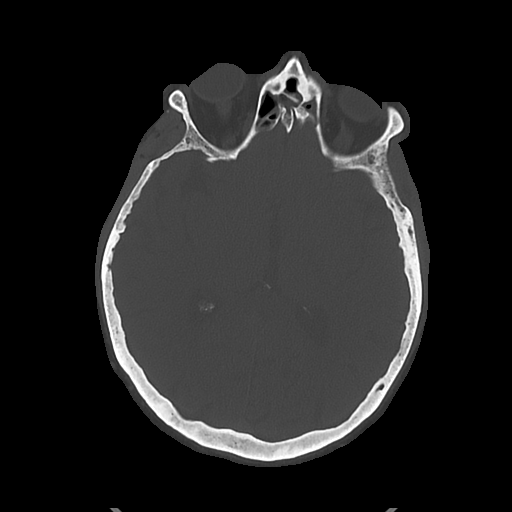
[im 51/92  bone]
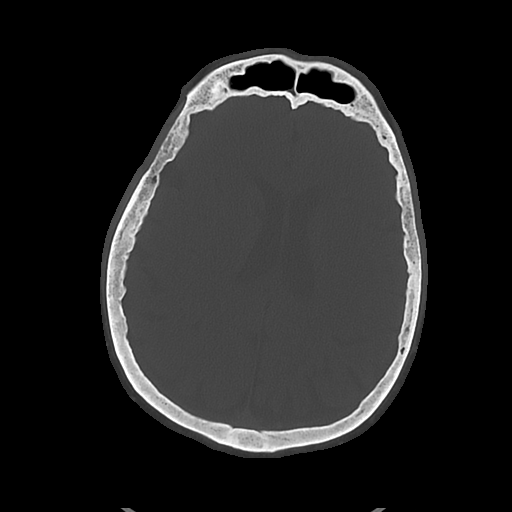
[im 64/92  bone]
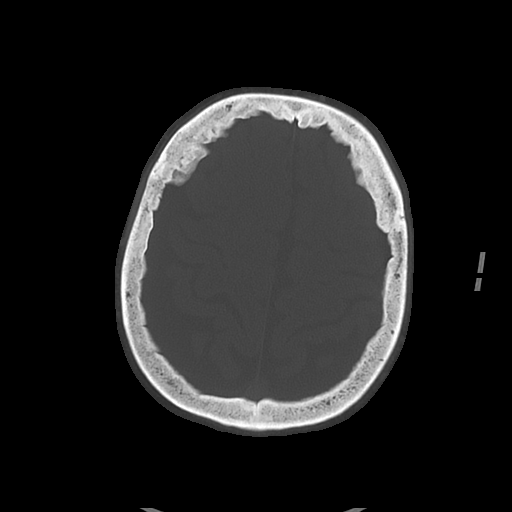

[Series 6: cor soft · coronal · 0.38mm/px · 3 of 76 slices shown]
[im 26/76  brain]
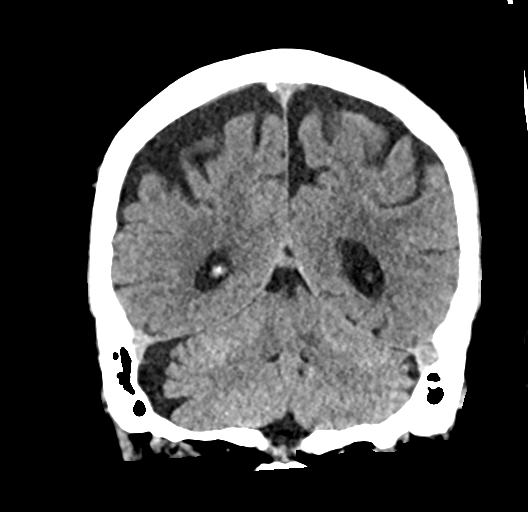
[im 38/76  brain]
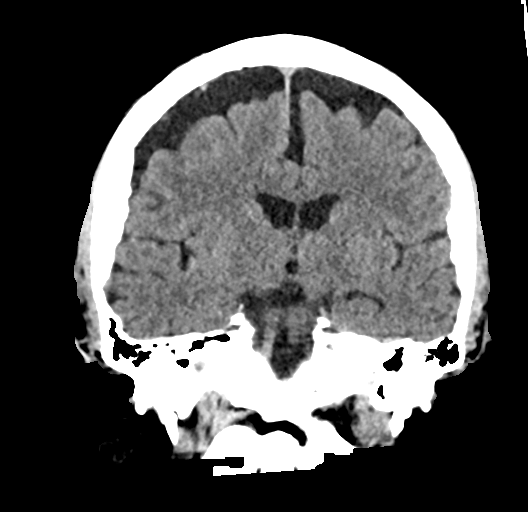
[im 51/76  brain]
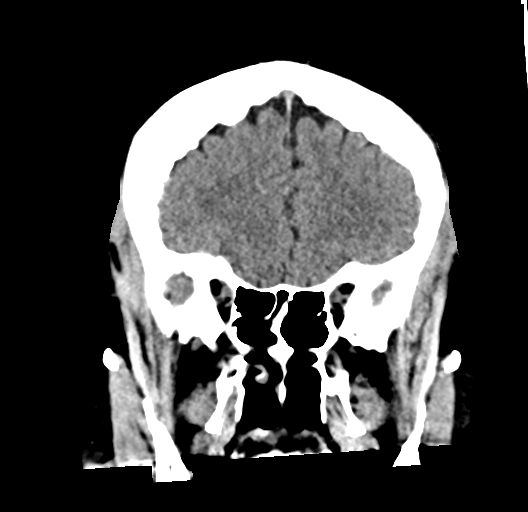

[Series 7: sag soft · sagittal · 0.38mm/px · 1 of 68 slices shown]
[im 34/68  brain]
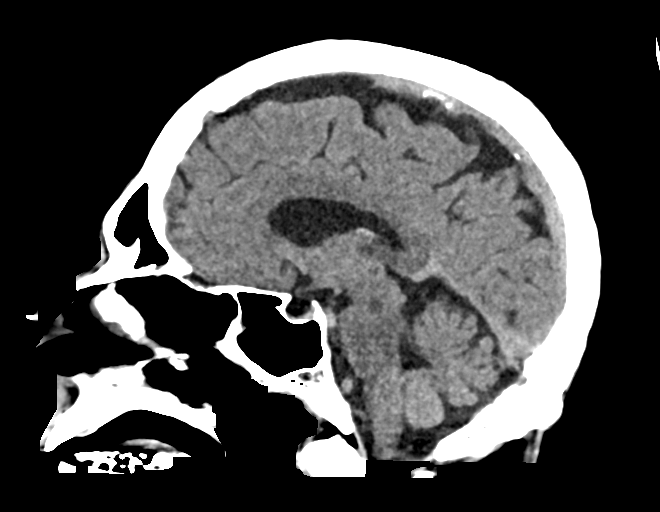

[17 of 47 positions shown; findings below may reference images not displayed]

FINDINGS: Brain: Parietal predominant cerebral atrophy, progressed as compared
to prior exam from [[place]]. Probable mild chronic microvascular
ischemic disease noted involving the periventricular and deep white
matter. Small remote lacunar infarct noted involving the right
caudate body.

No acute intracranial hemorrhage. No visible acute large vessel
territory infarct. No mass lesion, mass effect, or midline shift. No
hydrocephalus or extra-axial fluid collection.

Vascular: No hyperdense vessel. Scattered vascular calcifications
noted within the carotid siphons.

Skull: No scalp soft tissue abnormality.  Calvarium intact.

Sinuses/Orbits: Globes and orbital soft tissues demonstrate no acute
finding. Scattered mucoperiosteal thickening noted throughout the
paranasal sinuses. No air-fluid levels to suggest acute sinusitis.
Mastoid air cells and middle ear cavities are well pneumatized and
free of fluid.

Other: None.
IMPRESSION: 1. No acute intracranial abnormality.
2. Parietal predominant cerebral atrophy with mild chronic small
vessel ischemic disease, progressed as compared to prior exam from
3. Small remote lacunar infarct involving the right caudate.

## 2020-08-22 IMAGING — MR MR CERVICAL SPINE W/O CM
5 series · 35 of 48 positions shown · non-contrast
Comparison: CT from earlier the same day.

CLINICAL DATA: Initial evaluation for left facial and upper
extremity numbness for 4 days.

EXAM:
MRI CERVICAL SPINE WITHOUT CONTRAST
TECHNIQUE: Multiplanar, multisequence MR imaging of the cervical spine was
performed. No intravenous contrast was administered.

[Series 6: T2 · sagittal · 3.0mm · 0.69mm/px · 5 of 15 slices shown (1 of 2)]
[im 1/15]
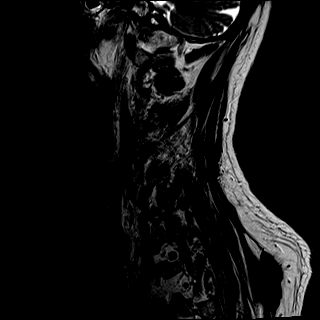
[im 4/15]
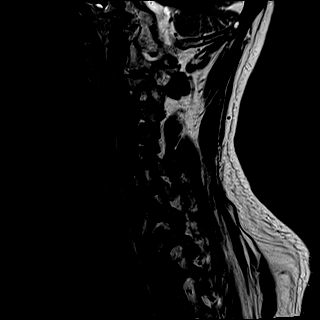
[im 8/15]
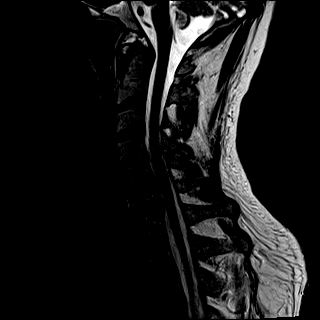
[im 11/15]
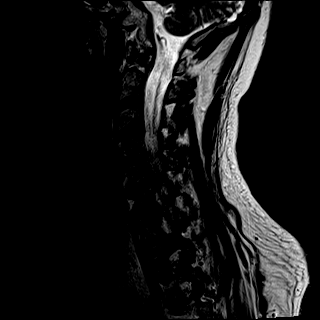
[im 15/15]
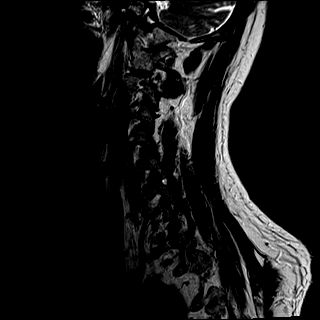

[Series 7: T1 · sagittal · 3.0mm · 0.69mm/px · 5 of 15 slices shown]
[im 1/15]
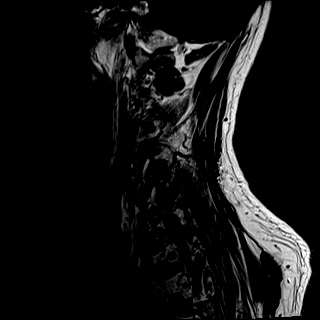
[im 4/15]
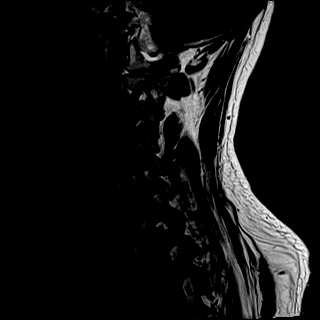
[im 8/15]
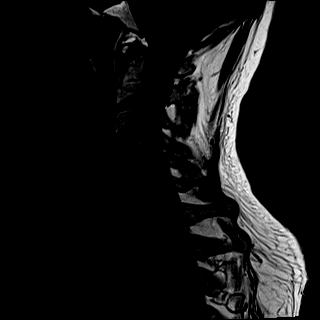
[im 11/15]
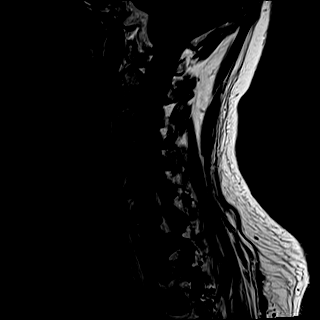
[im 15/15]
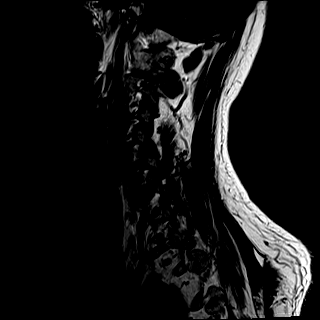

[Series 8: STIR · sagittal · 3.0mm · 0.86mm/px · 6 of 15 slices shown]
[im 1/15]
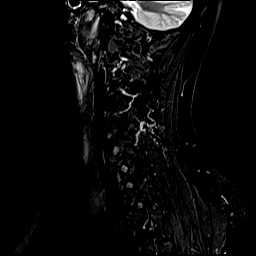
[im 3/15]
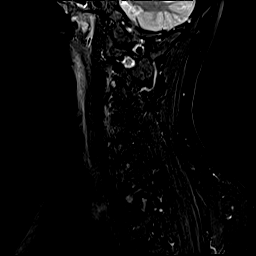
[im 6/15]
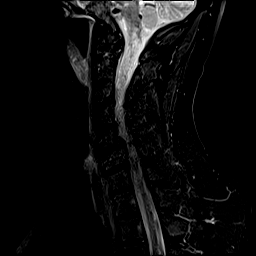
[im 9/15]
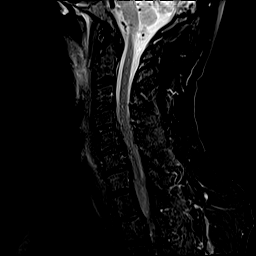
[im 12/15]
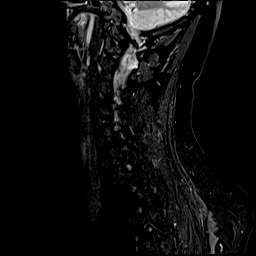
[im 15/15]
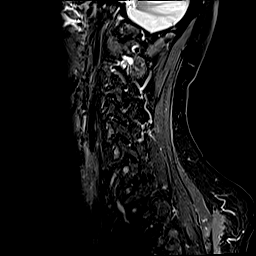

[Series 9: T2 · axial · 3.0mm · 0.66mm/px · z∈[-234,-105]mm · 11 of 42 slices shown (2 of 2)]
[im 1/42]
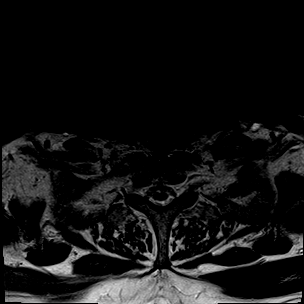
[im 3/42]
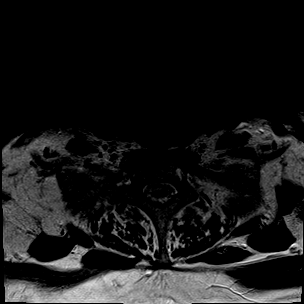
[im 6/42]
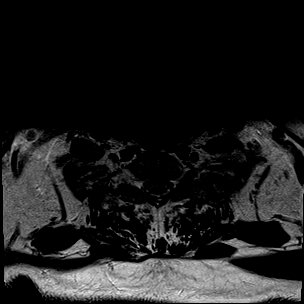
[im 9/42]
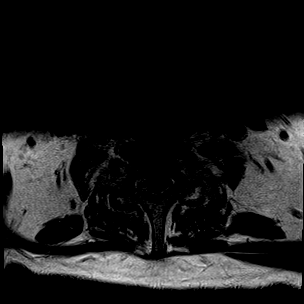
[im 14/42]
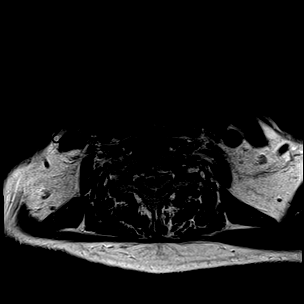
[im 20/42]
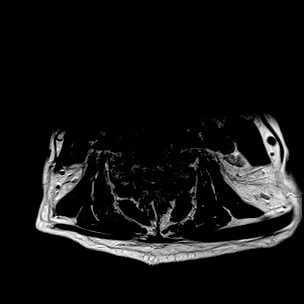
[im 22/42]
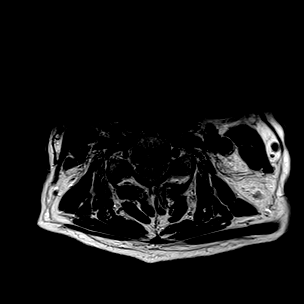
[im 25/42]
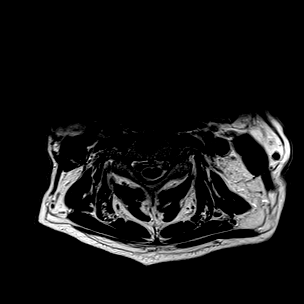
[im 31/42]
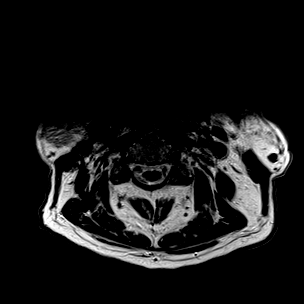
[im 36/42]
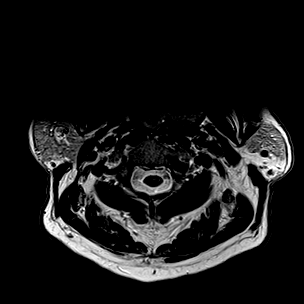
[im 42/42]
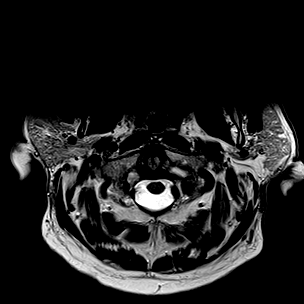

[Series 10: GRE · axial · 3.0mm · 0.39mm/px · z∈[-228,-105]mm · 8 of 42 slices shown]
[im 3/42]
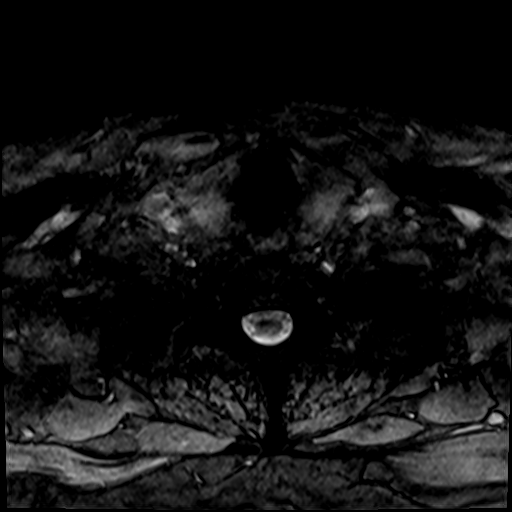
[im 9/42]
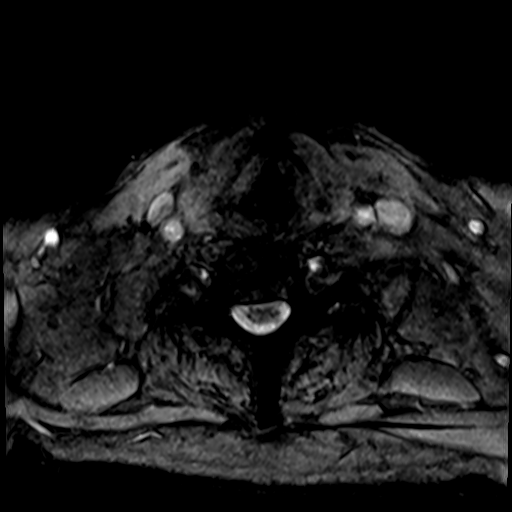
[im 14/42]
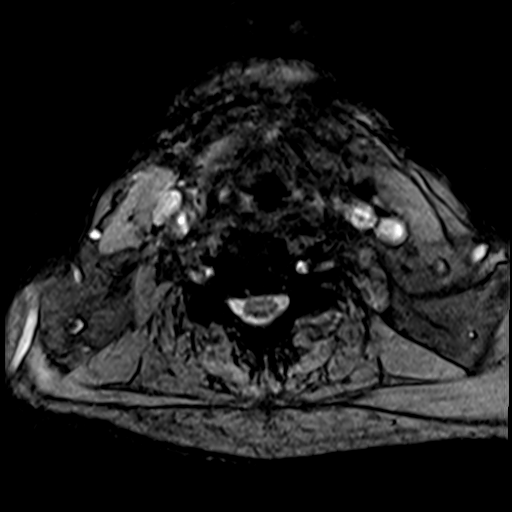
[im 20/42]
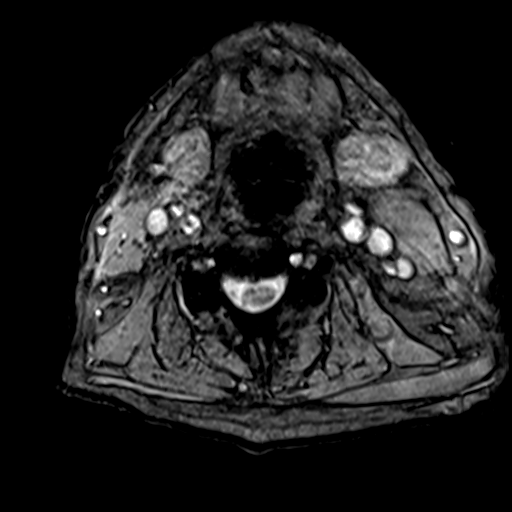
[im 25/42]
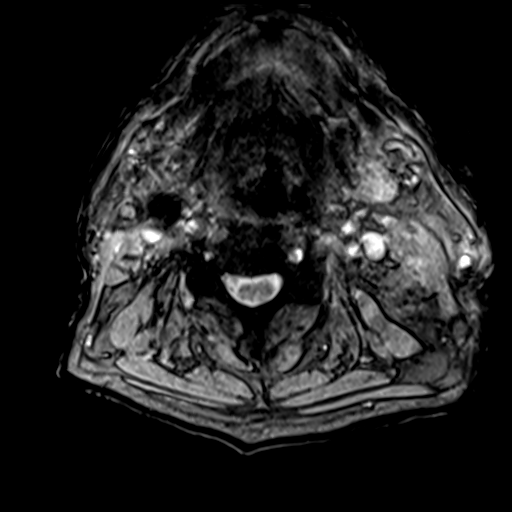
[im 31/42]
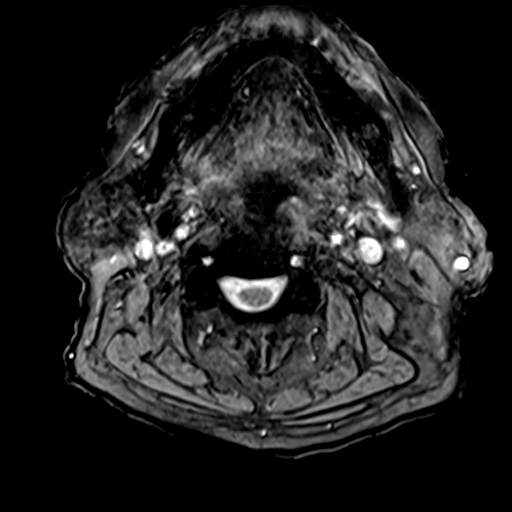
[im 36/42]
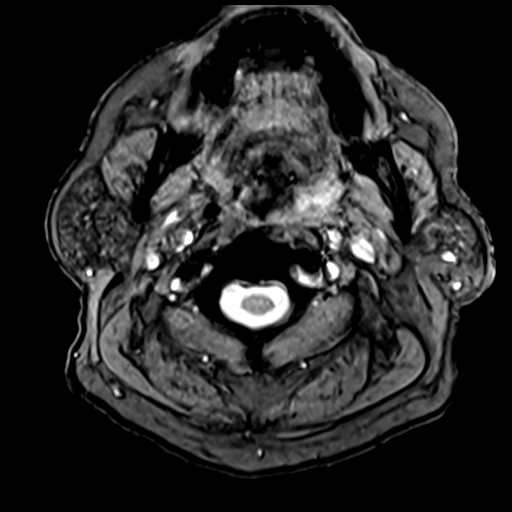
[im 42/42]
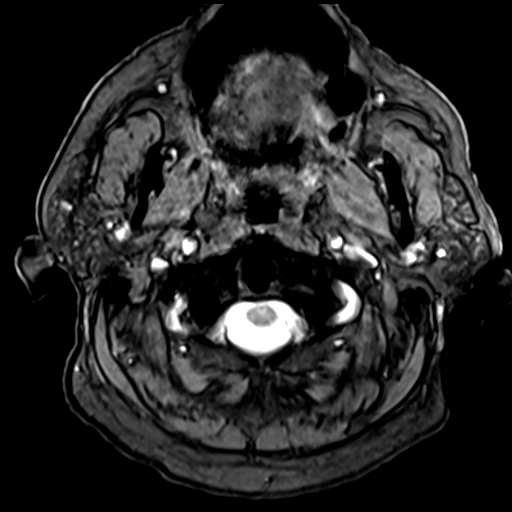

[35 of 48 positions shown; findings below may reference images not displayed]

FINDINGS: Alignment: Straightening of the normal cervical lordosis. Trace
anterolisthesis of C5 on C6.

Vertebrae: Vertebral body height maintained without acute or chronic
fracture. Bone marrow signal intensity within normal limits. No
discrete or worrisome osseous lesions. No abnormal marrow edema.

Cord: Normal signal and morphology.

Posterior Fossa, vertebral arteries, paraspinal tissues:
Craniocervical junction normal. Paraspinous and prevertebral soft
tissues demonstrate no acute finding. Normal flow voids seen within
the carotid and vertebral arteries bilaterally.

Disc levels:

C2-C3: Normal interspace. Mild left greater than right facet
hypertrophy. No canal or foraminal stenosis.

C3-C4: Mild disc bulge with uncovertebral hypertrophy. Superimposed
small central disc protrusion indents the ventral thecal sac (series
10, image 17). No significant spinal stenosis or cord deformity.
Moderate bilateral C4 foraminal stenosis.

C4-C5: Central/right paracentral disc protrusion indents the ventral
thecal sac, contacting and mildly flattening the ventral cord. No
cord signal changes. Mild spinal stenosis. Mild to moderate
bilateral C5 foraminal narrowing.

C5-C6: Trace anterolisthesis. Degenerative intervertebral disc space
narrowing with diffuse disc osteophyte complex, eccentric to the
right. Broad posterior component flattens and effaces the ventral
thecal sac with resultant mild to moderate spinal stenosis. Minimal
cord flattening without cord signal changes. Severe right with
moderate left C6 foraminal narrowing.

C6-C7: Degenerative intervertebral disc space narrowing with diffuse
disc osteophyte complex, slightly eccentric to the right. Posterior
component flattens and partially effaces the ventral thecal sac,
greater on the right. Mild spinal stenosis without frank cord
impingement. Moderate left C7 foraminal narrowing. Right neural
foramina remains patent.

C7-T1: Negative interspace.  Mild facet hypertrophy.  No stenosis.

Visualized upper thoracic spine demonstrates no significant finding.
IMPRESSION: 1. No acute abnormality within the cervical spine.
2. Multilevel cervical spondylosis with resultant mild to moderate
spinal stenosis at C4-5 through C6-7.
3. Multifactorial degenerative changes with resultant multilevel
foraminal narrowing as above. Notable findings include moderate
bilateral C4 foraminal stenosis, severe right with moderate left C6
foraminal narrowing, with moderate left C7 foraminal stenosis.

## 2020-08-22 IMAGING — CT CT CERVICAL SPINE W/O CM
3 series · 14 of 33 positions shown, 17 images · non-contrast
Comparison: Prior CT of the neck from [DATE].

CLINICAL DATA: Initial evaluation for intermittent left upper
extremity and facial numbness for 4 days.

EXAM:
CT CERVICAL SPINE WITHOUT CONTRAST
TECHNIQUE: Multidetector CT imaging of the cervical spine was performed without
intravenous contrast. Multiplanar CT image reconstructions were also
generated.

[Series 9: c spine soft · axial · 0.34mm/px · z∈[+1136,+1310]mm · 6 of 114 slices shown, 8 images]
[im 18/114  soft-tissue]
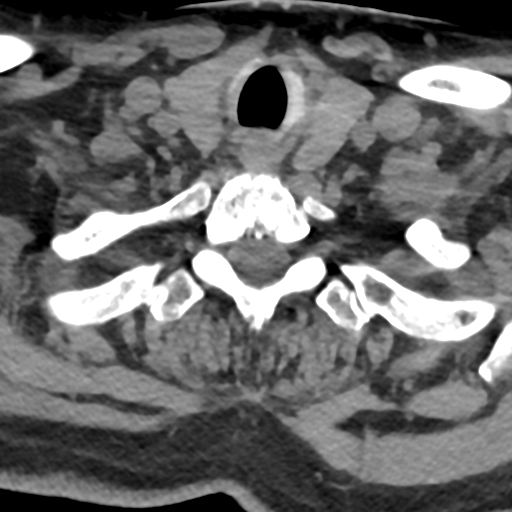
[im 18/114  bone]
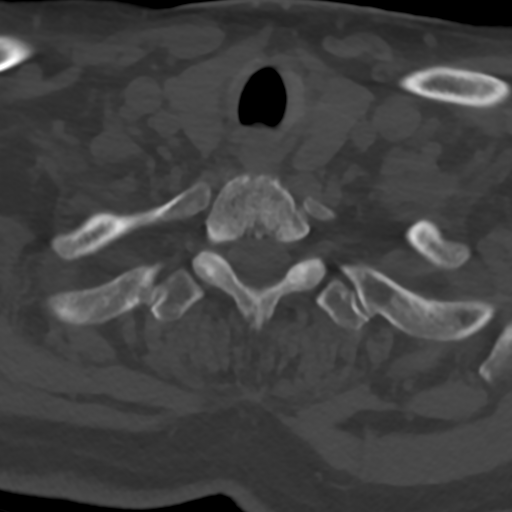
[im 35/114  bone]
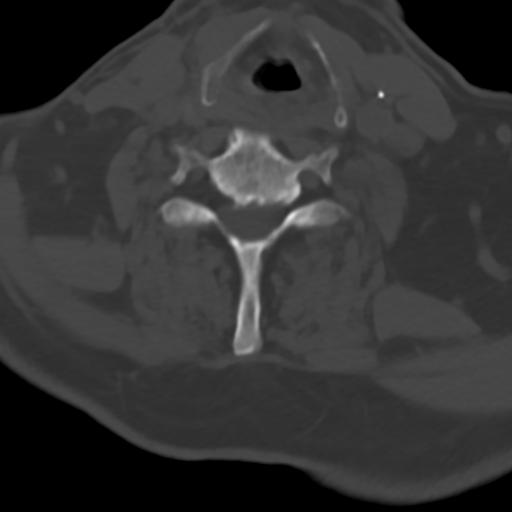
[im 53/114  bone]
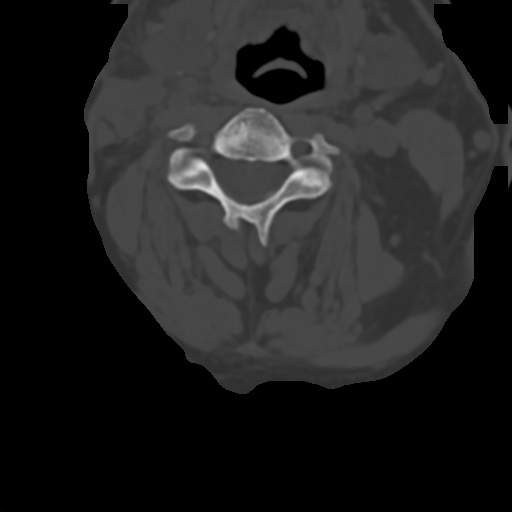
[im 70/114  bone]
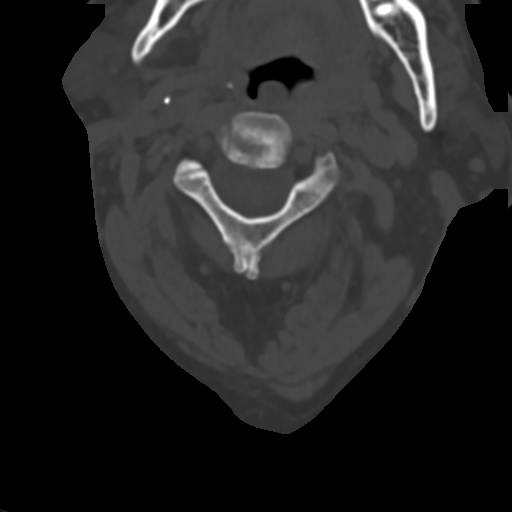
[im 87/114  soft-tissue]
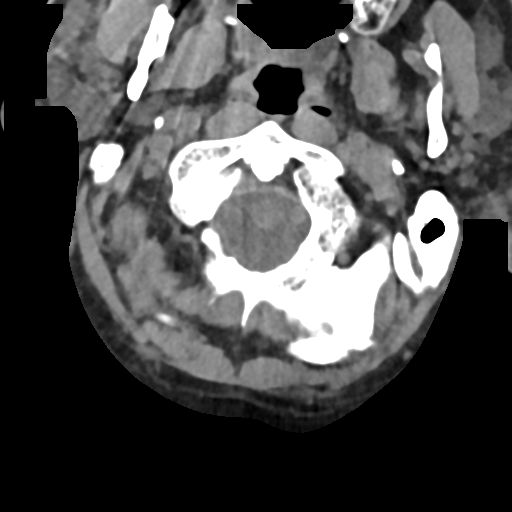
[im 87/114  bone]
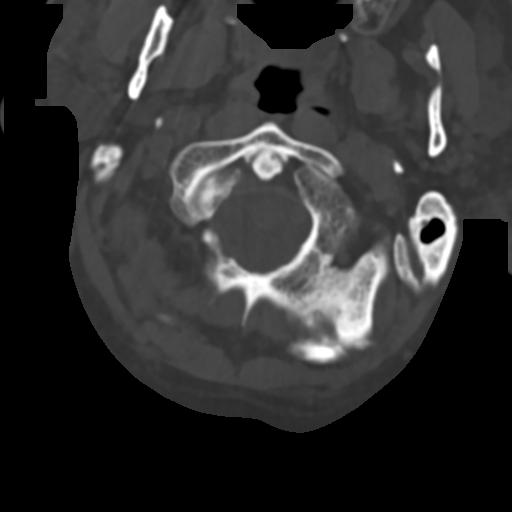
[im 105/114  bone]
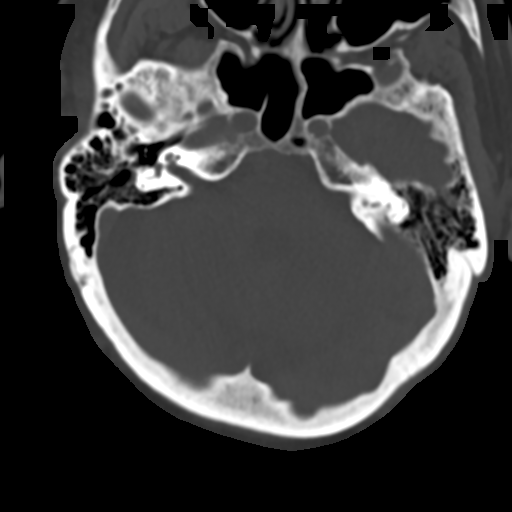

[Series 602: <mpr range> · coronal · 0.45mm/px · 3 of 81 slices shown]
[im 17/81  bone]
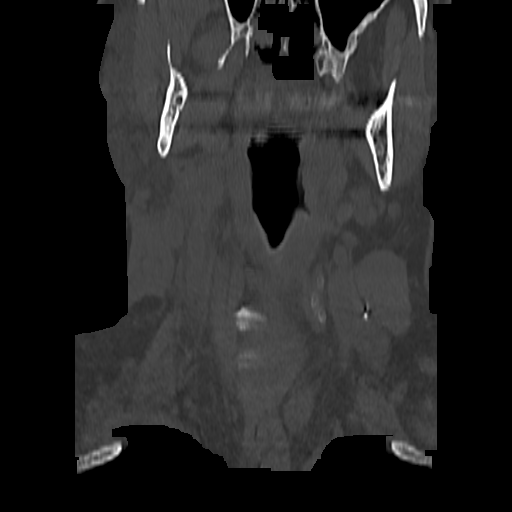
[im 33/81  bone]
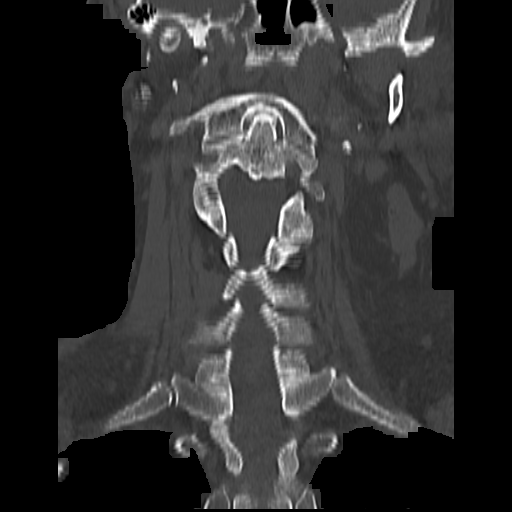
[im 49/81  bone]
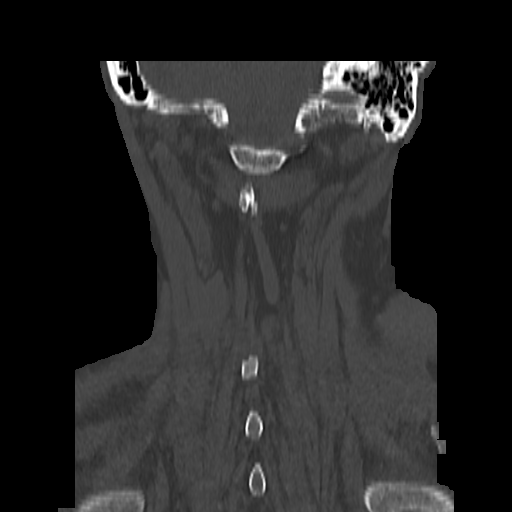

[Series 603: <mpr range(1)> · sagittal · 0.45mm/px · 5 of 46 slices shown, 6 images]
[im 16/46  bone]
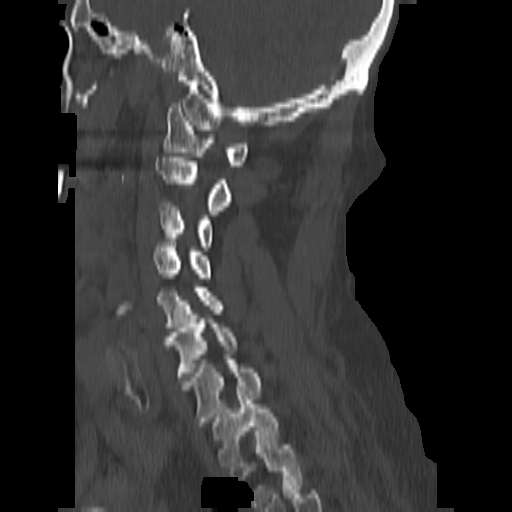
[im 19/46  bone]
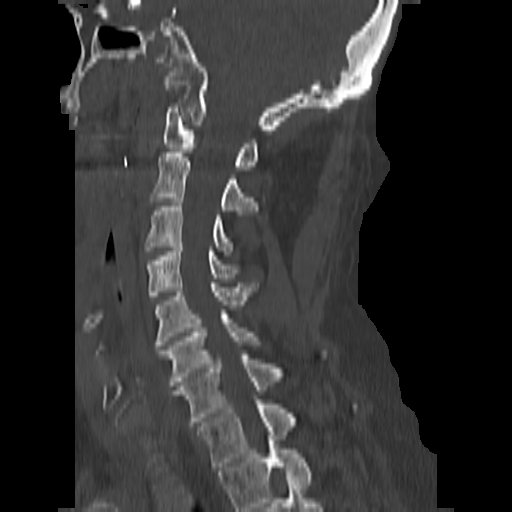
[im 23/46  soft-tissue]
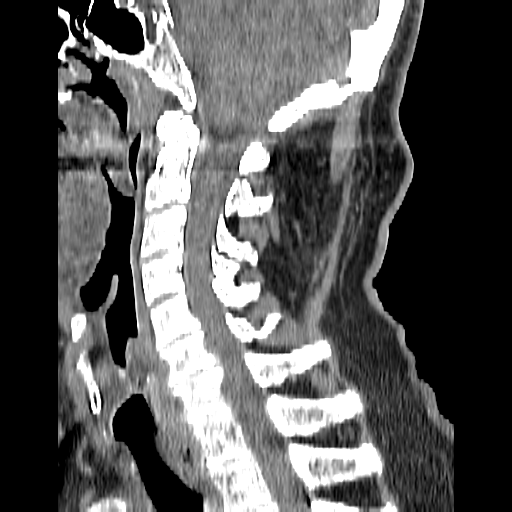
[im 23/46  bone]
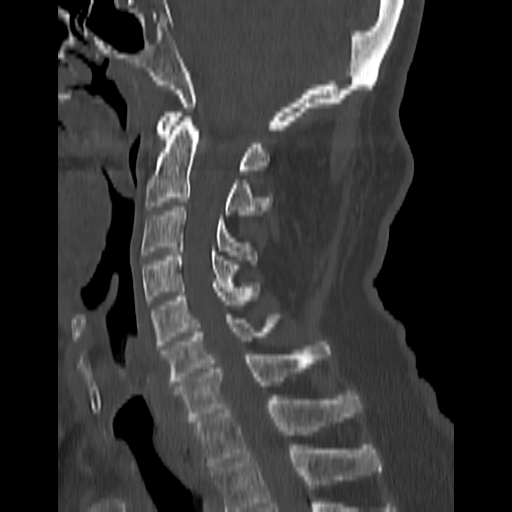
[im 27/46  bone]
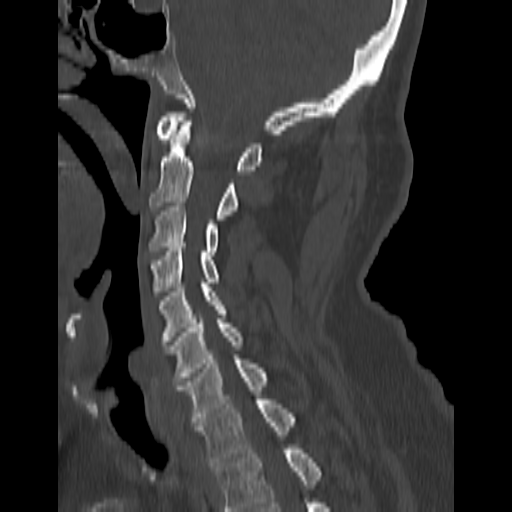
[im 31/46  bone]
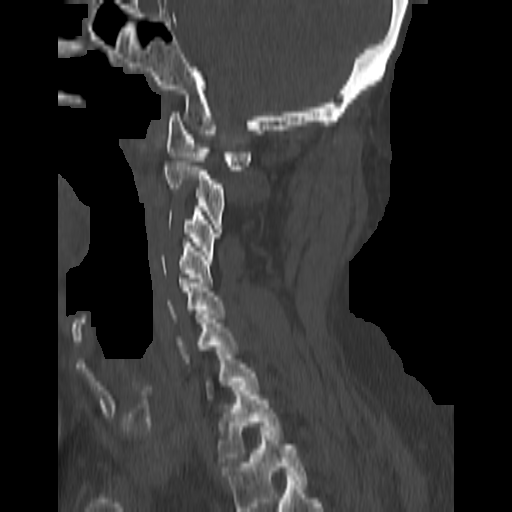

[14 of 33 positions shown; findings below may reference images not displayed]

FINDINGS: Alignment: Please note that this examination was performed during a
downtime and is somewhat technically limited as the scanner abruptly
froze/stopped during image acquisition. Axial images of the cervical
spine in bone and soft tissue window algorithm are provided,
however, the coronal and sagittal reformatted images were
constructed through post processing, and are of markedly low
resolution and fairly limited.

Vertebral bodies normally aligned with preservation of the normal
cervical lordosis. No significant listhesis.

Skull base and vertebrae: Skull base grossly intact. Normal C1-2
articulations are preserved. Dens grossly intact. Vertebral body
height maintained. No visible fracture on this technically limited
exam. No discrete lytic or blastic osseous lesions.

Soft tissues and spinal canal: Postoperative changes from recent
tumor resection with nodal dissection seen within the oropharynx and
neck. Mild hazy stranding within the right greater than left neck
favored to be postoperative in nature. No significant prevertebral
edema. Atherosclerotic calcification noted about the right carotid
bifurcation.

Disc levels:

C2-3: Negative interspace. Left-sided facet hypertrophy. No
stenosis.

C3-4: Disc bulge with bilateral uncovertebral hypertrophy.
Superimposed small central disc protrusion. No significant spinal
stenosis. Mild to moderate right greater than left C4 foraminal
narrowing.

C4-5: Central disc protrusion indents the ventral thecal sac,
contacting the ventral cord (series 9, image 66). Mild spinal
stenosis with minimal cord flattening. Superimposed uncovertebral
and facet hypertrophy with mild to moderate bilateral C5 foraminal
narrowing.

C5-6: Degenerative intervertebral disc space narrowing with diffuse
disc osteophyte complex, eccentric to the right. Flattening of the
ventral thecal sac with resultant mild spinal stenosis. Severe right
with moderate left C6 foraminal narrowing.

C6-7: Degenerative intervertebral disc space narrowing with diffuse
disc osteophyte complex. Posterior component indents and partially
faces the ventral thecal sac with at least mild spinal stenosis.
Moderate left with mild right C7 foraminal narrowing.

C7-T1: Negative interspace. Mild facet hypertrophy. No significant
stenosis.

Upper chest: Visualized upper chest demonstrates no acute finding.
Emphysematous changes noted within the visualized lungs.

Other: None.
IMPRESSION: 1. Limited exam due to technical difficulty during image acquisition
as described above.
2. No definite acute osseous abnormality within the cervical spine.
3. Central disc protrusion at C4-5 with resultant mild spinal
stenosis with minimal cord flattening.
4. Disc osteophyte complexes at C5-6 and C6-7 with resultant mild
spinal stenosis. Severe right with moderate left C6 foraminal
narrowing, with moderate left C7 foraminal stenosis.
5. Postoperative changes from recent tumor resection with nodal
dissection within the oropharynx and neck. No visible adverse
features.
6. Emphysema ([AH]-[AH]).

## 2020-08-22 MED ORDER — ASPIRIN 81 MG PO CHEW
81.0000 mg | CHEWABLE_TABLET | Freq: Once | ORAL | Status: AC
  Administered 2020-08-22: 81 mg via ORAL
  Filled 2020-08-22: qty 1

## 2020-08-22 NOTE — ED Provider Notes (Signed)
Emergency Medicine Provider Triage Evaluation Note  Samuel Serrano , a 70 y.o. male  was evaluated in triage.  Pt complains of LUE paresthesias, decreased sensation. Had onset on Sunday which resolved Monday. Recurrence today at 1430. States sensation is decreased from his forearm to his hand. Also reports some L sided facial numbness. No extremity weakness, facial drooping, neck pain, headache. Not chronically anticoagulated. No hx of TIA/CVA. 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  Review of Systems  Positive: LUE paresthesias Negative: Headache, extremity weakness  Physical Exam  BP (!) 177/98 (BP Location: Right Arm)   Pulse 89   Temp 97.9 F (36.6 C)   Resp 18   Ht '6\' 1"'$  (1.854 m)   Wt 111.6 kg   SpO2 96%   BMI 32.46 kg/m  Gen:   Awake, no distress   Resp:  Normal effort  MSK:   Moves extremities without difficulty  Other:  GCS 15. Speech is goal oriented. No cranial nerve deficits appreciated; symmetric eyebrow raise, no facial drooping, tongue midline. Patient has equal grip strength bilaterally with 5/5 strength against resistance in all major muscle groups bilaterally. Normal shoulder shrug against resistance. Decreased sensation to the dorsal aspect of the L forearm as well as the 3rd-5th digits of the L hand. Sensation to light touch intact and equal in BLE. Gait not assessed.  Medical Decision Making  Medically screening exam initiated at 12:50 AM.  Appropriate orders placed.  Ruffin Pyo was informed that the remainder of the evaluation will be completed by another provider, this initial triage assessment does not replace that evaluation, and the importance of remaining in the ED until their evaluation is complete.  LUE numbness/paresthesias   Antonietta Breach, Hershal Coria 08/22/20 0053    Quintella Reichert, MD 08/22/20 [phone]

## 2020-08-22 NOTE — ED Notes (Addendum)
Was seen at [facility] ER last night had blood work, CT done states left r/t wait time. Left sided numbness started Sunday, intermittent since, state slip numbness, denies any h/o stroke.  States left hand numbness now. , denies any vision changes, denies HA or N/V.  Denies any pain.  States unsteady gait.

## 2020-08-22 NOTE — ED Notes (Signed)
Patient transported to MRI 

## 2020-08-22 NOTE — H&P (Signed)
History and Physical    Berman Gurski [id number] DOB: [birth year] DOA: 08/22/2020  PCP: Nicholes Rough, PA-C   Patient coming from: Home  Chief Complaint: Numbness of left hand and arm, side of head  HPI: Samuel Serrano is a 70 y.o. male with medical history significant for throat cancer s/p surgery, CAD, left carotid stenosis, PVD who presents with numbness of the left upper extremity and hand with some numbness on the left side of his head in the temporal region.  He denies any headache.  He denies any syncope.  Reports the symptoms started 2 to 3 days ago with left upper extremity numbness.  Symptoms spontaneously resolved but then recurred again yesterday.  On the night of August 21, 2020 patient had worsening numbness in the left hand and arm and he came to the emergency room for an evaluation.  After seeing the emergency room for a few hours and being told would be another 12 to 14 hours where he was seen he went home.  At that time a CT scan was obtained before he left and he came back to the emergency room at [facility] with worsening symptoms.  MRI shows a subacute right thalamic stroke.  Was then transferred to [facility] for neurology evaluation.  He reports that the numbness in the left side of his head is improved and is just his left distal fingers that have very mild numbness which is better than it was.  He has not had any slurred speech or drooping face and denies any visual change.  He has not had any head injury or trauma. He does smoke a pack of cigarettes a day.  He drinks 2-3 alcoholic drinks a week.  Denies illicit drug use.  Lives with his wife  ED Course: In the emergency room he was found to have subacute stroke on MRI of the brain.  Hospital service was asked to admit for further management.  Neurology's been consulted and awaiting their evaluation and recommendations.  Lab work was unremarkable.  COVID swab is pending  Review of Systems:  General: Denies  fever, chills,  weight loss, night sweats.  Denies dizziness.  Denies change in appetite HENT: Denies head trauma, headache, denies change in hearing, tinnitus.  Denies nasal congestion or bleeding.  Denies sore throat, sores in mouth.  Denies difficulty swallowing Eyes: Denies blurry vision, pain in eye, drainage.  Denies discoloration of eyes. Neck: Denies pain.  Denies swelling.  Denies pain with movement. Cardiovascular: Denies chest pain, palpitations.  Denies edema.  Denies orthopnea Respiratory: Denies shortness of breath, cough.  Denies wheezing.  Denies sputum production Gastrointestinal: Denies abdominal pain, swelling.  Denies nausea, vomiting, diarrhea.  Denies melena.  Denies hematemesis. Musculoskeletal: Denies limitation of movement.  Denies deformity or swelling.  Denies pain.  Denies arthralgias or myalgias. Genitourinary: Denies pelvic pain.  Denies urinary frequency or hesitancy.  Denies dysuria.  Skin: Denies rash.  Denies petechiae, purpura, ecchymosis. Neurological: Denies syncope.  Denies seizure activity.  Denies weakness.  Denies slurred speech, drooping face.  Denies visual change. Psychiatric: Denies depression, anxiety. Denies hallucinations.  Past Medical History:  Diagnosis Date   Cancer [facility])    Carotid artery occlusion    Peripheral vascular disease (HCC)    left carotid stenosis   Throat cancer [facility])     Past Surgical History:  Procedure Laterality Date   COLONOSCOPY     CYST EXCISION     benign cysts in breasts at age 29  ENDARTERECTOMY Left 07/24/2016   Procedure: Left Carotid ENDARTERECTOMY;  Surgeon: Rosetta Posner, MD;  Location: [facility] OR;  Service: Vascular;  Laterality: Left;   PATCH ANGIOPLASTY Left 07/24/2016   Procedure: PATCH ANGIOPLASTY;  Surgeon: Rosetta Posner, MD;  Location: [facility] OR;  Service: Vascular;  Laterality: Left;   throat cancer surgery     WISDOM TOOTH EXTRACTION      Social History  reports that he has been smoking cigarettes. He has never used  smokeless tobacco. He reports current alcohol use. He reports that he does not use drugs.  Allergies  Allergen Reactions   No Known Allergies     Family History  Problem Relation Age of Onset   Breast cancer Mother    Diabetes Father    Hypertension Father    Colon cancer Neg Hx    Esophageal cancer Neg Hx    Liver cancer Neg Hx    Pancreatic cancer Neg Hx    Rectal cancer Neg Hx    Stomach cancer Neg Hx      Prior to Admission medications   Medication Sig Start Date End Date Taking? Authorizing Provider  buPROPion (WELLBUTRIN SR) 150 MG 12 hr tablet Start one week before quit date. Take 1 tab daily x 3 days, then 1 tab BID thereafter. Patient taking differently: Take 150 mg by mouth 2 (two) times daily. Start one week before quit date. 05/06/20  Yes Eppie Gibson, MD  fluorometholone (FML) 0.1 % ophthalmic suspension Place 1 drop into the left eye 4 (four) times daily. 07/04/20  Yes [provider]  hydrOXYzine (ATARAX/VISTARIL) 25 MG tablet Take 100 mg by mouth at bedtime.   Yes [provider]  naproxen sodium (ALEVE) 220 MG tablet Take 220 mg by mouth daily as needed (pain).   Yes [provider]  neomycin-polymyxin b-dexamethasone (MAXITROL) 3.5-10000-0.1 OINT Place 1 application into the left eye 2 (two) times daily. 07/03/20  Yes [provider]  nicotine (NICODERM CQ - DOSED IN MG/24 HOURS) 14 mg/24hr patch Place 1 patch (14 mg total) onto the skin daily. Use for 2 weeks, then taper to '7mg'$  dose. Patient not taking: No sig reported 05/06/20   Eppie Gibson, MD  nicotine (NICODERM CQ - DOSED IN MG/24 HOURS) 21 mg/24hr patch Place 1 patch (21 mg total) onto the skin daily. Use for 6 weeks, then taper to '14mg'$  dose. Patient not taking: No sig reported 05/06/20   Eppie Gibson, MD  nicotine (NICODERM CQ - DOSED IN MG/24 HR) 7 mg/24hr patch Place 1 patch (7 mg total) onto the skin daily. Use for 2 weeks. Patient not taking: No sig reported 05/06/20    Eppie Gibson, MD    Physical Exam: Vitals:   08/22/20 1240 08/22/20 1514 08/22/20 1942 08/22/20 2031  BP: (!) 192/100 (!) 178/105 (!) 202/94 (!) 173/96  Pulse: 76 79 74   Resp: '20 18 16   '$ Temp: 98 F (36.7 C)     TempSrc: Oral     SpO2: 99% 96% 98%     Constitutional: NAD, calm, comfortable Vitals:   08/22/20 1240 08/22/20 1514 08/22/20 1942 08/22/20 2031  BP: (!) 192/100 (!) 178/105 (!) 202/94 (!) 173/96  Pulse: 76 79 74   Resp: '20 18 16   '$ Temp: 98 F (36.7 C)     TempSrc: Oral     SpO2: 99% 96% 98%    General: WDWN, Alert and oriented x3.  Eyes: EOMI, PERRL, conjunctivae normal.  Sclera nonicteric HENT:  [place]/AT, external ears normal.  Nares patent without epistasis.  Mucous membranes are moist. Posterior pharynx clear   Neck: Soft, normal range of motion, supple, no masses, no thyromegaly. Trachea midline Respiratory: clear to auscultation bilaterally, no wheezing, no crackles. Normal respiratory effort. No accessory muscle use.  Cardiovascular: Regular rate and rhythm, no murmurs / rubs / gallops. No extremity edema. 2+ pedal pulses. No carotid bruits.  Abdomen: Soft, no tenderness, nondistended, no rebound or guarding.  No masses palpated Bowel sounds normoactive Musculoskeletal: FROM. No cyanosis. No joint deformity upper and lower extremities. Normal muscle tone.  Skin: Warm, dry, intact no rashes, lesions, ulcers. No induration Neurologic: CN 2-12 grossly intact.  Normal speech.  Sensation intact to touch, patella DTR +1 bilaterally. Strength 5/5 in all extremities.  No pronator drift. No drooping of face. Tongue with normal extension without deviation.  Psychiatric: Normal judgment and insight.  Normal mood.    Labs on Admission: I have personally reviewed following labs and imaging studies  CBC: Recent Labs  Lab 08/22/20 0038 08/22/20 0059  WBC 6.3  --   NEUTROABS 2.9  --   HGB 13.0 12.6*  HCT 39.2 37.0*  MCV 93.3  --   PLT 191  --     Basic Metabolic  Panel: Recent Labs  Lab 08/22/20 0038 08/22/20 0059  NA 141 142  K 4.0 4.6  CL 107 109  CO2 26  --   GLUCOSE 118* 111*  BUN 17 22  CREATININE 1.09 1.10  CALCIUM 9.1  --     GFR: Estimated Creatinine Clearance: 81.8 mL/min (by C-G formula based on SCr of 1.1 mg/dL).  Liver Function Tests: Recent Labs  Lab 08/22/20 0038  AST 23  ALT 13  ALKPHOS 56  BILITOT 0.7  PROT 6.5  ALBUMIN 3.7    Urine analysis:    Component Value Date/Time   COLORURINE YELLOW 08/22/2020 0320   APPEARANCEUR CLEAR 08/22/2020 0320   LABSPEC 1.025 08/22/2020 0320   PHURINE 5.0 08/22/2020 0320   GLUCOSEU NEGATIVE 08/22/2020 0320   HGBUR NEGATIVE 08/22/2020 0320   BILIRUBINUR NEGATIVE 08/22/2020 0320   KETONESUR NEGATIVE 08/22/2020 0320   PROTEINUR NEGATIVE 08/22/2020 0320   NITRITE NEGATIVE 08/22/2020 0320   LEUKOCYTESUR NEGATIVE 08/22/2020 0320    Radiological Exams on Admission: CT HEAD WO CONTRAST (5MM)  Result Date: 08/22/2020 CLINICAL DATA:  Initial evaluation for neuro deficit, stroke suspected, intermittent left upper extremity and facial numbness. EXAM: CT HEAD WITHOUT CONTRAST TECHNIQUE: Contiguous axial images were obtained from the base of the skull through the vertex without intravenous contrast. COMPARISON:  Prior study from 07/18/2016. FINDINGS: Brain: Parietal predominant cerebral atrophy, progressed as compared to prior exam from 2018. Probable mild chronic microvascular ischemic disease noted involving the periventricular and deep white matter. Small remote lacunar infarct noted involving the right caudate body. No acute intracranial hemorrhage. No visible acute large vessel territory infarct. No mass lesion, mass effect, or midline shift. No hydrocephalus or extra-axial fluid collection. Vascular: No hyperdense vessel. Scattered vascular calcifications noted within the carotid siphons. Skull: No scalp soft tissue abnormality.  Calvarium intact. Sinuses/Orbits: Globes and orbital soft  tissues demonstrate no acute finding. Scattered mucoperiosteal thickening noted throughout the paranasal sinuses. No air-fluid levels to suggest acute sinusitis. Mastoid air cells and middle ear cavities are well pneumatized and free of fluid. Other: None. IMPRESSION: 1. No acute intracranial abnormality. 2. Parietal predominant cerebral atrophy with mild chronic small vessel ischemic disease, progressed as compared to prior exam from  2018. 3. 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 Electronically Signed   By: Jeannine Boga M.D.   On: 08/22/2020 04:35   CT Cervical Spine Wo Contrast  Result Date: 08/22/2020 CLINICAL DATA:  Initial evaluation for intermittent left upper extremity and facial numbness for 4 days. EXAM: CT CERVICAL SPINE WITHOUT CONTRAST TECHNIQUE: Multidetector CT imaging of the cervical spine was performed without intravenous contrast. Multiplanar CT image reconstructions were also generated. COMPARISON:  Prior CT of the neck from 04/22/2020. FINDINGS: Alignment: Please note that this examination was performed during a downtime and is somewhat technically limited as the scanner abruptly froze/stopped during image acquisition. Axial images of the cervical spine in bone and soft tissue window algorithm are provided, however, the coronal and sagittal reformatted images were constructed through post processing, and are of markedly low resolution and fairly limited. Vertebral bodies normally aligned with preservation of the normal cervical lordosis. No significant listhesis. Skull base and vertebrae: Skull base grossly intact. Normal C1-2 articulations are preserved. Dens grossly intact. Vertebral body height maintained. No visible fracture on this technically limited exam. No discrete lytic or blastic osseous lesions. Soft tissues and spinal canal: Postoperative changes from recent tumor resection with nodal dissection seen within the oropharynx and neck. Mild hazy stranding  within the right greater than left neck favored to be postoperative in nature. No significant prevertebral edema. Atherosclerotic calcification noted about the right carotid bifurcation. Disc levels: C2-3: Negative interspace. Left-sided facet hypertrophy. No stenosis. C3-4: Disc bulge with bilateral uncovertebral hypertrophy. Superimposed small central disc protrusion. No significant spinal stenosis. Mild to moderate right greater than left C4 foraminal narrowing. C4-5: Central disc protrusion indents the ventral thecal sac, contacting the ventral cord (series 9, image 66). Mild spinal stenosis with minimal cord flattening. Superimposed uncovertebral and facet hypertrophy with mild to moderate bilateral C5 foraminal narrowing. C5-6: Degenerative intervertebral disc space narrowing with diffuse disc osteophyte complex, eccentric to the right. Flattening of the ventral thecal sac with resultant mild spinal stenosis. Severe right with moderate left C6 foraminal narrowing. C6-7: Degenerative intervertebral disc space narrowing with diffuse disc osteophyte complex. Posterior component indents and partially faces the ventral thecal sac with at least mild spinal stenosis. Moderate left with mild right C7 foraminal narrowing. C7-T1: Negative interspace. Mild facet hypertrophy. No significant stenosis. Upper chest: Visualized upper chest demonstrates no acute finding. Emphysematous changes noted within the visualized lungs. Other: None. IMPRESSION: 1. Limited exam due to technical difficulty during image acquisition as described above. 2. No definite acute osseous abnormality within the cervical spine. 3. Central disc protrusion at C4-5 with resultant mild spinal stenosis with minimal cord flattening. 4. Disc osteophyte complexes at C5-6 and C6-7 with resultant mild spinal stenosis. Severe right with moderate left C6 foraminal narrowing, with moderate left C7 foraminal stenosis. 5. Postoperative changes from recent tumor  resection with nodal dissection within the oropharynx and neck. No visible adverse features. 6. Emphysema (ICD10-J43.9). Electronically Signed   By: Jeannine Boga M.D.   On: 08/22/2020 05:01   MR BRAIN WO CONTRAST  Result Date: 08/22/2020 CLINICAL DATA:  Initial evaluation for left facial and upper extremity numbness for 4 days. EXAM: MRI HEAD WITHOUT CONTRAST TECHNIQUE: Multiplanar, multiecho pulse sequences of the brain and surrounding structures were obtained without intravenous contrast. COMPARISON:  CT from earlier the same day. FINDINGS: Brain: Diffuse prominence of the CSF containing spaces compatible with moderately advanced cerebral and cerebellar atrophy, more pronounced on the right. Patchy T2/FLAIR hyperintensity within the periventricular deep  white matter both cerebral hemispheres most consistent with chronic small vessel ischemic disease, moderately advanced in nature. Small area of encephalomalacia and gliosis involving the cortical and subcortical left parietal lobe consistent with a chronic ischemic infarct (series 11, image 19). Associated chronic hemosiderin staining present within this region. There is a well demarcated somewhat elongated focus of diffusion signal abnormality involving medial right thalamus measuring 1.9 cm (series 5, image 78). Associated T2/FLAIR signal hyperintensity without discernible ADC correlate. Finding is favored to reflect an evolving subacute ischemic infarct. No significant mass effect or associated hemorrhage. No other diffusion abnormality to suggest acute or subacute ischemia. Gray-white matter differentiation otherwise maintained. No other areas of acute or chronic hemorrhage. No other mass lesion, midline shift or mass effect. No hydrocephalus or extra-axial fluid collection. Partially empty sella noted. Vascular: Major intracranial vascular flow voids are maintained. Skull and upper cervical spine: Craniocervical junction normal. Bone marrow signal  intensity within normal limits. No scalp soft tissue abnormality. Sinuses/Orbits: Globes and orbital soft tissues within normal limits. Scattered mucosal thickening noted throughout the paranasal sinuses. No significant mastoid effusion. Inner ear structures grossly normal. Other: None. IMPRESSION: 1. 1.9 cm focus of diffusion signal abnormality involving the medial right thalamus, favored to reflect an evolving subacute ischemic infarct. No associated hemorrhage or mass effect. A short interval follow-up MRI to ensure these changes resolve is recommended, particularly given the patient history of malignancy, as an underlying neoplasm could conceivably have this appearance as well. 2. No other acute intracranial abnormality. 3. Moderately advanced cerebral and cerebellar atrophy with chronic small vessel ischemic disease. Superimposed small remote left parietal infarct. Electronically Signed   By: Jeannine Boga M.D.   On: 08/22/2020 19:52   MR Cervical Spine Wo Contrast  Result Date: 08/22/2020 CLINICAL DATA:  Initial evaluation for left facial and upper extremity numbness for 4 days. EXAM: MRI CERVICAL SPINE WITHOUT CONTRAST TECHNIQUE: Multiplanar, multisequence MR imaging of the cervical spine was performed. No intravenous contrast was administered. COMPARISON:  CT from earlier the same day. FINDINGS: Alignment: Straightening of the normal cervical lordosis. Trace anterolisthesis of C5 on C6. Vertebrae: Vertebral body height maintained without acute or chronic fracture. Bone marrow signal intensity within normal limits. No discrete or worrisome osseous lesions. No abnormal marrow edema. Cord: Normal signal and morphology. Posterior Fossa, vertebral arteries, paraspinal tissues: Craniocervical junction normal. Paraspinous and prevertebral soft tissues demonstrate no acute finding. Normal flow voids seen within the carotid and vertebral arteries bilaterally. Disc levels: C2-C3: Normal interspace. Mild  left greater than right facet hypertrophy. No canal or foraminal stenosis. C3-C4: Mild disc bulge with uncovertebral hypertrophy. Superimposed small central disc protrusion indents the ventral thecal sac (series 10, image 17). No significant spinal stenosis or cord deformity. Moderate bilateral C4 foraminal stenosis. C4-C5: Central/right paracentral disc protrusion indents the ventral thecal sac, contacting and mildly flattening the ventral cord. No cord signal changes. Mild spinal stenosis. Mild to moderate bilateral C5 foraminal narrowing. C5-C6: Trace anterolisthesis. Degenerative intervertebral disc space narrowing with diffuse disc osteophyte complex, eccentric to the right. Broad posterior component flattens and effaces the ventral thecal sac with resultant mild to moderate spinal stenosis. Minimal cord flattening without cord signal changes. Severe right with moderate left C6 foraminal narrowing. C6-C7: Degenerative intervertebral disc space narrowing with diffuse disc osteophyte complex, slightly eccentric to the right. Posterior component flattens and partially effaces the ventral thecal sac, greater on the right. Mild spinal stenosis without frank cord impingement. Moderate left C7 foraminal narrowing. Right neural foramina  remains patent. C7-T1: Negative interspace.  Mild facet hypertrophy.  No stenosis. Visualized upper thoracic spine demonstrates no significant finding. IMPRESSION: 1. No acute abnormality within the cervical spine. 2. 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 3. Multifactorial degenerative changes with resultant multilevel foraminal narrowing as above. Notable findings include moderate bilateral C4 foraminal stenosis, severe right with moderate left C6 foraminal narrowing, with moderate left C7 foraminal stenosis. Electronically Signed   By: Jeannine Boga M.D.   On: 08/22/2020 20:03    EKG: Independently reviewed.  EKG  shows normal sinus rhythm with left posterior fascicular block.  QTC prolonged at 486  Assessment/Plan Principal Problem:   Acute CVA (cerebrovascular accident)  Mr. Bagnato admitted on medical telemetry floor for CVA.  MRI of brain shows new CVA.  Pain CT angiography of head and neck to rule out large vessel occlusion Obtain echocardiogram to evaluate for PFO, wall motion and ejection fraction. Hypertension of 220/110 will be allowed for 24 hours per stroke protocol.  After which blood pressure will be slowly reduced to goal level. Antiplatelet therapy with aspirin and Plavix daily. Check lipid panel.  Start Lipitor in setting of new CVA Neurochecks per stroke protocol Neurology's been consulted and awaiting their recommendations  Active Problems:   Prolonged QT interval Avoid medications which could further prolong QT interval.  Monitor on telemetry    DDD (degenerative disc disease), cervical Will follow with PCP.      Tobacco use Nicotine patch provided to prevent nicotine withdrawal while in the hospital    DVT prophylaxis: SCD's for  DVT prophylaxis in setting of acute CVA Code Status:   Full code  Family Communication:  Diagnosis plan discussed with patient his wife was at the bedside.  They both verbalized understanding agree with plan.  The recommendations to follow as clinical indicated Disposition Plan:   Patient is from:  Home  Anticipated DC to:  Home  Anticipated DC date:  Anticipate 2 midnight or more stay  Anticipated DC barriers: No barriers to discharge [place] this time  Consults called:  Neurology  Admission status:  Inpatient   Yevonne Aline Danyka Merlin MD [facility]  How to contact the [facility] Attending or Consulting provider [place] or covering provider during after hours [place], for this patient?   Check the care team in [facility] and look for a) attending/consulting [facility] provider listed and b) the [facility] team listed Log into [email or web address] and use [place]  universal password to access. If you do not have the password, please contact the hospital operator. Locate the [facility] provider you are looking for under [facility] and page to a number that you can be directly reached. If you still have difficulty reaching the provider, please page the [facility] (Director on Call) for the Hospitalists listed on amion for assistance.  08/22/2020, 11:49 PM

## 2020-08-22 NOTE — ED Triage Notes (Signed)
Pt c/o intermittent numbness to left side of head, left UE sx started 8/7-pt was seen at [facility] ED yesterday-LWBS-states he did have blood tests and CT scan-NAD-to triage in w/c

## 2020-08-22 NOTE — ED Provider Notes (Signed)
[facility] EMERGENCY DEPARTMENT Provider Note   CSN: [id number] Arrival date & time: 08/22/20  1229     History Chief Complaint  Patient presents with   Numbness    Samuel Serrano is a 70 y.o. male.  HPI  71 year old male with a past medical history of throat cancer status post surgery, CAD, left carotid stenosis, hypertension presenting to the emergency department with left upper extremity numbness.  Patient reports that 4 days ago, he developed the gradual onset of left upper extremity numbness.  He states that it was more severe in his left hand, but also included his left arm and left face as well.  He denies any weakness, speech difficulties, or word finding difficulty at that time.  He states that his symptoms completely resolved 2 days ago, but then they recurred last night.  He presented to the emergency department at that time and had a CT done, but he left without being seen due to prolonged wait times.  He states that he came back because his symptoms were still present.  He was seen at an outside ED and told that he had previous strokes, but then was transferred here to have MRI imaging done.  He states that currently, he has numbness in his left hand but the remainder of his sensory changes have resolved.  He denies any vision problems.  He denies any blood thinner use.  He denies any recent head trauma.  He denies any neck pain, denies any fever.  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   Carotid stenosis 07/24/2016    Past Surgical History:  Procedure Laterality Date   COLONOSCOPY     CYST EXCISION     benign cysts in breasts at age 60   ENDARTERECTOMY Left 07/24/2016   Procedure: Left Carotid ENDARTERECTOMY;   Surgeon: Rosetta Posner, MD;  Location: [facility] OR;  Service: Vascular;  Laterality: Left;   PATCH ANGIOPLASTY Left 07/24/2016   Procedure: PATCH ANGIOPLASTY;  Surgeon: Rosetta Posner, MD;  Location: [facility] OR;  Service: Vascular;  Laterality: Left;   throat cancer surgery     WISDOM TOOTH EXTRACTION         Family History  Problem Relation Age of Onset   Breast cancer Mother    Diabetes Father    Hypertension Father    Colon cancer Neg Hx    Esophageal cancer Neg Hx    Liver cancer Neg Hx    Pancreatic cancer Neg Hx    Rectal cancer Neg Hx    Stomach cancer Neg Hx     Social History   Tobacco Use   Smoking status: Every Day    Packs/day: 0.00    Types: Cigarettes   Smokeless tobacco: Never   Tobacco comments:    1 1/4 pk per day  Vaping Use   Vaping Use: Never used  Substance Use Topics   Alcohol use: Yes    Comment: occasional   Drug use: No    Home Medications Prior to Admission medications   Medication Sig Start Date End Date Taking? Authorizing Provider  buPROPion (WELLBUTRIN SR) 150 MG 12 hr tablet Start one week before quit date. Take 1 tab  daily x 3 days, then 1 tab BID thereafter. Patient taking differently: Take 150 mg by mouth 2 (two) times daily. Start one week before quit date. 05/06/20  Yes Eppie Gibson, MD  fluorometholone (FML) 0.1 % ophthalmic suspension Place 1 drop into the left eye 4 (four) times daily. 07/04/20  Yes [provider]  hydrOXYzine (ATARAX/VISTARIL) 25 MG tablet Take 100 mg by mouth at bedtime.   Yes [provider]  naproxen sodium (ALEVE) 220 MG tablet Take 220 mg by mouth daily as needed (pain).   Yes [provider]  neomycin-polymyxin b-dexamethasone (MAXITROL) 3.5-10000-0.1 OINT Place 1 application into the left eye 2 (two) times daily. 07/03/20  Yes [provider]  nicotine (NICODERM CQ - DOSED IN MG/24 HOURS) 14 mg/24hr patch Place 1 patch (14 mg total) onto the skin daily. Use for 2 weeks, then taper to  '7mg'$  dose. Patient not taking: No sig reported 05/06/20   Eppie Gibson, MD  nicotine (NICODERM CQ - DOSED IN MG/24 HOURS) 21 mg/24hr patch Place 1 patch (21 mg total) onto the skin daily. Use for 6 weeks, then taper to '14mg'$  dose. Patient not taking: No sig reported 05/06/20   Eppie Gibson, MD  nicotine (NICODERM CQ - DOSED IN MG/24 HR) 7 mg/24hr patch Place 1 patch (7 mg total) onto the skin daily. Use for 2 weeks. Patient not taking: No sig reported 05/06/20   Eppie Gibson, MD    Allergies    No known allergies  Review of Systems   Review of Systems  Constitutional:  Negative for chills and fever.  HENT:  Negative for ear pain and sore throat.   Eyes:  Negative for pain and visual disturbance.  Respiratory:  Negative for cough and shortness of breath.   Cardiovascular:  Negative for chest pain and palpitations.  Gastrointestinal:  Negative for abdominal pain and vomiting.  Genitourinary:  Negative for dysuria and hematuria.  Musculoskeletal:  Negative for arthralgias and back pain.  Skin:  Negative for color change and rash.  Neurological:  Positive for numbness. Negative for dizziness, tremors, seizures, syncope, facial asymmetry, speech difficulty, weakness, light-headedness and headaches.  All other systems reviewed and are negative.  Physical Exam Updated Vital Signs BP (!) 173/96   Pulse 74   Temp 98 F (36.7 C) (Oral)   Resp 16   SpO2 98%   Physical Exam Vitals and nursing note reviewed.  Constitutional:      Appearance: Normal appearance. He is well-developed and normal weight.  HENT:     Head: Normocephalic and atraumatic.  Eyes:     Conjunctiva/sclera: Conjunctivae normal.  Cardiovascular:     Rate and Rhythm: Normal rate and regular rhythm.     Heart sounds: No murmur heard. Pulmonary:     Effort: Pulmonary effort is normal. No respiratory distress.     Breath sounds: Normal breath sounds.  Abdominal:     Palpations: Abdomen is soft.     Tenderness: There  is no abdominal tenderness.  Musculoskeletal:     Cervical back: Neck supple.  Skin:    General: Skin is warm and dry.     Capillary Refill: Capillary refill takes less than 2 seconds.  Neurological:     Mental Status: He is alert.     Comments: Cranial nerves II through XII are grossly intact.  No sensation changes or asymmetry to the face.  Patient has subjectively decreased sensation in the left upper extremity distal to the mid forearm to fine  touch.  Equal motor strength in the large flexors and extensors of the large muscle groups of the upper and lower extremities bilaterally.  Normal heel-to-shin, no past-pointing on exam.   ED Results / Procedures / Treatments   Labs (all labs ordered are listed, but only abnormal results are displayed) Labs Reviewed  RESP PANEL BY RT-PCR (FLU A&B, COVID) [id number]    EKG EKG Interpretation  Date/Time:  Thursday August 22 2020 12:46:47 EDT Ventricular Rate:  74 PR Interval:  168 QRS Duration: 100 QT Interval:  438 QTC Calculation: 486 R Axis:   111 Text Interpretation: Normal sinus rhythm Left posterior fascicular block Cannot rule out Anterior infarct , age undetermined Abnormal ECG Confirmed by Lennice Sites [phone]) on 08/22/2020 3:46:26 PM  Radiology CT HEAD WO CONTRAST (5MM)  Result Date: 08/22/2020 CLINICAL DATA:  Initial evaluation for neuro deficit, stroke suspected, intermittent left upper extremity and facial numbness. EXAM: CT HEAD WITHOUT CONTRAST TECHNIQUE: Contiguous axial images were obtained from the base of the skull through the vertex without intravenous contrast. COMPARISON:  Prior study from 07/18/2016. FINDINGS: Brain: Parietal predominant cerebral atrophy, progressed as compared to prior exam from 2018. Probable mild chronic microvascular ischemic disease noted involving the periventricular and deep white matter. Small remote lacunar infarct noted involving the right caudate body. No acute intracranial hemorrhage. No visible  acute large vessel territory infarct. No mass lesion, mass effect, or midline shift. No hydrocephalus or extra-axial fluid collection. Vascular: No hyperdense vessel. Scattered vascular calcifications noted within the carotid siphons. Skull: No scalp soft tissue abnormality.  Calvarium intact. Sinuses/Orbits: Globes and orbital soft tissues demonstrate no acute finding. Scattered mucoperiosteal thickening noted throughout the paranasal sinuses. No air-fluid levels to suggest acute sinusitis. Mastoid air cells and middle ear cavities are well pneumatized and free of fluid. Other: None. IMPRESSION: 1. No acute intracranial abnormality. 2. Parietal predominant cerebral atrophy with mild chronic small vessel ischemic disease, progressed as compared to prior exam from 2018. 3. 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 Electronically Signed   By: Jeannine Boga M.D.   On: 08/22/2020 04:35   CT Cervical Spine Wo Contrast  Result Date: 08/22/2020 CLINICAL DATA:  Initial evaluation for intermittent left upper extremity and facial numbness for 4 days. EXAM: CT CERVICAL SPINE WITHOUT CONTRAST TECHNIQUE: Multidetector CT imaging of the cervical spine was performed without intravenous contrast. Multiplanar CT image reconstructions were also generated. COMPARISON:  Prior CT of the neck from 04/22/2020. FINDINGS: Alignment: Please note that this examination was performed during a downtime and is somewhat technically limited as the scanner abruptly froze/stopped during image acquisition. Axial images of the cervical spine in bone and soft tissue window algorithm are provided, however, the coronal and sagittal reformatted images were constructed through post processing, and are of markedly low resolution and fairly limited. Vertebral bodies normally aligned with preservation of the normal cervical lordosis. No significant listhesis. Skull base and vertebrae: Skull base grossly intact. Normal C1-2  articulations are preserved. Dens grossly intact. Vertebral body height maintained. No visible fracture on this technically limited exam. No discrete lytic or blastic osseous lesions. Soft tissues and spinal canal: Postoperative changes from recent tumor resection with nodal dissection seen within the oropharynx and neck. Mild hazy stranding within the right greater than left neck favored to be postoperative in nature. No significant prevertebral edema. Atherosclerotic calcification noted about the right carotid bifurcation. Disc levels: C2-3: Negative interspace. Left-sided facet hypertrophy. No stenosis. C3-4: Disc bulge with bilateral uncovertebral hypertrophy. Superimposed  small central disc protrusion. No significant spinal stenosis. Mild to moderate right greater than left C4 foraminal narrowing. C4-5: Central disc protrusion indents the ventral thecal sac, contacting the ventral cord (series 9, image 66). Mild spinal stenosis with minimal cord flattening. Superimposed uncovertebral and facet hypertrophy with mild to moderate bilateral C5 foraminal narrowing. C5-6: Degenerative intervertebral disc space narrowing with diffuse disc osteophyte complex, eccentric to the right. Flattening of the ventral thecal sac with resultant mild spinal stenosis. Severe right with moderate left C6 foraminal narrowing. C6-7: Degenerative intervertebral disc space narrowing with diffuse disc osteophyte complex. Posterior component indents and partially faces the ventral thecal sac with at least mild spinal stenosis. Moderate left with mild right C7 foraminal narrowing. C7-T1: Negative interspace. Mild facet hypertrophy. No significant stenosis. Upper chest: Visualized upper chest demonstrates no acute finding. Emphysematous changes noted within the visualized lungs. Other: None. IMPRESSION: 1. Limited exam due to technical difficulty during image acquisition as described above. 2. No definite acute osseous abnormality within the  cervical spine. 3. Central disc protrusion at C4-5 with resultant mild spinal stenosis with minimal cord flattening. 4. Disc osteophyte complexes at C5-6 and C6-7 with resultant mild spinal stenosis. Severe right with moderate left C6 foraminal narrowing, with moderate left C7 foraminal stenosis. 5. Postoperative changes from recent tumor resection with nodal dissection within the oropharynx and neck. No visible adverse features. 6. Emphysema (ICD10-J43.9). Electronically Signed   By: Jeannine Boga M.D.   On: 08/22/2020 05:01   MR BRAIN WO CONTRAST  Result Date: 08/22/2020 CLINICAL DATA:  Initial evaluation for left facial and upper extremity numbness for 4 days. EXAM: MRI HEAD WITHOUT CONTRAST TECHNIQUE: Multiplanar, multiecho pulse sequences of the brain and surrounding structures were obtained without intravenous contrast. COMPARISON:  CT from earlier the same day. FINDINGS: Brain: Diffuse prominence of the CSF containing spaces compatible with moderately advanced cerebral and cerebellar atrophy, more pronounced on the right. Patchy T2/FLAIR hyperintensity within the periventricular deep white matter both cerebral hemispheres most consistent with chronic small vessel ischemic disease, moderately advanced in nature. Small area of encephalomalacia and gliosis involving the cortical and subcortical left parietal lobe consistent with a chronic ischemic infarct (series 11, image 19). Associated chronic hemosiderin staining present within this region. There is a well demarcated somewhat elongated focus of diffusion signal abnormality involving medial right thalamus measuring 1.9 cm (series 5, image 78). Associated T2/FLAIR signal hyperintensity without discernible ADC correlate. Finding is favored to reflect an evolving subacute ischemic infarct. No significant mass effect or associated hemorrhage. No other diffusion abnormality to suggest acute or subacute ischemia. Gray-white matter differentiation  otherwise maintained. No other areas of acute or chronic hemorrhage. No other mass lesion, midline shift or mass effect. No hydrocephalus or extra-axial fluid collection. Partially empty sella noted. Vascular: Major intracranial vascular flow voids are maintained. Skull and upper cervical spine: Craniocervical junction normal. Bone marrow signal intensity within normal limits. No scalp soft tissue abnormality. Sinuses/Orbits: Globes and orbital soft tissues within normal limits. Scattered mucosal thickening noted throughout the paranasal sinuses. No significant mastoid effusion. Inner ear structures grossly normal. Other: None. IMPRESSION: 1. 1.9 cm focus of diffusion signal abnormality involving the medial right thalamus, favored to reflect an evolving subacute ischemic infarct. No associated hemorrhage or mass effect. A short interval follow-up MRI to ensure these changes resolve is recommended, particularly given the patient history of malignancy, as an underlying neoplasm could conceivably have this appearance as well. 2. No other acute intracranial abnormality. 3. Moderately advanced  cerebral and cerebellar atrophy with chronic small vessel ischemic disease. Superimposed small remote left parietal infarct. Electronically Signed   By: Jeannine Boga M.D.   On: 08/22/2020 19:52   MR Cervical Spine Wo Contrast  Result Date: 08/22/2020 CLINICAL DATA:  Initial evaluation for left facial and upper extremity numbness for 4 days. EXAM: MRI CERVICAL SPINE WITHOUT CONTRAST TECHNIQUE: Multiplanar, multisequence MR imaging of the cervical spine was performed. No intravenous contrast was administered. COMPARISON:  CT from earlier the same day. FINDINGS: Alignment: Straightening of the normal cervical lordosis. Trace anterolisthesis of C5 on C6. Vertebrae: Vertebral body height maintained without acute or chronic fracture. Bone marrow signal intensity within normal limits. No discrete or worrisome osseous lesions.  No abnormal marrow edema. Cord: Normal signal and morphology. Posterior Fossa, vertebral arteries, paraspinal tissues: Craniocervical junction normal. Paraspinous and prevertebral soft tissues demonstrate no acute finding. Normal flow voids seen within the carotid and vertebral arteries bilaterally. Disc levels: C2-C3: Normal interspace. Mild left greater than right facet hypertrophy. No canal or foraminal stenosis. C3-C4: Mild disc bulge with uncovertebral hypertrophy. Superimposed small central disc protrusion indents the ventral thecal sac (series 10, image 17). No significant spinal stenosis or cord deformity. Moderate bilateral C4 foraminal stenosis. C4-C5: Central/right paracentral disc protrusion indents the ventral thecal sac, contacting and mildly flattening the ventral cord. No cord signal changes. Mild spinal stenosis. Mild to moderate bilateral C5 foraminal narrowing. C5-C6: Trace anterolisthesis. Degenerative intervertebral disc space narrowing with diffuse disc osteophyte complex, eccentric to the right. Broad posterior component flattens and effaces the ventral thecal sac with resultant mild to moderate spinal stenosis. Minimal cord flattening without cord signal changes. Severe right with moderate left C6 foraminal narrowing. C6-C7: Degenerative intervertebral disc space narrowing with diffuse disc osteophyte complex, slightly eccentric to the right. Posterior component flattens and partially effaces the ventral thecal sac, greater on the right. Mild spinal stenosis without frank cord impingement. Moderate left C7 foraminal narrowing. Right neural foramina remains patent. C7-T1: Negative interspace.  Mild facet hypertrophy.  No stenosis. Visualized upper thoracic spine demonstrates no significant finding. IMPRESSION: 1. No acute abnormality within the cervical spine. 2. 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 3. Multifactorial degenerative  changes with resultant multilevel foraminal narrowing as above. Notable findings include moderate bilateral C4 foraminal stenosis, severe right with moderate left C6 foraminal narrowing, with moderate left C7 foraminal stenosis. Electronically Signed   By: Jeannine Boga M.D.   On: 08/22/2020 20:03    Procedures Procedures   Medications Ordered in ED Medications  aspirin chewable tablet 81 mg (81 mg Oral Given 08/22/20 2304)    ED Course  I have reviewed the triage vital signs and the nursing notes.  Pertinent labs & imaging results that were available during my care of the patient were reviewed by me and considered in my medical decision making (see chart for details).    MDM Rules/Calculators/A&P                           70 year old male with subjective unilateral numbness x4 days.  Patient describes waxing and waning symptoms.  Vital signs reviewed, the patient is mildly hypertensive here in the emergency department.  Physical exam is notable for subjectively decreased sensation to fine touch in the distal left upper extremity, but no other focal neurologic deficits.  Symptoms include stroke, TIA, partial seizure.  He does not describe any infectious symptoms that  would be concerning for meningitis or encephalitis.  Patient seen at outside hospital.  CT scan showed prior infarcts and he was transferred here for further imaging.  Will obtain MRI of the brain without.  We will also obtain vessel imaging given the patient's previous carotid disease.  MRI shows possible subacute right-sided thalamic infarct versus potential malignancy.  Given the patient's tonsillar malignancy, either could certainly be possible.  Neurology consulted in the emergency department.  They recommend starting aspirin and admission to the hospital for observation overnight and further work-up.  Hospitalist consulted for admission, handoff given.  Final Clinical Impression(s) / ED Diagnoses Final diagnoses:   Numbness    Rx / DC Orders ED Discharge Orders     None        Claud Kelp, MD 08/22/20 [id number]    Noemi Chapel, MD 08/24/20 1218

## 2020-08-22 NOTE — Consult Note (Signed)
Neurology Consultation Reason for Consult: 4 days of intermittent left-sided numbness Requesting Physician: Lennice Sites  CC: Intermittent sensory symptoms  History is obtained from: Patient and chart review  HPI: Samuel Serrano is a 70 y.o. male with a past medical history significant for hypertension, peripheral vascular disease, left carotid endarterectomy (2018), throat cancer s/p surgery, ongoing tobacco use (1 pack/day, quit 05/17/2020, recently restarted and planning to quit this Sunday)  He reports that on Sunday he began to have tingling/numbness in the left upper extremity which gradually progressed down his arm over the course of hours and went into his hand.  It then resolved in his arm but persisted in his hand.  The entire episode lasted for 3 to 4 hours and symptoms were very waxing and waning, recurring throughout Sunday and Monday though with reducing intensity.  Tuesday he was asymptomatic.  However Wednesday his symptoms started again.  Currently he has tingling/numbness only in his left hand.  He denies any other focal neurological symptoms, infectious symptoms, cardiopulmonary symptoms or other systemic problems on review of systems.   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  He is followed at [facility] by Dr. Fredricka Bonine.  He had a right radical tonsillectomy partial pharyngectomy and modified radical neck dissection on 05/18/2020 and was last seen on 07/03/2020 at which time he was healing well.  He does note that he has had some issues with right shoulder mobility since his surgery which he attributed to potential nerve damage during the procedure.  He was undergoing PT/OT for this but stopped due to lack of improvement and is planning to see his primary care physician for further evaluation of this issue  LKW: Sunday 8/11 tPA given?: No, due to out of the window Premorbid  modified rankin scale:      0 - No symptoms.  ROS: All other review of systems was negative except as noted in the HPI.   Past Medical History:  Diagnosis Date   Cancer [facility])    Carotid artery occlusion    Peripheral vascular disease (HCC)    left carotid stenosis   Throat cancer [facility])    Past Surgical History:  Procedure Laterality Date   COLONOSCOPY     CYST EXCISION     benign cysts in breasts at age 21   ENDARTERECTOMY Left 07/24/2016   Procedure: Left Carotid ENDARTERECTOMY;  Surgeon: Rosetta Posner, MD;  Location: [facility] OR;  Service: Vascular;  Laterality: Left;   PATCH ANGIOPLASTY Left 07/24/2016   Procedure: PATCH ANGIOPLASTY;  Surgeon: Rosetta Posner, MD;  Location: [facility] OR;  Service: Vascular;  Laterality: Left;   throat cancer surgery     WISDOM TOOTH EXTRACTION     Current Outpatient Medications  Medication Instructions   buPROPion (WELLBUTRIN SR) 150 MG 12 hr tablet Start one week before quit date. Take 1 tab daily x 3 days, then 1 tab BID thereafter.   fluorometholone (FML) 0.1 % ophthalmic suspension 1 drop, Left Eye, 4 times daily   hydrOXYzine (ATARAX/VISTARIL) 100 mg, Oral, Daily at bedtime   naproxen sodium (ALEVE) 220 mg, Oral, Daily PRN   neomycin-polymyxin b-dexamethasone (MAXITROL) 0.3-21224-8.2 OINT 1 application, Left Eye, 2 times daily   nicotine (NICODERM CQ - DOSED IN MG/24 HOURS) 21 mg, Transdermal, Daily, Use for 6 weeks, then taper to 108m dose.   nicotine (NICODERM CQ - DOSED IN MG/24 HOURS) 14 mg, Transdermal, Daily,  Use for 2 weeks, then taper to 89m dose.   nicotine (NICODERM CQ - DOSED IN MG/24 HR) 7 mg, Transdermal, Daily, Use for 2 weeks.     Family History  Problem Relation Age of Onset   Breast cancer Mother    Diabetes Father    Hypertension Father    Colon cancer Neg Hx    Esophageal cancer Neg Hx    Liver cancer Neg Hx    Pancreatic cancer Neg Hx    Rectal cancer Neg Hx    Stomach cancer Neg Hx    Social History:  reports that he  has been smoking cigarettes. He has never used smokeless tobacco. He reports current alcohol use. He reports that he does not use drugs.   Exam: Current vital signs: BP (!) 173/96   Pulse 74   Temp 98 F (36.7 C) (Oral)   Resp 16   SpO2 98%  Vital signs in last 24 hours: Temp:  [97.5 F (36.4 C)-98 F (36.7 C)] 98 F (36.7 C) (08/11 1240) Pulse Rate:  [73-89] 74 (08/11 1942) Resp:  [16-20] 16 (08/11 1942) BP: (156-202)/(79-105) 173/96 (08/11 2031) SpO2:  [96 %-99 %] 98 % (08/11 1942) Weight:  [111.6 kg] 111.6 kg (08/11 0035)   Physical Exam  Constitutional: Appears well-developed and well-nourished.  Psych: Affect appropriate to situation, calm and cooperative Eyes: No scleral injection HENT: No oropharyngeal obstruction.  Postsurgical changes of the right pharynx and neck, well-healed MSK: no joint deformities.  Cardiovascular: Normal rate and regular rhythm.  Respiratory: Effort normal, non-labored breathing GI: Soft.  No distension. There is no tenderness.  Skin: Warm dry and intact visible skin  Neuro: Mental Status: Patient is awake, alert, oriented to person, place, month, year, and situation. Patient is able to give a clear and coherent history. No signs of aphasia or neglect Cranial Nerves: II: Visual Fields are full. Pupils are equal, round, and reactive to light.   III,IV, VI: EOMI without ptosis or diploplia.  V: Facial sensation is symmetric to temperature VII: Facial movement is symmetric.  VIII: hearing is intact to voice X: Palate is slightly lower on the right, likely secondary to surgery XI: Shoulder shrug is symmetric. XII: tongue deviates slightly to the right without atrophy or fasciculations.  Motor: Tone is normal. Bulk is normal. 5/5 strength was present in all four extremities.  Sensory: Sensation is symmetric to light touch and temperature in the arms and legs EXCEPT for some mild tingling/numbness in the left hand without extinction to  double simultaneous stimuli Deep Tendon Reflexes: 2+ and symmetric in the brachioradialis and patellae.  Plantars: Toes are downgoing bilaterally.  Cerebellar: FNF and HKS are intact bilaterally  NIHSS total 1 Score breakdown: One-point for sensory change  I have reviewed labs in epic and the results pertinent to this consultation are: CMP notable only for mildly elevated glucose at 118 CBC unremarkable UA negative, UDS pending  I have reviewed the images obtained: MRI brain personally reviewed.  There is a right thalamic diffusion restriction lesion with associated FLAIR change and minimal ADC correlate.  Agree with radiology read of subacute infarct versus malignancy noted.    CTA head and neck personally reviewed, agree with radiology: 1. Negative CTA for large vessel occlusion. 2. Short-segment severe proximal right P2 stenosis. Right PCA attenuated but patent distally. Finding is in keeping with the previously identified subacute right thalamic infarct. 3. 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 4. Prior left carotid endarterectomy without  residual or recurrent stenosis. 5. No other hemodynamically significant or correctable stenosis within the major arterial vasculature of the head and neck. 6. Postoperative changes from recent tumor resection and lymph node dissection within the neck. 7. Emphysema (ICD10-J43.9).  Impression:  This is a 70 year old male with stroke risk factors of hypertension, smoking, and known vascular disease (carotid atherosclerosis and peripheral vascular disease).  While lacunar strokes can have a stuttering course the description of progression of sensory symptoms over hours and a gradual slow jacksonian march is atypical and more concerning of a potential focal seizure. Unlikely to be able to pick this up on EEG given the deep location of the thalamus, but nevertheless we will obtain an EEG.  Additionally on imaging there is ADC  pseudonormalization of the restricted diffusion, which is typically seen in the 10 to 15-day range, and the patient reports symptoms started only 5 days ago.  Given his stroke risk factors, and potential culprit stenosis of the right P2 PCA, will proceed with stroke work-up but also obtain MRI brain with contrast to better assess for potential malignancy  Recommendations: # Right thalamic lesion, stroke versus possible malignancy - Stroke labs TSH, ESR, RPR, HgbA1c, fasting lipid panel - MRI brain with contrast to better evaluate for potential malignancy - Would plan short interval MRI brain with and without contrast in 2 to 3 months or sooner if new symptoms arise to continue to monitor for for malignancy given the atypical features for stroke in this patient's history and imaging - Frequent neuro checks - Echocardiogram - Aspirin 81 mg daily - Hold on Plavix for now in case further procedures are indicated based on MRI brain, if no short interval procedures are indicated based on further imaging, would plan on a 90-day course of DAPT - Risk factor modification - Telemetry monitoring  - Blood pressure goal normotension as patient is out of the permissive hypertension window, blood pressure should be gradually normalized over a few days - No need for PT/OT given isolated sensory symptoms - Stroke team to follow  Lesleigh Noe MD-PhD [facility] [phone] Available 7 PM to 7 AM, outside of these hours please call Neurologist on call as listed on Amion.

## 2020-08-22 NOTE — ED Notes (Signed)
Pt going POV to [facility] for MRI, Report called to [facility], Therapist, sports .  Pt stable for transfer

## 2020-08-22 NOTE — ED Triage Notes (Signed)
Intermittent Left arm,hand and left facial numbness x4 days and started back again today around 1430.    Denies weakness, slurring of speech and any pain.

## 2020-08-22 NOTE — ED Provider Notes (Signed)
[facility] EMERGENCY DEPARTMENT Provider Note   CSN: [id number] Arrival date & time: 08/22/20  1229     History Chief Complaint  Patient presents with   Numbness    Samuel Serrano is a 70 y.o. male.  Patient here with left facial numbness, left upper arm numbness for the last 4 days.  States that he is still numb to the left side of his forehead and just mostly down his left hand.  Felt like it was his whole left side of his face and his whole left upper extremity.  Denies any headache, weakness, speech changes, vision changes.  History of arterial disease in his carotid in the past, denies any prior stroke.  The history is provided by the patient.  Neurologic Problem This is a new problem. Episode onset: 4 days ago. The problem occurs constantly. The problem has been gradually improving. Pertinent negatives include no chest pain, no abdominal pain, no headaches and no shortness of breath. Nothing aggravates the symptoms. Nothing relieves the symptoms. He has tried nothing for the symptoms. The treatment provided mild relief.      Past Medical History:  Diagnosis Date   Cancer [facility])    Carotid artery occlusion    Peripheral vascular disease (HCC)    left carotid stenosis   Throat cancer [facility])     Patient Active Problem List   Diagnosis Date Noted   Malignant neoplasm of tonsillar fossa ([place]) 05/07/2020   Carotid stenosis 07/24/2016    Past Surgical History:  Procedure Laterality Date   COLONOSCOPY     CYST EXCISION     benign cysts in breasts at age 61   ENDARTERECTOMY Left 07/24/2016   Procedure: Left Carotid ENDARTERECTOMY;  Surgeon: Rosetta Posner, MD;  Location: [facility] OR;  Service: Vascular;  Laterality: Left;   PATCH ANGIOPLASTY Left 07/24/2016   Procedure: PATCH ANGIOPLASTY;  Surgeon: Rosetta Posner, MD;  Location: [facility] OR;  Service: Vascular;  Laterality: Left;   throat cancer surgery     WISDOM TOOTH EXTRACTION         Family History  Problem Relation  Age of Onset   Breast cancer Mother    Diabetes Father    Hypertension Father    Colon cancer Neg Hx    Esophageal cancer Neg Hx    Liver cancer Neg Hx    Pancreatic cancer Neg Hx    Rectal cancer Neg Hx    Stomach cancer Neg Hx     Social History   Tobacco Use   Smoking status: Every Day    Packs/day: 0.00    Types: Cigarettes   Smokeless tobacco: Never   Tobacco comments:    1 1/4 pk per day  Vaping Use   Vaping Use: Never used  Substance Use Topics   Alcohol use: Yes    Comment: occasional   Drug use: No    Home Medications Prior to Admission medications   Medication Sig Start Date End Date Taking? Authorizing Provider  buPROPion (WELLBUTRIN SR) 150 MG 12 hr tablet Start one week before quit date. Take 1 tab daily x 3 days, then 1 tab BID thereafter. 05/06/20   Eppie Gibson, MD  hydrOXYzine (ATARAX/VISTARIL) 25 MG tablet Take 25-50 mg by mouth at bedtime as needed. 04/04/20   [provider]  nicotine (NICODERM CQ - DOSED IN MG/24 HOURS) 14 mg/24hr patch Place 1 patch (14 mg total) onto the skin daily. Use for 2 weeks, then taper to '7mg'$  dose.  05/06/20   Eppie Gibson, MD  nicotine (NICODERM CQ - DOSED IN MG/24 HOURS) 21 mg/24hr patch Place 1 patch (21 mg total) onto the skin daily. Use for 6 weeks, then taper to '14mg'$  dose. 05/06/20   Eppie Gibson, MD  nicotine (NICODERM CQ - DOSED IN MG/24 HR) 7 mg/24hr patch Place 1 patch (7 mg total) onto the skin daily. Use for 2 weeks. 05/06/20   Eppie Gibson, MD    Allergies    No known allergies  Review of Systems   Review of Systems  Constitutional:  Negative for chills and fever.  HENT:  Negative for ear pain and sore throat.   Eyes:  Negative for pain and visual disturbance.  Respiratory:  Negative for cough and shortness of breath.   Cardiovascular:  Negative for chest pain and palpitations.  Gastrointestinal:  Negative for abdominal pain and vomiting.  Genitourinary:  Negative for dysuria and hematuria.   Musculoskeletal:  Negative for arthralgias and back pain.  Skin:  Negative for color change and rash.  Neurological:  Positive for numbness. Negative for dizziness, tremors, seizures, syncope, facial asymmetry, speech difficulty, weakness, light-headedness and headaches.  All other systems reviewed and are negative.  Physical Exam Updated Vital Signs BP (!) 178/105 (BP Location: Right Arm)   Pulse 79   Temp 98 F (36.7 C) (Oral)   Resp 18   SpO2 96%   Physical Exam Vitals and nursing note reviewed.  Constitutional:      General: He is not in acute distress.    Appearance: He is well-developed. He is not ill-appearing.  HENT:     Head: Normocephalic and atraumatic.     Nose: Nose normal.     Mouth/Throat:     Mouth: Mucous membranes are moist.  Eyes:     Extraocular Movements: Extraocular movements intact.     Conjunctiva/sclera: Conjunctivae normal.     Pupils: Pupils are equal, round, and reactive to light.  Cardiovascular:     Rate and Rhythm: Normal rate and regular rhythm.     Pulses: Normal pulses.     Heart sounds: Normal heart sounds. No murmur heard. Pulmonary:     Effort: Pulmonary effort is normal. No respiratory distress.     Breath sounds: Normal breath sounds.  Abdominal:     Palpations: Abdomen is soft.     Tenderness: There is no abdominal tenderness.  Musculoskeletal:        General: No tenderness. Normal range of motion.     Cervical back: Normal range of motion and neck supple. No tenderness.     Comments: No midline spinal tenderness  Skin:    General: Skin is warm and dry.  Neurological:     General: No focal deficit present.     Mental Status: He is alert.     Cranial Nerves: No cranial nerve deficit.     Motor: No weakness.     Coordination: Coordination normal.     Gait: Gait normal.     Comments: 5+ out of 5 strength throughout, decreased sensation of the left side of his forehead, left hand but otherwise normal sensation, normal speech,  normal visual fields, no drift    ED Results / Procedures / Treatments   Labs (all labs ordered are listed, but only abnormal results are displayed) Labs Reviewed - No data to display  EKG None  Radiology CT HEAD WO CONTRAST (5MM)  Result Date: 08/22/2020 CLINICAL DATA:  Initial evaluation for neuro deficit, stroke suspected,  intermittent left upper extremity and facial numbness. EXAM: CT HEAD WITHOUT CONTRAST TECHNIQUE: Contiguous axial images were obtained from the base of the skull through the vertex without intravenous contrast. COMPARISON:  Prior study from 07/18/2016. FINDINGS: Brain: Parietal predominant cerebral atrophy, progressed as compared to prior exam from 2018. Probable mild chronic microvascular ischemic disease noted involving the periventricular and deep white matter. Small remote lacunar infarct noted involving the right caudate body. No acute intracranial hemorrhage. No visible acute large vessel territory infarct. No mass lesion, mass effect, or midline shift. No hydrocephalus or extra-axial fluid collection. Vascular: No hyperdense vessel. Scattered vascular calcifications noted within the carotid siphons. Skull: No scalp soft tissue abnormality.  Calvarium intact. Sinuses/Orbits: Globes and orbital soft tissues demonstrate no acute finding. Scattered mucoperiosteal thickening noted throughout the paranasal sinuses. No air-fluid levels to suggest acute sinusitis. Mastoid air cells and middle ear cavities are well pneumatized and free of fluid. Other: None. IMPRESSION: 1. No acute intracranial abnormality. 2. Parietal predominant cerebral atrophy with mild chronic small vessel ischemic disease, progressed as compared to prior exam from 2018. 3. 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 Electronically Signed   By: Jeannine Boga M.D.   On: 08/22/2020 04:35   CT Cervical Spine Wo Contrast  Result Date: 08/22/2020 CLINICAL DATA:  Initial evaluation for  intermittent left upper extremity and facial numbness for 4 days. EXAM: CT CERVICAL SPINE WITHOUT CONTRAST TECHNIQUE: Multidetector CT imaging of the cervical spine was performed without intravenous contrast. Multiplanar CT image reconstructions were also generated. COMPARISON:  Prior CT of the neck from 04/22/2020. FINDINGS: Alignment: Please note that this examination was performed during a downtime and is somewhat technically limited as the scanner abruptly froze/stopped during image acquisition. Axial images of the cervical spine in bone and soft tissue window algorithm are provided, however, the coronal and sagittal reformatted images were constructed through post processing, and are of markedly low resolution and fairly limited. Vertebral bodies normally aligned with preservation of the normal cervical lordosis. No significant listhesis. Skull base and vertebrae: Skull base grossly intact. Normal C1-2 articulations are preserved. Dens grossly intact. Vertebral body height maintained. No visible fracture on this technically limited exam. No discrete lytic or blastic osseous lesions. Soft tissues and spinal canal: Postoperative changes from recent tumor resection with nodal dissection seen within the oropharynx and neck. Mild hazy stranding within the right greater than left neck favored to be postoperative in nature. No significant prevertebral edema. Atherosclerotic calcification noted about the right carotid bifurcation. Disc levels: C2-3: Negative interspace. Left-sided facet hypertrophy. No stenosis. C3-4: Disc bulge with bilateral uncovertebral hypertrophy. Superimposed small central disc protrusion. No significant spinal stenosis. Mild to moderate right greater than left C4 foraminal narrowing. C4-5: Central disc protrusion indents the ventral thecal sac, contacting the ventral cord (series 9, image 66). Mild spinal stenosis with minimal cord flattening. Superimposed uncovertebral and facet hypertrophy  with mild to moderate bilateral C5 foraminal narrowing. C5-6: Degenerative intervertebral disc space narrowing with diffuse disc osteophyte complex, eccentric to the right. Flattening of the ventral thecal sac with resultant mild spinal stenosis. Severe right with moderate left C6 foraminal narrowing. C6-7: Degenerative intervertebral disc space narrowing with diffuse disc osteophyte complex. Posterior component indents and partially faces the ventral thecal sac with at least mild spinal stenosis. Moderate left with mild right C7 foraminal narrowing. C7-T1: Negative interspace. Mild facet hypertrophy. No significant stenosis. Upper chest: Visualized upper chest demonstrates no acute finding. Emphysematous changes noted within the visualized lungs. Other:  None. IMPRESSION: 1. Limited exam due to technical difficulty during image acquisition as described above. 2. No definite acute osseous abnormality within the cervical spine. 3. Central disc protrusion at C4-5 with resultant mild spinal stenosis with minimal cord flattening. 4. Disc osteophyte complexes at C5-6 and C6-7 with resultant mild spinal stenosis. Severe right with moderate left C6 foraminal narrowing, with moderate left C7 foraminal stenosis. 5. Postoperative changes from recent tumor resection with nodal dissection within the oropharynx and neck. No visible adverse features. 6. Emphysema (ICD10-J43.9). Electronically Signed   By: Jeannine Boga M.D.   On: 08/22/2020 05:01    Procedures Procedures   Medications Ordered in ED Medications - No data to display  ED Course  I have reviewed the triage vital signs and the nursing notes.  Pertinent labs & imaging results that were available during my care of the patient were reviewed by me and considered in my medical decision making (see chart for details).    MDM Rules/Calculators/A&P                           Ruffin Pyo is here with left facial numbness, left upper extremity numbness.   Symptoms for the last several days.  Seems to be improving.  States that he felt most of his left face was numb but now just his forehead.  States that most of his left upper extremity was numb last several days but now mostly his hand.  Denies any neck pain.  Had lab work, CT scan of head and neck at [facility] earlier today and then left without being seen and arrived here.  Neurologically he is overall intact and appears well.  EKG shows sinus rhythm.  No ischemic changes.  Not having any chest pain.  He does have some sensory changes over the left forehead and left hand but otherwise neuro exam is unremarkable.  He has had some carotid disease in the past but overall suspect that this could be a small vessel stroke.  His CT scan of his head did show remote lacunar infarct and he denies any history of strokes.  He is hypertensive.  Suspect that this could be a stroke and we will get an MRI of his head and we will also get an MRI of his C-spine given that his C-spine did have some stenotic disease.  This could be some radicular type pain but does not make sense with left-sided facial numbness.  We will MRI the brain and the C-spine and send [facility].  I think that it is safe for him to go personal vehicle and he prefers that.  Talked with Dr. Karle Starch who is aware of his transfer.  Neurology can be consulted if abnormality is found.  Hemodynamically stable at this time.  Transfer to [employer].  This chart was dictated using voice recognition software.  Despite best efforts to proofread,  errors can occur which can change the documentation meaning.   Final Clinical Impression(s) / ED Diagnoses Final diagnoses:  Numbness    Rx / DC Orders ED Discharge Orders     None        Lennice Sites, DO 08/22/20 1550

## 2020-08-23 ENCOUNTER — Inpatient Hospital Stay (HOSPITAL_COMMUNITY): Payer: Medicare Other

## 2020-08-23 DIAGNOSIS — R202 Paresthesia of skin: Secondary | ICD-10-CM | POA: Diagnosis not present

## 2020-08-23 DIAGNOSIS — E7800 Pure hypercholesterolemia, unspecified: Secondary | ICD-10-CM

## 2020-08-23 DIAGNOSIS — I6389 Other cerebral infarction: Secondary | ICD-10-CM

## 2020-08-23 DIAGNOSIS — Z720 Tobacco use: Secondary | ICD-10-CM | POA: Diagnosis not present

## 2020-08-23 DIAGNOSIS — I69369 Other paralytic syndrome following cerebral infarction affecting unspecified side: Secondary | ICD-10-CM

## 2020-08-23 DIAGNOSIS — I639 Cerebral infarction, unspecified: Secondary | ICD-10-CM | POA: Diagnosis not present

## 2020-08-23 DIAGNOSIS — R200 Anesthesia of skin: Secondary | ICD-10-CM | POA: Diagnosis not present

## 2020-08-23 LAB — LIPID PANEL
Cholesterol: 202 mg/dL — ABNORMAL HIGH (ref 0–200)
HDL: 39 mg/dL — ABNORMAL LOW (ref >40)
LDL Cholesterol: 139 mg/dL — ABNORMAL HIGH (ref 0–99)
Total CHOL/HDL Ratio: 5.2 RATIO
Triglycerides: 119 mg/dL (ref <150)
VLDL: 24 mg/dL (ref 0–40)

## 2020-08-23 LAB — HEMOGLOBIN A1C
Hgb A1c MFr Bld: 4.9 % (ref 4.8–5.6)
Mean Plasma Glucose: 93.93 mg/dL

## 2020-08-23 LAB — ECHOCARDIOGRAM COMPLETE
Area-P 1/2: 4.06 cm2
Calc EF: 45.9 %
Height: 73 in
Single Plane A2C EF: 50.3 %
Single Plane A4C EF: 41.4 %
Weight: 3936.53 oz

## 2020-08-23 LAB — RESP PANEL BY RT-PCR (FLU A&B, COVID) ARPGX2
Influenza A by PCR: NEGATIVE
Influenza B by PCR: NEGATIVE
SARS Coronavirus 2 by RT PCR: NEGATIVE

## 2020-08-23 LAB — HIV ANTIBODY (ROUTINE TESTING W REFLEX): HIV Screen 4th Generation wRfx: NONREACTIVE

## 2020-08-23 IMAGING — CT CT ANGIO HEAD
3 of 4 series · 11 of 33 positions shown · IV contrast (omnipaque)
Comparison: MRI from [DATE].

CLINICAL DATA: Initial evaluation for neuro deficit, stroke
suspected.

EXAM:
CT ANGIOGRAPHY HEAD AND NECK
TECHNIQUE: Multidetector CT imaging of the head and neck was performed using
the standard protocol during bolus administration of intravenous
contrast. Multiplanar CT image reconstructions and MIPs were
obtained to evaluate the vascular anatomy. Carotid stenosis
measurements (when applicable) are obtained utilizing NASCET
criteria, using the distal internal carotid diameter as the
denominator.
CONTRAST:  100mL OMNIPAQUE IOHEXOL 350 MG/ML SOLN

[Series 3: head wo · axial · 0.46mm/px · z∈[+1371,+1426]mm · 2 of 34 slices shown]
[im 12/34  soft-tissue]
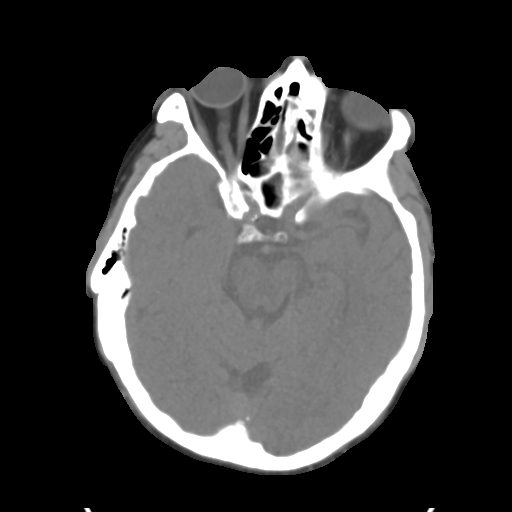
[im 23/34  bone]
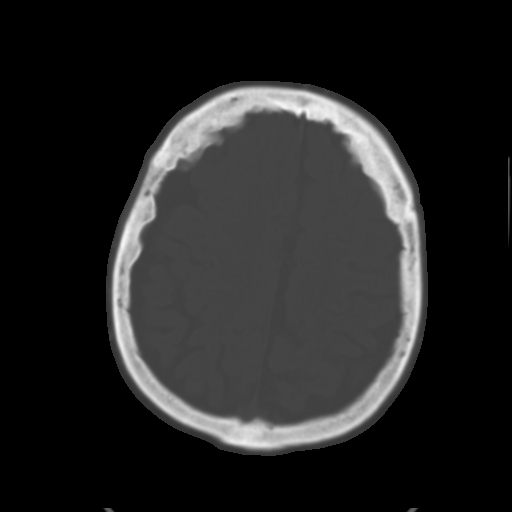

[Series 4: head bone · axial · 0.46mm/px · z∈[+1334,+1464]mm · 8 of 85 slices shown]
[im 10/85  bone]
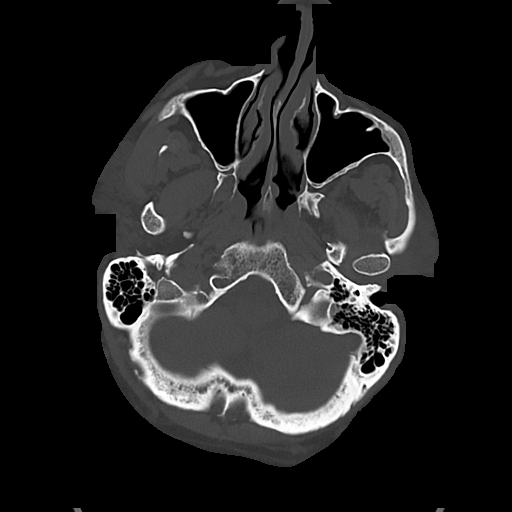
[im 19/85  bone]
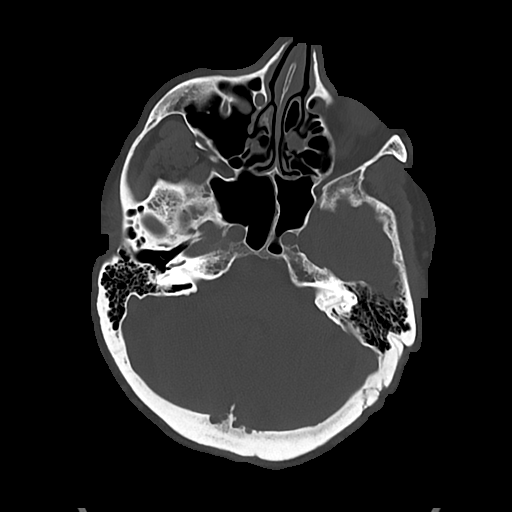
[im 29/85  bone]
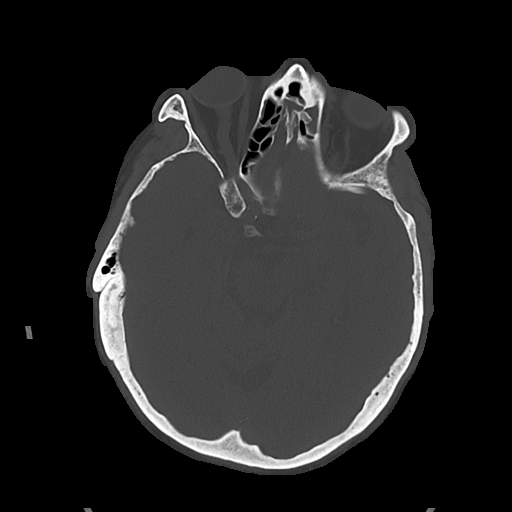
[im 38/85  bone]
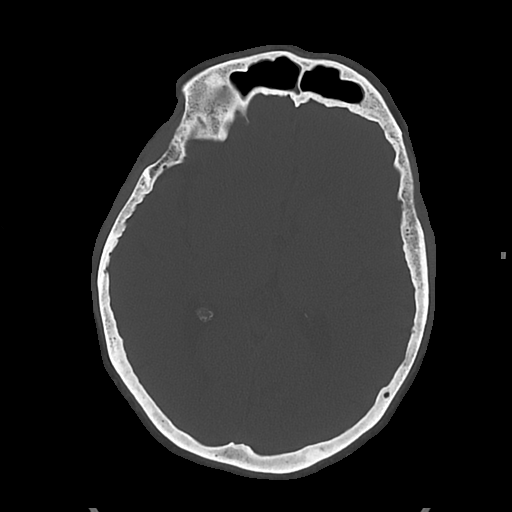
[im 47/85  bone]
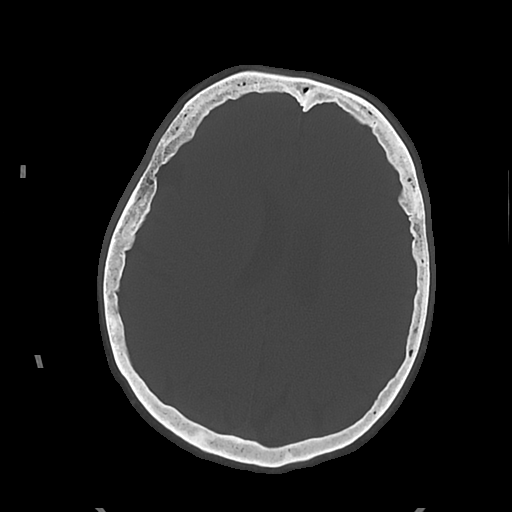
[im 57/85  bone]
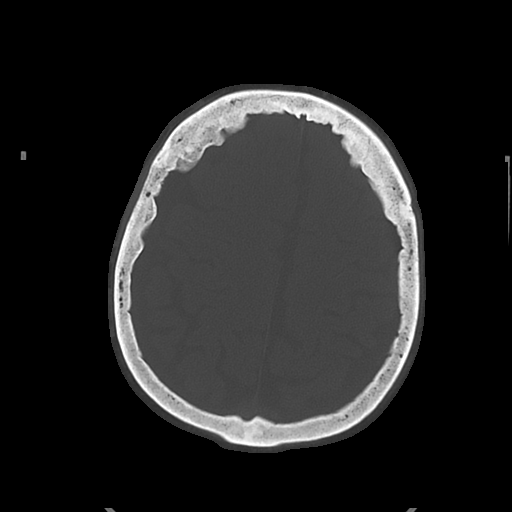
[im 66/85  bone]
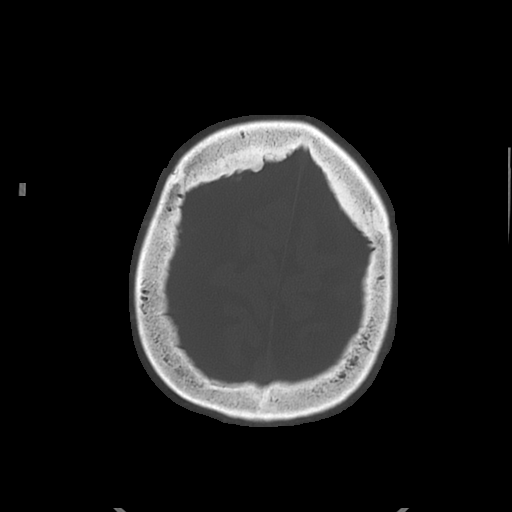
[im 75/85  bone]
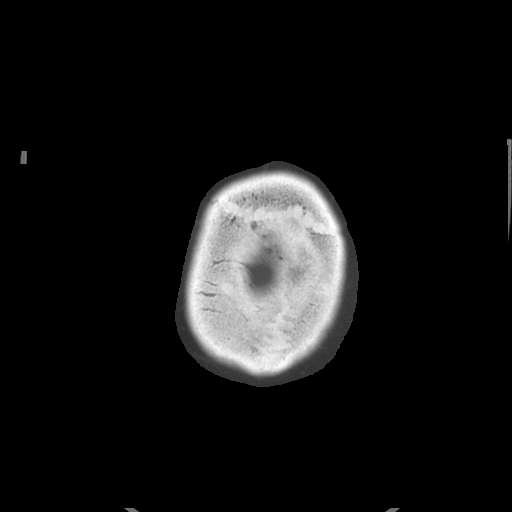

[Series 6: sag soft · sagittal · 0.39mm/px · 1 of 52 slices shown]
[im 26/52  soft-tissue]
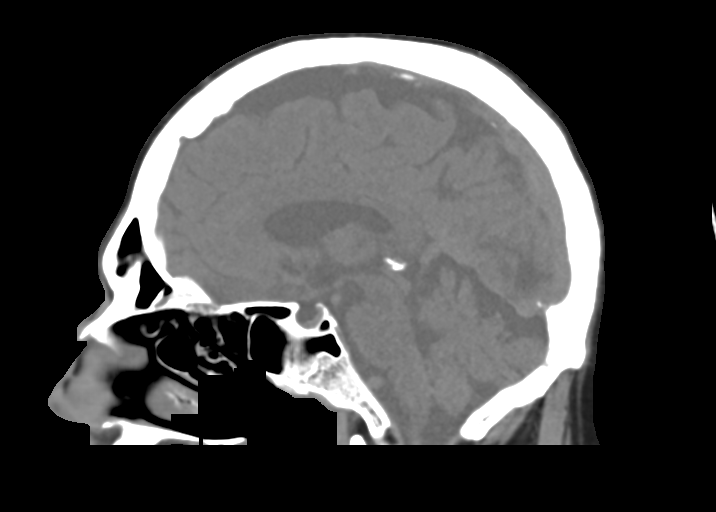

[11 of 33 positions shown; findings below may reference images not displayed]

FINDINGS: CTA NECK FINDINGS

Aortic arch: Visualized aortic arch normal in caliber with normal
branch pattern. Mild atheromatous change about the arch and in
visualized origins of the great vessels without significant
stenosis.

Right carotid system: Right CCA patent without stenosis. Eccentric
calcified plaque at the right carotid bulb with associated stenosis
of up to approximate 50% by NASCET criteria. Right ICA patent
distally without stenosis, dissection or occlusion.

Left carotid system: Left common and internal carotid arteries
patent without stenosis, dissection or occlusion. Prior left
endarterectomy without residual or recurrent stenosis.

Vertebral arteries: Both vertebral arteries arise from subclavian
arteries. No proximal subclavian artery stenosis. Left vertebral
artery dominant. Vertebral arteries patent without stenosis,
dissection or occlusion.

Skeleton: Multilevel degenerative spondylosis, better evaluated on
prior MRI. No discrete or worrisome osseous lesions.

Other neck: Postoperative changes from recent tumor resection with
lymph node dissection. Residual postoperative stranding within the
right greater the left neck. No visible recurrent mass or
adenopathy.

Upper chest: Emphysema.  No other acute finding.

Review of the MIP images confirms the above findings

CTA HEAD FINDINGS

Anterior circulation: Petrous segments widely patent. Atheromatous
change within the carotid siphons with associated mild multifocal
narrowing. A1 segments patent bilaterally. Left A1 hypoplastic.
Normal anterior communicating artery complex. Anterior cerebral
arteries patent to their distal aspects without stenosis. No M1
stenosis or occlusion. Normal MCA bifurcations. Distal MCA branches
well perfused and symmetric.

Posterior circulation: Both vertebral arteries patent to the
vertebrobasilar junction without stenosis. Both PICA origins patent
and normal. Basilar patent to its distal aspect without stenosis.
Superior cerebral arteries patent bilaterally. Left PCA widely
patent to its distal aspect. There is a focal severe stenosis
involving the proximal right P2 segment. Right PCA attenuated but
patent distally.

Venous sinuses: Grossly patent allowing for timing the contrast
bolus.

Anatomic variants: Hypoplastic left A1 segment.  No aneurysm.

Review of the MIP images confirms the above findings
IMPRESSION: 1. Negative CTA for large vessel occlusion.
2. Short-segment severe proximal right P2 stenosis. Right PCA
attenuated but patent distally. Finding is in keeping with the
previously identified subacute right thalamic infarct.
3. 50% atheromatous stenosis at the right carotid bulb.
4. Prior left carotid endarterectomy without residual or recurrent
stenosis.
5. No other hemodynamically significant or correctable stenosis
within the major arterial vasculature of the head and neck.
6. Postoperative changes from recent tumor resection and lymph node
dissection within the neck.
7. Emphysema ([9I]-[9I]).

## 2020-08-23 IMAGING — CT CT ANGIO NECK
2 of 7 series · 8 of 33 positions shown · IV contrast (APPLIED)
Comparison: MRI from [DATE].

CLINICAL DATA: Initial evaluation for neuro deficit, stroke
suspected.

EXAM:
CT ANGIOGRAPHY HEAD AND NECK
TECHNIQUE: Multidetector CT imaging of the head and neck was performed using
the standard protocol during bolus administration of intravenous
contrast. Multiplanar CT image reconstructions and MIPs were
obtained to evaluate the vascular anatomy. Carotid stenosis
measurements (when applicable) are obtained utilizing NASCET
criteria, using the distal internal carotid diameter as the
denominator.
CONTRAST:  100mL OMNIPAQUE IOHEXOL 350 MG/ML SOLN

[Series 5: cta neck/head · axial · 0.50mm/px · z∈[+1223,+1351]mm · 2 of 192 slices shown]
[im 64/192  soft-tissue]
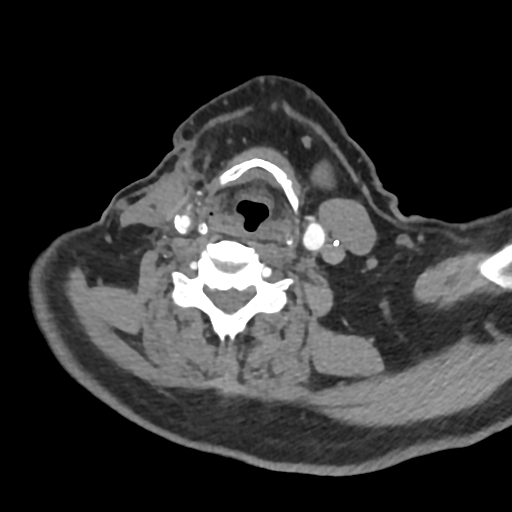
[im 128/192  soft-tissue]
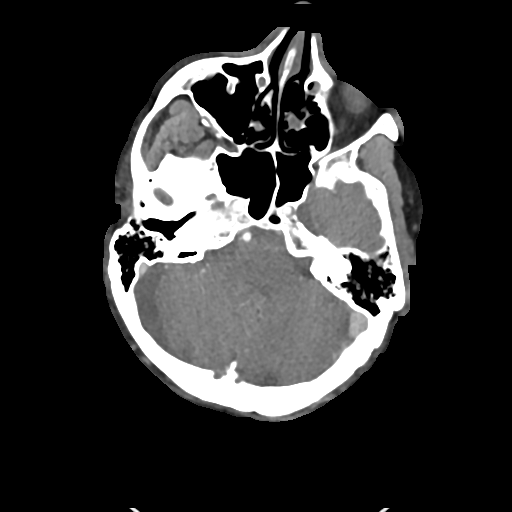

[Series 7: ax thins · axial · 0.46mm/px · z∈[+1150,+1424]mm · 6 of 384 slices shown]
[im 55/384  soft-tissue]
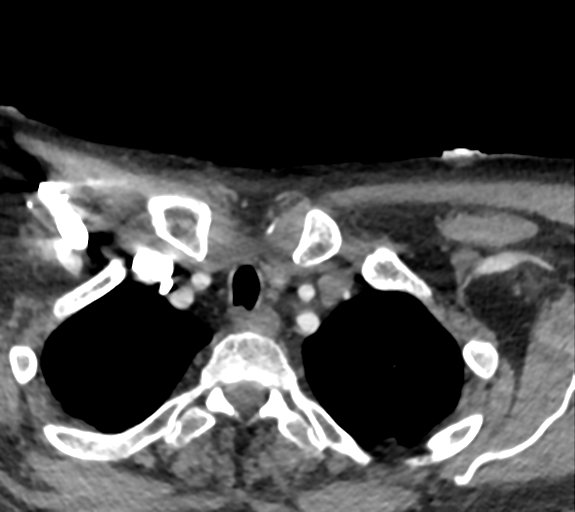
[im 110/384  bone]
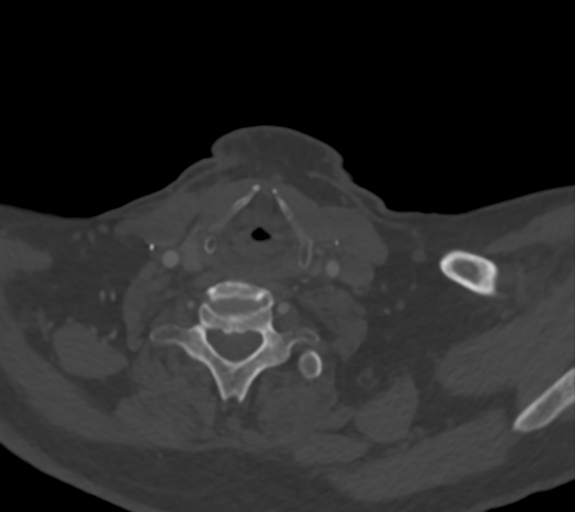
[im 165/384  soft-tissue]
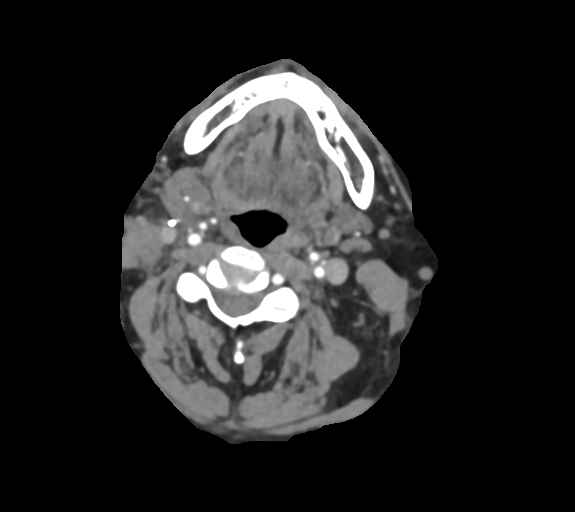
[im 219/384  bone]
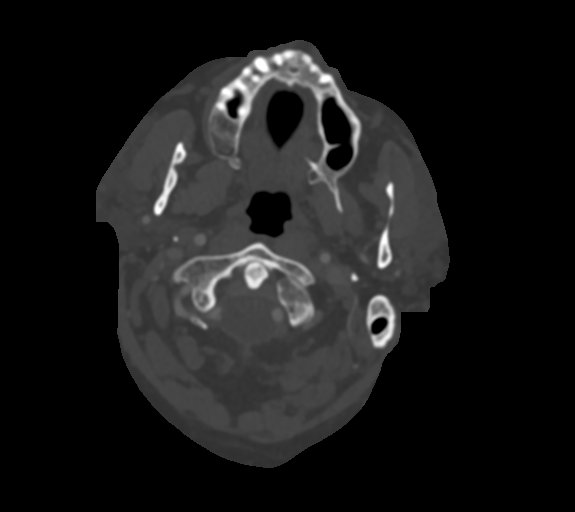
[im 274/384  soft-tissue]
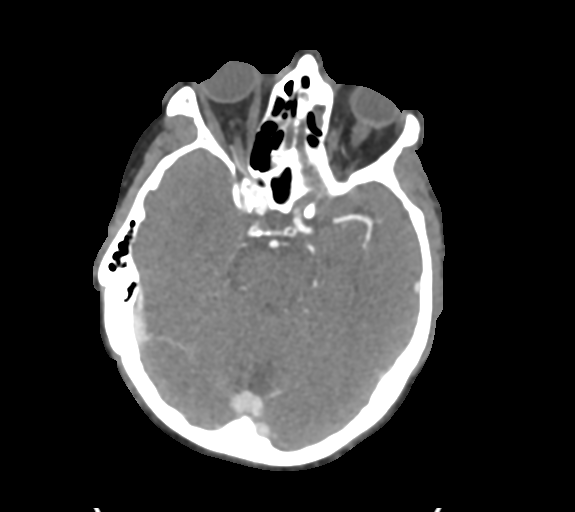
[im 329/384  bone]
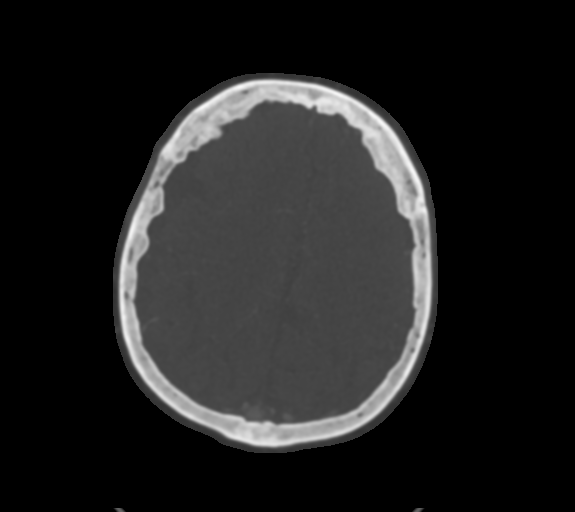

[8 of 33 positions shown; findings below may reference images not displayed]

FINDINGS: CTA NECK FINDINGS

Aortic arch: Visualized aortic arch normal in caliber with normal
branch pattern. Mild atheromatous change about the arch and in
visualized origins of the great vessels without significant
stenosis.

Right carotid system: Right CCA patent without stenosis. Eccentric
calcified plaque at the right carotid bulb with associated stenosis
of up to approximate 50% by NASCET criteria. Right ICA patent
distally without stenosis, dissection or occlusion.

Left carotid system: Left common and internal carotid arteries
patent without stenosis, dissection or occlusion. Prior left
endarterectomy without residual or recurrent stenosis.

Vertebral arteries: Both vertebral arteries arise from subclavian
arteries. No proximal subclavian artery stenosis. Left vertebral
artery dominant. Vertebral arteries patent without stenosis,
dissection or occlusion.

Skeleton: Multilevel degenerative spondylosis, better evaluated on
prior MRI. No discrete or worrisome osseous lesions.

Other neck: Postoperative changes from recent tumor resection with
lymph node dissection. Residual postoperative stranding within the
right greater the left neck. No visible recurrent mass or
adenopathy.

Upper chest: Emphysema.  No other acute finding.

Review of the MIP images confirms the above findings

CTA HEAD FINDINGS

Anterior circulation: Petrous segments widely patent. Atheromatous
change within the carotid siphons with associated mild multifocal
narrowing. A1 segments patent bilaterally. Left A1 hypoplastic.
Normal anterior communicating artery complex. Anterior cerebral
arteries patent to their distal aspects without stenosis. No M1
stenosis or occlusion. Normal MCA bifurcations. Distal MCA branches
well perfused and symmetric.

Posterior circulation: Both vertebral arteries patent to the
vertebrobasilar junction without stenosis. Both PICA origins patent
and normal. Basilar patent to its distal aspect without stenosis.
Superior cerebral arteries patent bilaterally. Left PCA widely
patent to its distal aspect. There is a focal severe stenosis
involving the proximal right P2 segment. Right PCA attenuated but
patent distally.

Venous sinuses: Grossly patent allowing for timing the contrast
bolus.

Anatomic variants: Hypoplastic left A1 segment.  No aneurysm.

Review of the MIP images confirms the above findings
IMPRESSION: 1. Negative CTA for large vessel occlusion.
2. Short-segment severe proximal right P2 stenosis. Right PCA
attenuated but patent distally. Finding is in keeping with the
previously identified subacute right thalamic infarct.
3. 50% atheromatous stenosis at the right carotid bulb.
4. Prior left carotid endarterectomy without residual or recurrent
stenosis.
5. No other hemodynamically significant or correctable stenosis
within the major arterial vasculature of the head and neck.
6. Postoperative changes from recent tumor resection and lymph node
dissection within the neck.
7. Emphysema ([9I]-[9I]).

## 2020-08-23 IMAGING — MR MR HEAD W/ CM
4 series · 13 of 48 positions shown · IV contrast (Yes GAD)
Comparison: Noncontrast MRI [DATE]

CLINICAL DATA: Abnormal MRI brain, history of head and neck cancer

EXAM:
MRI HEAD WITH CONTRAST
TECHNIQUE: Multiplanar, multiecho pulse sequences of the brain and surrounding
structures were obtained with intravenous contrast.
CONTRAST:  10mL GADAVIST GADOBUTROL 1 MMOL/ML IV SOLN

[Series 3: ax 3(person_name) pre · axial · non-contrast · 3.0mm · 0.94mm/px · z∈[-21,+98]mm · 3 of 56 slices shown]
[im 8/56]
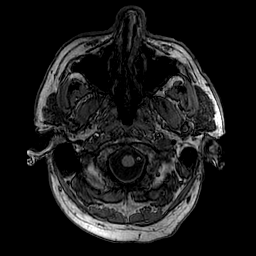
[im 30/56]
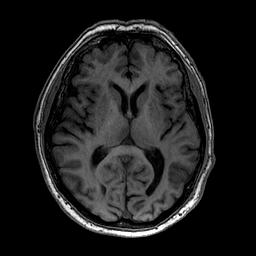
[im 48/56]
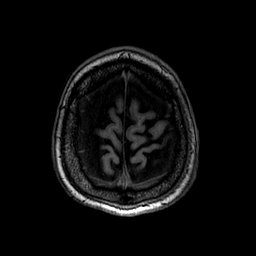

[Series 4: T2 post-contrast · coronal · 5.0mm · 0.20mm/px · 4 of 29 slices shown]
[im 1/29]
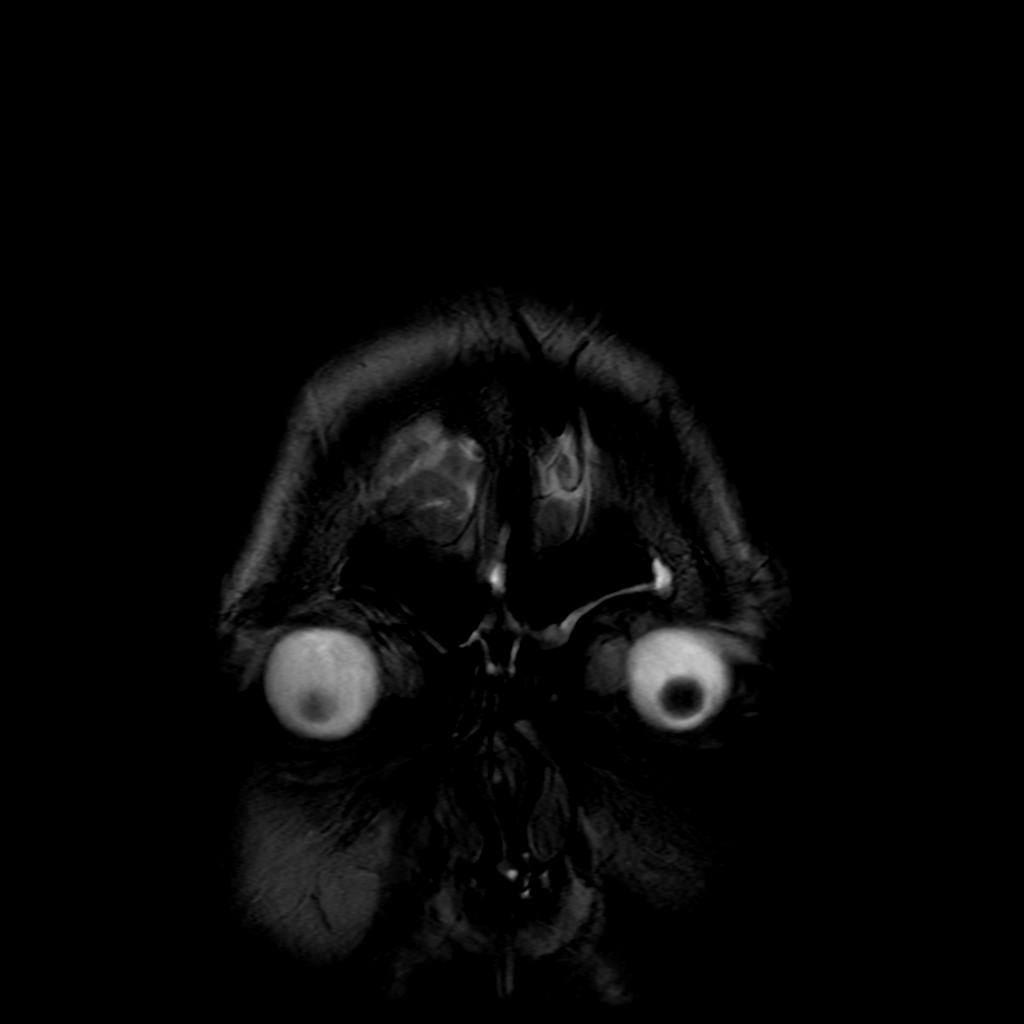
[im 5/29]
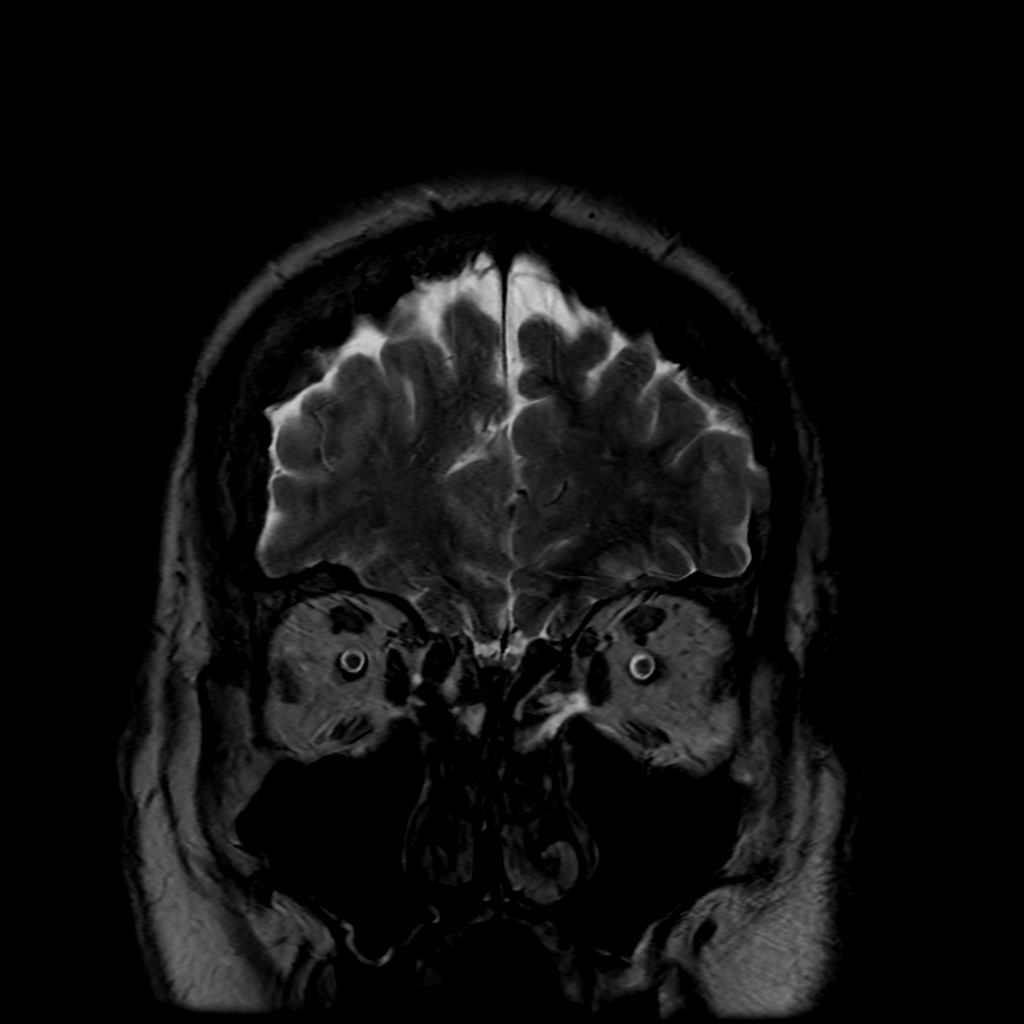
[im 17/29]
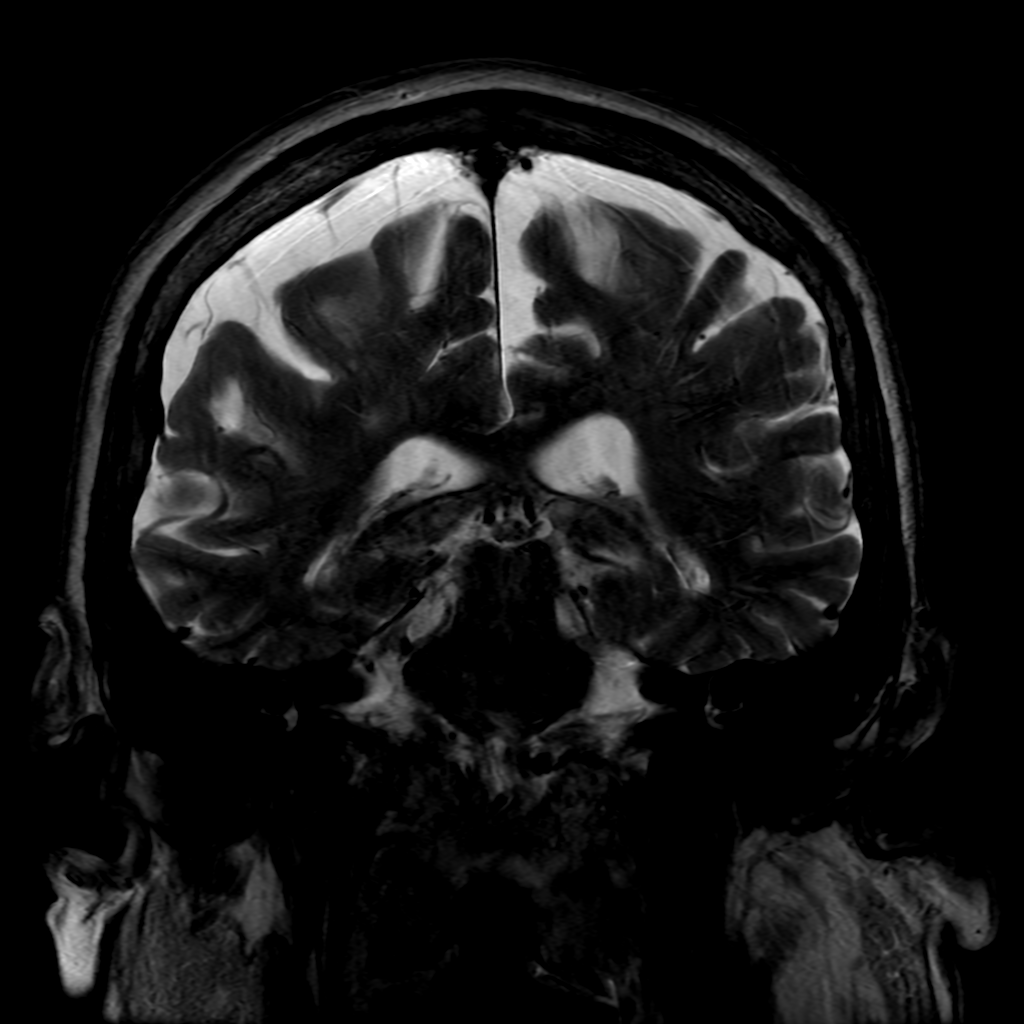
[im 25/29]
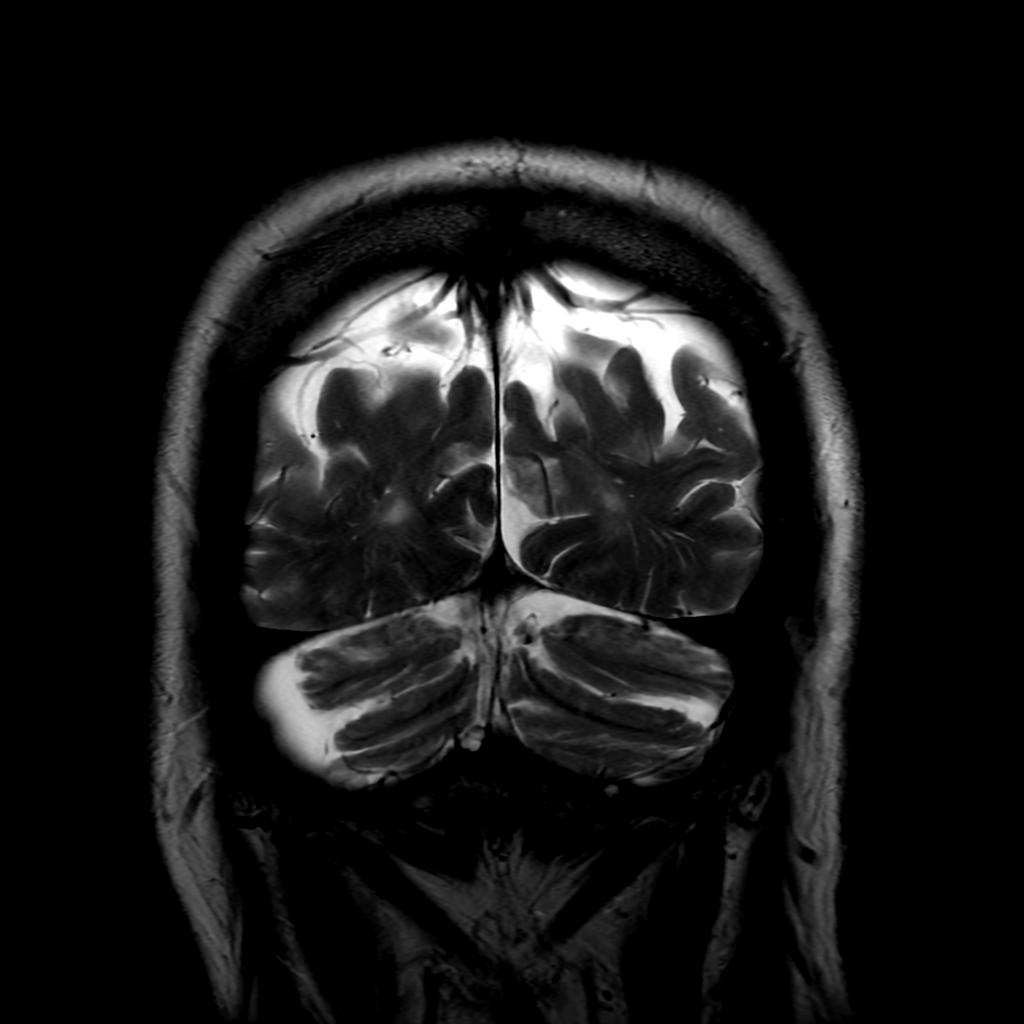

[Series 5: T1 · axial · 3.0mm · 0.94mm/px · z∈[-21,+98]mm · 3 of 56 slices shown (1 of 2)]
[im 8/56]
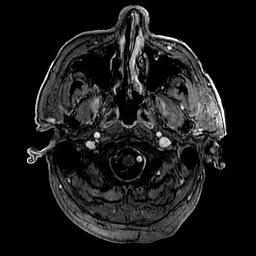
[im 30/56]
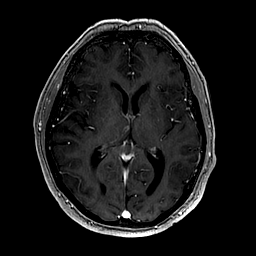
[im 48/56]
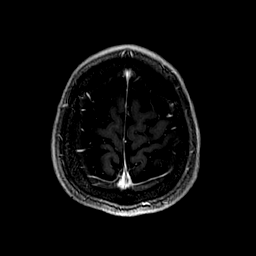

[Series 6: T1 · coronal · 5.0mm · 0.39mm/px · 3 of 29 slices shown (2 of 2)]
[im 5/29]
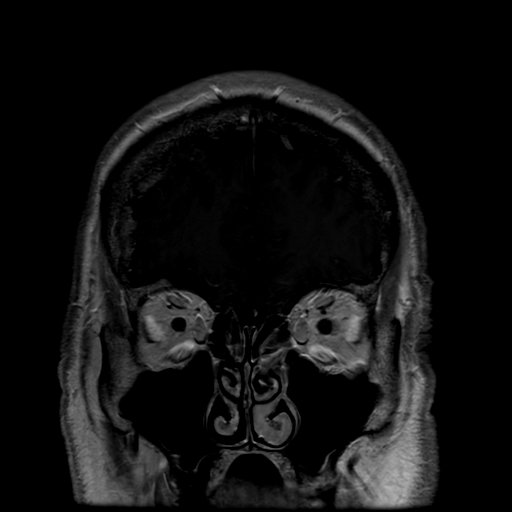
[im 17/29]
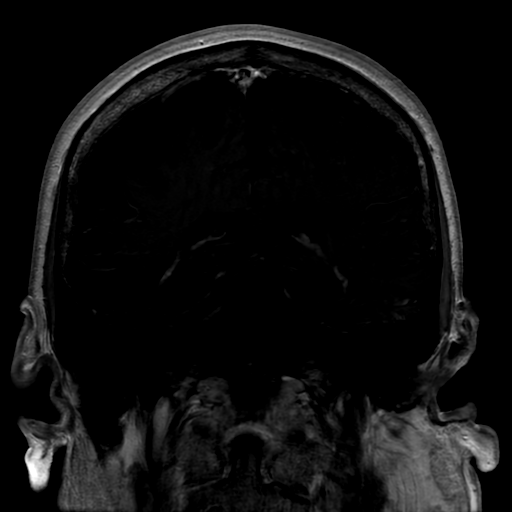
[im 25/29]
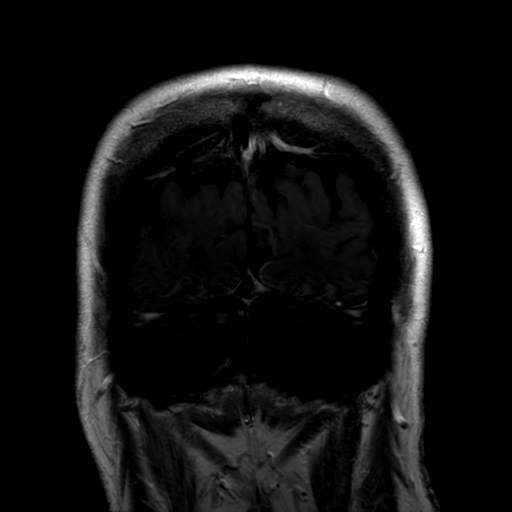

[13 of 48 positions shown; findings below may reference images not displayed]

FINDINGS: There is thin enhancement at the periphery of the medial right
thalamic signal abnormality. Otherwise, no abnormal enhancement.
IMPRESSION: Thin enhancement at the periphery of medial right thalamic signal
abnormality. This is unlikely to represent a metastasis but
follow-up study without and with contrast is recommended after
several weeks.

## 2020-08-23 MED ORDER — CLOPIDOGREL BISULFATE 75 MG PO TABS: 75.0000 mg | ORAL_TABLET | Freq: Every day | ORAL | Status: DC

## 2020-08-23 MED ORDER — SENNOSIDES-DOCUSATE SODIUM 8.6-50 MG PO TABS: 1.0000 | ORAL_TABLET | Freq: Every evening | ORAL | Status: DC | PRN

## 2020-08-23 MED ORDER — ATORVASTATIN CALCIUM 40 MG PO TABS: 40.0000 mg | ORAL_TABLET | Freq: Every day | ORAL | 2 refills | Status: DC

## 2020-08-23 MED ORDER — ATORVASTATIN CALCIUM 40 MG PO TABS
80.0000 mg | ORAL_TABLET | Freq: Every day | ORAL | Status: DC
  Administered 2020-08-23: 80 mg via ORAL
  Filled 2020-08-23 (×2): qty 2

## 2020-08-23 MED ORDER — CLOPIDOGREL BISULFATE 75 MG PO TABS
75.0000 mg | ORAL_TABLET | Freq: Every day | ORAL | 2 refills | Status: DC
Start: 2020-08-24 — End: 2020-10-02

## 2020-08-23 MED ORDER — NICOTINE 14 MG/24HR TD PT24
14.0000 mg | MEDICATED_PATCH | Freq: Every day | TRANSDERMAL | Status: DC
  Filled 2020-08-23: qty 1

## 2020-08-23 MED ORDER — ASPIRIN 325 MG PO TBEC: 325.0000 mg | DELAYED_RELEASE_TABLET | Freq: Every day | ORAL | 2 refills | Status: DC

## 2020-08-23 MED ORDER — SODIUM CHLORIDE 0.9 % IV SOLN: INTRAVENOUS | Status: DC

## 2020-08-23 MED ORDER — CLOPIDOGREL BISULFATE 75 MG PO TABS
75.0000 mg | ORAL_TABLET | Freq: Every day | ORAL | Status: DC
  Administered 2020-08-23: 75 mg via ORAL
  Filled 2020-08-23: qty 1

## 2020-08-23 MED ORDER — ACETAMINOPHEN 650 MG RE SUPP: 650.0000 mg | RECTAL | Status: DC | PRN

## 2020-08-23 MED ORDER — ACETAMINOPHEN 160 MG/5ML PO SOLN: 650.0000 mg | ORAL | Status: DC | PRN

## 2020-08-23 MED ORDER — ASPIRIN EC 325 MG PO TBEC
325.0000 mg | DELAYED_RELEASE_TABLET | Freq: Every day | ORAL | Status: DC
  Administered 2020-08-23: 325 mg via ORAL
  Filled 2020-08-23: qty 1

## 2020-08-23 MED ORDER — ATORVASTATIN CALCIUM 40 MG PO TABS
40.0000 mg | ORAL_TABLET | Freq: Every day | ORAL | Status: DC
  Administered 2020-08-23: 40 mg via ORAL

## 2020-08-23 MED ORDER — HYDRALAZINE HCL 20 MG/ML IJ SOLN: 10.0000 mg | Freq: Four times a day (QID) | INTRAMUSCULAR | Status: DC | PRN

## 2020-08-23 MED ORDER — IOHEXOL 350 MG/ML SOLN
100.0000 mL | Freq: Once | INTRAVENOUS | Status: AC | PRN
  Administered 2020-08-23: 100 mL via INTRAVENOUS

## 2020-08-23 MED ORDER — ACETAMINOPHEN 325 MG PO TABS: 650.0000 mg | ORAL_TABLET | ORAL | Status: DC | PRN

## 2020-08-23 MED ORDER — ENOXAPARIN SODIUM 40 MG/0.4ML IJ SOSY
40.0000 mg | PREFILLED_SYRINGE | INTRAMUSCULAR | Status: DC
  Administered 2020-08-23: 40 mg via SUBCUTANEOUS
  Filled 2020-08-23: qty 0.4

## 2020-08-23 MED ORDER — STROKE: EARLY STAGES OF RECOVERY BOOK
Freq: Once | Status: AC
  Filled 2020-08-23: qty 1

## 2020-08-23 MED ORDER — GADOBUTROL 1 MMOL/ML IV SOLN
10.0000 mL | Freq: Once | INTRAVENOUS | Status: AC | PRN
  Administered 2020-08-23: 10 mL via INTRAVENOUS

## 2020-08-23 MED ORDER — ASPIRIN EC 81 MG PO TBEC
81.0000 mg | DELAYED_RELEASE_TABLET | Freq: Every day | ORAL | Status: DC
  Filled 2020-08-23: qty 1

## 2020-08-23 NOTE — Progress Notes (Addendum)
STROKE TEAM PROGRESS NOTE   ATTENDING NOTE: I reviewed above note and agree with the assessment and plan. Pt was seen and examined.   70 year old male with history of hypertension, PVD, left CEA 2018, smoker, throat cancer status post surgery in 05/2020 admitted for left arm numbness tingling, waxing and waning.  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  MRI showed subacute right thalamic infarct.  CTA head and neck right P2 severe stenosis, right ICA 50% stenosis.  MRI with contrast showed thinning enhancement at the periphery of medial right thalamus, more likely due to subacute stroke, not consistent with metastasis.  EF 50 to 55%, A1c 4.9, LDL 139.  Creatinine 1.10.  EEG normal.  On exam, patient neurology intact, sensory symmetrical throughout.  Etiology for patient stroke likely due to large vessel disease given severe right P2 stenosis.  Recommend aspirin 325 and Plavix 75 DAPT for 3 months and then aspirin alone.  Started on Lipitor 40, continue on discharge.  Smoking cessation education provided.  Patient has oncology follow-up at the end of this month.  PT/OT no recs.   For detailed assessment and plan, please refer to above as I have made changes wherever appropriate.   Neurology will sign off. Please call with questions. Pt will follow up with stroke clinic NP at [facility] in about 4 weeks. Thanks for the consult.   Rosalin Hawking, MD PhD Stroke Neurology 08/23/2020 7:58 PM    SUBJECTIVE (INTERVAL HISTORY) His wife is at the bedside.  Overall his condition is stable.  Very pleasant patient alert and awake, no longer having any focal neurological deficits, reports to be at baseline.  Reviewed imaging and lab findings with patient and his wife.   OBJECTIVE Temp:  [98 F (36.7 C)-98.2 F (36.8 C)] 98.2 F (36.8 C) (08/12 1241) Pulse Rate:  [60-74] 67 (08/12 1241) Cardiac Rhythm: Normal sinus rhythm (08/12 1339) Resp:  [16-27] 19 (08/12 1241) BP:  (157-202)/(78-107) 157/92 (08/12 1241) SpO2:  [95 %-98 %] 96 % (08/12 1241)  No results for input(s): GLUCAP in the last 168 hours. Recent Labs  Lab 08/22/20 0038 08/22/20 0059  NA 141 142  K 4.0 4.6  CL 107 109  CO2 26  --   GLUCOSE 118* 111*  BUN 17 22  CREATININE 1.09 1.10  CALCIUM 9.1  --    Recent Labs  Lab 08/22/20 0038  AST 23  ALT 13  ALKPHOS 56  BILITOT 0.7  PROT 6.5  ALBUMIN 3.7   Recent Labs  Lab 08/22/20 0038 08/22/20 0059  WBC 6.3  --   NEUTROABS 2.9  --   HGB 13.0 12.6*  HCT 39.2 37.0*  MCV 93.3  --   PLT 191  --    No results for input(s): CKTOTAL, CKMB, CKMBINDEX, TROPONINI in the last 168 hours. Recent Labs    08/22/20 0038  LABPROT 13.5  INR 1.0   Recent Labs    08/22/20 0320  COLORURINE YELLOW  LABSPEC 1.025  PHURINE 5.0  GLUCOSEU NEGATIVE  HGBUR NEGATIVE  BILIRUBINUR NEGATIVE  KETONESUR NEGATIVE  PROTEINUR NEGATIVE  NITRITE NEGATIVE  LEUKOCYTESUR NEGATIVE       Component Value Date/Time   CHOL 202 (H) 08/23/2020 0553   TRIG 119 08/23/2020 0553   HDL 39 (L) 08/23/2020 0553   CHOLHDL 5.2 08/23/2020 0553   VLDL 24 08/23/2020 0553   LDLCALC 139 (H) 08/23/2020 0553   Lab Results  Component Value Date   HGBA1C 4.9 08/23/2020  No results found for: LABOPIA, COCAINSCRNUR, LABBENZ, AMPHETMU, THCU, LABBARB  No results for input(s): ETH in the last 168 hours.  I have personally reviewed the radiological images below and agree with the radiology interpretations.  CT ANGIO HEAD W OR WO CONTRAST  Result Date: 08/23/2020 CLINICAL DATA:  Initial evaluation for neuro deficit, stroke suspected. EXAM: CT ANGIOGRAPHY HEAD AND NECK TECHNIQUE: Multidetector CT imaging of the head and neck was performed using the standard protocol during bolus administration of intravenous contrast. Multiplanar CT image reconstructions and MIPs were obtained to evaluate the vascular anatomy. Carotid stenosis measurements (when applicable) are obtained  utilizing NASCET criteria, using the distal internal carotid diameter as the denominator. CONTRAST:  170m OMNIPAQUE IOHEXOL 350 MG/ML SOLN COMPARISON:  MRI from 08/22/2020. FINDINGS: CTA NECK FINDINGS Aortic arch: Visualized aortic arch normal in caliber with normal branch pattern. Mild atheromatous change about the arch and in visualized origins of the great vessels without significant stenosis. Right carotid system: Right CCA patent without stenosis. Eccentric calcified plaque at the right carotid bulb with associated stenosis of up to approximate 50% by NASCET criteria. Right ICA patent distally without stenosis, dissection or occlusion. Left carotid system: Left common and internal carotid arteries patent without stenosis, dissection or occlusion. Prior left endarterectomy without residual or recurrent stenosis. Vertebral arteries: Both vertebral arteries arise from subclavian arteries. No proximal subclavian artery stenosis. Left vertebral artery dominant. Vertebral arteries patent without stenosis, dissection or occlusion. Skeleton: Multilevel degenerative spondylosis, better evaluated on prior MRI. No discrete or worrisome osseous lesions. Other neck: Postoperative changes from recent tumor resection with lymph node dissection. Residual postoperative stranding within the right greater the left neck. No visible recurrent mass or adenopathy. Upper chest: Emphysema.  No other acute finding. Review of the MIP images confirms the above findings CTA HEAD FINDINGS Anterior circulation: Petrous segments widely patent. Atheromatous change within the carotid siphons with associated mild multifocal narrowing. A1 segments patent bilaterally. Left A1 hypoplastic. Normal anterior communicating artery complex. Anterior cerebral arteries patent to their distal aspects without stenosis. No M1 stenosis or occlusion. Normal MCA bifurcations. Distal MCA branches well perfused and symmetric. Posterior circulation: Both vertebral  arteries patent to the vertebrobasilar junction without stenosis. Both PICA origins patent and normal. Basilar patent to its distal aspect without stenosis. Superior cerebral arteries patent bilaterally. Left PCA widely patent to its distal aspect. There is a focal severe stenosis involving the proximal right P2 segment. Right PCA attenuated but patent distally. Venous sinuses: Grossly patent allowing for timing the contrast bolus. Anatomic variants: Hypoplastic left A1 segment.  No aneurysm. Review of the MIP images confirms the above findings IMPRESSION: 1. Negative CTA for large vessel occlusion. 2. Short-segment severe proximal right P2 stenosis. Right PCA attenuated but patent distally. Finding is in keeping with the previously identified subacute right thalamic infarct. 3. 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 4. Prior left carotid endarterectomy without residual or recurrent stenosis. 5. No other hemodynamically significant or correctable stenosis within the major arterial vasculature of the head and neck. 6. Postoperative changes from recent tumor resection and lymph node dissection within the neck. 7. Emphysema (ICD10-J43.9). Electronically Signed   By: BJeannine BogaM.D.   On: 08/23/2020 02:52   CT HEAD WO CONTRAST (5MM)  Result Date: 08/22/2020 CLINICAL DATA:  Initial evaluation for neuro deficit, stroke suspected, intermittent left upper extremity and facial numbness. EXAM: CT HEAD WITHOUT CONTRAST TECHNIQUE: Contiguous axial images were obtained from the base of the skull through the  vertex without intravenous contrast. COMPARISON:  Prior study from 07/18/2016. FINDINGS: Brain: Parietal predominant cerebral atrophy, progressed as compared to prior exam from 2018. Probable mild chronic microvascular ischemic disease noted involving the periventricular and deep white matter. Small remote lacunar infarct noted involving the right caudate body. No acute intracranial hemorrhage. No  visible acute large vessel territory infarct. No mass lesion, mass effect, or midline shift. No hydrocephalus or extra-axial fluid collection. Vascular: No hyperdense vessel. Scattered vascular calcifications noted within the carotid siphons. Skull: No scalp soft tissue abnormality.  Calvarium intact. Sinuses/Orbits: Globes and orbital soft tissues demonstrate no acute finding. Scattered mucoperiosteal thickening noted throughout the paranasal sinuses. No air-fluid levels to suggest acute sinusitis. Mastoid air cells and middle ear cavities are well pneumatized and free of fluid. Other: None. IMPRESSION: 1. No acute intracranial abnormality. 2. Parietal predominant cerebral atrophy with mild chronic small vessel ischemic disease, progressed as compared to prior exam from 2018. 3. 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 Electronically Signed   By: Jeannine Boga M.D.   On: 08/22/2020 04:35   CT ANGIO NECK W OR WO CONTRAST  Result Date: 08/23/2020 CLINICAL DATA:  Initial evaluation for neuro deficit, stroke suspected. EXAM: CT ANGIOGRAPHY HEAD AND NECK TECHNIQUE: Multidetector CT imaging of the head and neck was performed using the standard protocol during bolus administration of intravenous contrast. Multiplanar CT image reconstructions and MIPs were obtained to evaluate the vascular anatomy. Carotid stenosis measurements (when applicable) are obtained utilizing NASCET criteria, using the distal internal carotid diameter as the denominator. CONTRAST:  138m OMNIPAQUE IOHEXOL 350 MG/ML SOLN COMPARISON:  MRI from 08/22/2020. FINDINGS: CTA NECK FINDINGS Aortic arch: Visualized aortic arch normal in caliber with normal branch pattern. Mild atheromatous change about the arch and in visualized origins of the great vessels without significant stenosis. Right carotid system: Right CCA patent without stenosis. Eccentric calcified plaque at the right carotid bulb with associated stenosis of up to  approximate 50% by NASCET criteria. Right ICA patent distally without stenosis, dissection or occlusion. Left carotid system: Left common and internal carotid arteries patent without stenosis, dissection or occlusion. Prior left endarterectomy without residual or recurrent stenosis. Vertebral arteries: Both vertebral arteries arise from subclavian arteries. No proximal subclavian artery stenosis. Left vertebral artery dominant. Vertebral arteries patent without stenosis, dissection or occlusion. Skeleton: Multilevel degenerative spondylosis, better evaluated on prior MRI. No discrete or worrisome osseous lesions. Other neck: Postoperative changes from recent tumor resection with lymph node dissection. Residual postoperative stranding within the right greater the left neck. No visible recurrent mass or adenopathy. Upper chest: Emphysema.  No other acute finding. Review of the MIP images confirms the above findings CTA HEAD FINDINGS Anterior circulation: Petrous segments widely patent. Atheromatous change within the carotid siphons with associated mild multifocal narrowing. A1 segments patent bilaterally. Left A1 hypoplastic. Normal anterior communicating artery complex. Anterior cerebral arteries patent to their distal aspects without stenosis. No M1 stenosis or occlusion. Normal MCA bifurcations. Distal MCA branches well perfused and symmetric. Posterior circulation: Both vertebral arteries patent to the vertebrobasilar junction without stenosis. Both PICA origins patent and normal. Basilar patent to its distal aspect without stenosis. Superior cerebral arteries patent bilaterally. Left PCA widely patent to its distal aspect. There is a focal severe stenosis involving the proximal right P2 segment. Right PCA attenuated but patent distally. Venous sinuses: Grossly patent allowing for timing the contrast bolus. Anatomic variants: Hypoplastic left A1 segment.  No aneurysm. Review of the MIP images confirms the above  findings IMPRESSION: 1. Negative CTA for large vessel occlusion. 2. Short-segment severe proximal right P2 stenosis. Right PCA attenuated but patent distally. Finding is in keeping with the previously identified subacute right thalamic infarct. 3. 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 4. Prior left carotid endarterectomy without residual or recurrent stenosis. 5. No other hemodynamically significant or correctable stenosis within the major arterial vasculature of the head and neck. 6. Postoperative changes from recent tumor resection and lymph node dissection within the neck. 7. Emphysema (ICD10-J43.9). Electronically Signed   By: Jeannine Boga M.D.   On: 08/23/2020 02:52   CT Cervical Spine Wo Contrast  Result Date: 08/22/2020 CLINICAL DATA:  Initial evaluation for intermittent left upper extremity and facial numbness for 4 days. EXAM: CT CERVICAL SPINE WITHOUT CONTRAST TECHNIQUE: Multidetector CT imaging of the cervical spine was performed without intravenous contrast. Multiplanar CT image reconstructions were also generated. COMPARISON:  Prior CT of the neck from 04/22/2020. FINDINGS: Alignment: Please note that this examination was performed during a downtime and is somewhat technically limited as the scanner abruptly froze/stopped during image acquisition. Axial images of the cervical spine in bone and soft tissue window algorithm are provided, however, the coronal and sagittal reformatted images were constructed through post processing, and are of markedly low resolution and fairly limited. Vertebral bodies normally aligned with preservation of the normal cervical lordosis. No significant listhesis. Skull base and vertebrae: Skull base grossly intact. Normal C1-2 articulations are preserved. Dens grossly intact. Vertebral body height maintained. No visible fracture on this technically limited exam. No discrete lytic or blastic osseous lesions. Soft tissues and spinal canal:  Postoperative changes from recent tumor resection with nodal dissection seen within the oropharynx and neck. Mild hazy stranding within the right greater than left neck favored to be postoperative in nature. No significant prevertebral edema. Atherosclerotic calcification noted about the right carotid bifurcation. Disc levels: C2-3: Negative interspace. Left-sided facet hypertrophy. No stenosis. C3-4: Disc bulge with bilateral uncovertebral hypertrophy. Superimposed small central disc protrusion. No significant spinal stenosis. Mild to moderate right greater than left C4 foraminal narrowing. C4-5: Central disc protrusion indents the ventral thecal sac, contacting the ventral cord (series 9, image 66). Mild spinal stenosis with minimal cord flattening. Superimposed uncovertebral and facet hypertrophy with mild to moderate bilateral C5 foraminal narrowing. C5-6: Degenerative intervertebral disc space narrowing with diffuse disc osteophyte complex, eccentric to the right. Flattening of the ventral thecal sac with resultant mild spinal stenosis. Severe right with moderate left C6 foraminal narrowing. C6-7: Degenerative intervertebral disc space narrowing with diffuse disc osteophyte complex. Posterior component indents and partially faces the ventral thecal sac with at least mild spinal stenosis. Moderate left with mild right C7 foraminal narrowing. C7-T1: Negative interspace. Mild facet hypertrophy. No significant stenosis. Upper chest: Visualized upper chest demonstrates no acute finding. Emphysematous changes noted within the visualized lungs. Other: None. IMPRESSION: 1. Limited exam due to technical difficulty during image acquisition as described above. 2. No definite acute osseous abnormality within the cervical spine. 3. Central disc protrusion at C4-5 with resultant mild spinal stenosis with minimal cord flattening. 4. Disc osteophyte complexes at C5-6 and C6-7 with resultant mild spinal stenosis. Severe right  with moderate left C6 foraminal narrowing, with moderate left C7 foraminal stenosis. 5. Postoperative changes from recent tumor resection with nodal dissection within the oropharynx and neck. No visible adverse features. 6. Emphysema (ICD10-J43.9). Electronically Signed   By: Jeannine Boga M.D.   On: 08/22/2020 05:01   MR BRAIN WO  CONTRAST  Result Date: 08/22/2020 CLINICAL DATA:  Initial evaluation for left facial and upper extremity numbness for 4 days. EXAM: MRI HEAD WITHOUT CONTRAST TECHNIQUE: Multiplanar, multiecho pulse sequences of the brain and surrounding structures were obtained without intravenous contrast. COMPARISON:  CT from earlier the same day. FINDINGS: Brain: Diffuse prominence of the CSF containing spaces compatible with moderately advanced cerebral and cerebellar atrophy, more pronounced on the right. Patchy T2/FLAIR hyperintensity within the periventricular deep white matter both cerebral hemispheres most consistent with chronic small vessel ischemic disease, moderately advanced in nature. Small area of encephalomalacia and gliosis involving the cortical and subcortical left parietal lobe consistent with a chronic ischemic infarct (series 11, image 19). Associated chronic hemosiderin staining present within this region. There is a well demarcated somewhat elongated focus of diffusion signal abnormality involving medial right thalamus measuring 1.9 cm (series 5, image 78). Associated T2/FLAIR signal hyperintensity without discernible ADC correlate. Finding is favored to reflect an evolving subacute ischemic infarct. No significant mass effect or associated hemorrhage. No other diffusion abnormality to suggest acute or subacute ischemia. Gray-white matter differentiation otherwise maintained. No other areas of acute or chronic hemorrhage. No other mass lesion, midline shift or mass effect. No hydrocephalus or extra-axial fluid collection. Partially empty sella noted. Vascular: Major  intracranial vascular flow voids are maintained. Skull and upper cervical spine: Craniocervical junction normal. Bone marrow signal intensity within normal limits. No scalp soft tissue abnormality. Sinuses/Orbits: Globes and orbital soft tissues within normal limits. Scattered mucosal thickening noted throughout the paranasal sinuses. No significant mastoid effusion. Inner ear structures grossly normal. Other: None. IMPRESSION: 1. 1.9 cm focus of diffusion signal abnormality involving the medial right thalamus, favored to reflect an evolving subacute ischemic infarct. No associated hemorrhage or mass effect. A short interval follow-up MRI to ensure these changes resolve is recommended, particularly given the patient history of malignancy, as an underlying neoplasm could conceivably have this appearance as well. 2. No other acute intracranial abnormality. 3. Moderately advanced cerebral and cerebellar atrophy with chronic small vessel ischemic disease. Superimposed small remote left parietal infarct. Electronically Signed   By: Jeannine Boga M.D.   On: 08/22/2020 19:52   MR BRAIN W CONTRAST  Result Date: 08/23/2020 CLINICAL DATA:  Abnormal MRI brain, history of head and neck cancer EXAM: MRI HEAD WITH CONTRAST TECHNIQUE: Multiplanar, multiecho pulse sequences of the brain and surrounding structures were obtained with intravenous contrast. CONTRAST:  38m GADAVIST GADOBUTROL 1 MMOL/ML IV SOLN COMPARISON:  Noncontrast MRI 08/22/2020 FINDINGS: There is thin enhancement at the periphery of the medial right thalamic signal abnormality. Otherwise, no abnormal enhancement. IMPRESSION: Thin enhancement at the periphery of medial right thalamic signal abnormality. This is unlikely to represent a metastasis but follow-up study without and with contrast is recommended after several weeks. Electronically Signed   By: PMacy MisM.D.   On: 08/23/2020 09:30   MR Cervical Spine Wo Contrast  Result Date:  08/22/2020 CLINICAL DATA:  Initial evaluation for left facial and upper extremity numbness for 4 days. EXAM: MRI CERVICAL SPINE WITHOUT CONTRAST TECHNIQUE: Multiplanar, multisequence MR imaging of the cervical spine was performed. No intravenous contrast was administered. COMPARISON:  CT from earlier the same day. FINDINGS: Alignment: Straightening of the normal cervical lordosis. Trace anterolisthesis of C5 on C6. Vertebrae: Vertebral body height maintained without acute or chronic fracture. Bone marrow signal intensity within normal limits. No discrete or worrisome osseous lesions. No abnormal marrow edema. Cord: Normal signal and morphology. Posterior Fossa, vertebral arteries, paraspinal  tissues: Craniocervical junction normal. Paraspinous and prevertebral soft tissues demonstrate no acute finding. Normal flow voids seen within the carotid and vertebral arteries bilaterally. Disc levels: C2-C3: Normal interspace. Mild left greater than right facet hypertrophy. No canal or foraminal stenosis. C3-C4: Mild disc bulge with uncovertebral hypertrophy. Superimposed small central disc protrusion indents the ventral thecal sac (series 10, image 17). No significant spinal stenosis or cord deformity. Moderate bilateral C4 foraminal stenosis. C4-C5: Central/right paracentral disc protrusion indents the ventral thecal sac, contacting and mildly flattening the ventral cord. No cord signal changes. Mild spinal stenosis. Mild to moderate bilateral C5 foraminal narrowing. C5-C6: Trace anterolisthesis. Degenerative intervertebral disc space narrowing with diffuse disc osteophyte complex, eccentric to the right. Broad posterior component flattens and effaces the ventral thecal sac with resultant mild to moderate spinal stenosis. Minimal cord flattening without cord signal changes. Severe right with moderate left C6 foraminal narrowing. C6-C7: Degenerative intervertebral disc space narrowing with diffuse disc osteophyte complex,  slightly eccentric to the right. Posterior component flattens and partially effaces the ventral thecal sac, greater on the right. Mild spinal stenosis without frank cord impingement. Moderate left C7 foraminal narrowing. Right neural foramina remains patent. C7-T1: Negative interspace.  Mild facet hypertrophy.  No stenosis. Visualized upper thoracic spine demonstrates no significant finding. IMPRESSION: 1. No acute abnormality within the cervical spine. 2. 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 3. Multifactorial degenerative changes with resultant multilevel foraminal narrowing as above. Notable findings include moderate bilateral C4 foraminal stenosis, severe right with moderate left C6 foraminal narrowing, with moderate left C7 foraminal stenosis. Electronically Signed   By: Jeannine Boga M.D.   On: 08/22/2020 20:03   EEG adult  Result Date: 08/23/2020 Lora Havens, MD     08/23/2020 10:24 AM Patient Name: Samuel Serrano MRN: [id number] Epilepsy Attending: Lora Havens Referring Physician/Provider: Dr Lesleigh Noe Date: 08/23/2020 Duration: 25.51 mins Patient history: 70 year old male who presented with episode of tingling/numbness in left upper extremity. EEG to evaluate for seizures. Level of alertness: Awake, asleep AEDs during EEG study: None Technical aspects: This EEG study was done with scalp electrodes positioned according to the 10-20 International system of electrode placement. Electrical activity was acquired at a sampling rate of '500Hz'$  and reviewed with a high frequency filter of '70Hz'$  and a low frequency filter of '1Hz'$ . EEG data were recorded continuously and digitally stored. Description: The posterior dominant rhythm consists of 8-9 Hz activity of moderate voltage (25-35 uV) seen predominantly in posterior head regions, symmetric and reactive to eye opening and eye closing. Sleep was characterized by vertex waves, sleep  spindles (12 to 14 Hz), maximal frontocentral region.  Hyperventilation and photic stimulation were not performed.   IMPRESSION: This study is within normal limits. No seizures or epileptiform discharges were seen throughout the recording. Priyanka O Yadav    TTE pending   PHYSICAL EXAM  Temp:  [98 F (36.7 C)-98.2 F (36.8 C)] 98.2 F (36.8 C) (08/12 1241) Pulse Rate:  [60-74] 67 (08/12 1241) Resp:  [16-27] 19 (08/12 1241) BP: (157-202)/(78-107) 157/92 (08/12 1241) SpO2:  [95 %-98 %] 96 % (08/12 1241)  General - Well nourished, well developed, in no apparent distress.  Ophthalmologic - fundi not visualized due to noncooperation.  Cardiovascular - Regular rhythm and rate.  Mental Status -  Level of arousal and orientation to time, place, and person were intact. Language including expression, naming, repetition, comprehension was assessed and found intact. Attention span and  concentration were normal. Recent and remote memory were intact. Fund of Knowledge was assessed and was intact.  Cranial Nerves II - XII - II - Visual field intact OU III, IV, VI - Extraocular movements intact. V - Facial sensation intact bilaterally. VII - Facial movement intact bilaterally. VIII - Hearing & vestibular intact bilaterally. X - Palate elevates symmetrically. XI - Chin turning & shoulder shrug intact bilaterally. XII - Tongue protrusion intact.  Motor Strength - The patient's strength was normal in all extremities and pronator drift was absent.  Bulk was normal and fasciculations were absent.   Motor Tone - Muscle tone was assessed at the neck and appendages and was normal.  Reflexes - The patient's reflexes were symmetrical in all extremities and he had no pathological reflexes.  Sensory - Light touch, temperature/pinprick were assessed and were symmetrical.    Coordination - The patient had normal movements in the hands and feet with no ataxia or dysmetria.  Tremor was absent.  Gait  and Station - deferred.   ASSESSMENT/PLAN   Samuel Serrano is a 70 y.o. male with history of hypertension, PVD, left CEA (2018), cancer of throat status post surgery, ongoing tobacco use 1 PPD (plans to quit this upcoming Sunday).  Patient was admitted for left upper extremity numbness x 3 days. No tPA given due to being outside of tPA window.  Symptoms have since resolved.  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  Patient counseled to be compliant with his antithrombotic medications Ongoing aggressive stroke risk factor management Therapy recommendations:  pending Disposition:  home likely  Hypertension Stable Permissive hypertension (OK if <220/120) for 24-48 hours post stroke and then gradually normalized within 5-7 days. Long term BP goal  normotensive  Hyperlipidemia Home meds:  none  LDL 139, goal < 70 Now on lipitor 40 mg Continue statin at discharge  Other Stroke Risk Factors Advanced age Cigarette smoker, advised to stop smoking Obesity, There is no height or weight on file to calculate BMI.  Hx of throat CA  Hospital day # 1   Posey Pronto PA-C [facility] [phone]  Discussed with attending neurologist review of note to follow from Dr. Erlinda Hong.    To contact Stroke Continuity provider, please refer to [email or web address] After hours, contact General Neurology

## 2020-08-23 NOTE — Procedures (Signed)
Patient Name: Samuel Serrano  MRN: [id number]  Epilepsy Attending: Lora Havens  Referring Physician/Provider: Dr Lesleigh Noe Date: 08/23/2020 Duration: 25.51 mins  Patient history: 70 year old male who presented with episode of tingling/numbness in left upper extremity. EEG to evaluate for seizures.  Level of alertness: Awake, asleep  AEDs during EEG study: None  Technical aspects: This EEG study was done with scalp electrodes positioned according to the 10-20 International system of electrode placement. Electrical activity was acquired at a sampling rate of '500Hz'$  and reviewed with a high frequency filter of '70Hz'$  and a low frequency filter of '1Hz'$ . EEG data were recorded continuously and digitally stored.   Description: The posterior dominant rhythm consists of 8-9 Hz activity of moderate voltage (25-35 uV) seen predominantly in posterior head regions, symmetric and reactive to eye opening and eye closing. Sleep was characterized by vertex waves, sleep spindles (12 to 14 Hz), maximal frontocentral region.  Hyperventilation and photic stimulation were not performed.     IMPRESSION: This study is within normal limits. No seizures or epileptiform discharges were seen throughout the recording.  Samuel Serrano

## 2020-08-23 NOTE — Progress Notes (Signed)
  Echocardiogram 2D Echocardiogram has been performed.  Samuel Serrano 08/23/2020, 3:51 PM

## 2020-08-23 NOTE — ED Notes (Signed)
Patient transported to MRI 

## 2020-08-23 NOTE — Progress Notes (Signed)
RN gave pt discharge instructions and he stated understanding, IV has been removed and pt wife at bedside. Spoke to CM about Code 44 prior to DC

## 2020-08-23 NOTE — Care Management (Signed)
ED RN Care Manager received call from floor RN concerning  Code 82 Flag while attempting to discharge patient .  CM contacted UR RN, Patient was discharged prior to Code 44 initiation.

## 2020-08-23 NOTE — Progress Notes (Addendum)
PROGRESS NOTE    Samuel Serrano  [id number] DOB: [birth year] DOA: 08/22/2020 PCP: Nicholes Rough, PA-C   Chief Complain: Numbness on left hand and arm and side of head  Brief Narrative:  Patient is a 70 year old male with history of throat cancer status post surgery, currently in remission, coronary artery disease, left carotid stenosis, peripheral vascular disease who presents with numbness of the left upper extremity and left side of the head.  Patient was admitted for stroke work-up.  MRI of the brain showed 1.9 cm focus of diffusion signal abnormality involving the medial right thalamus, suspicious for subacute ischemic infarct.  CTA head and neck showed 50% atheromatous stenosis in the right carotid bulb, no large LVO.  Neurology consulted and following.  He started stroke work-up.  PT/OT evaluation/echocardiogram pending.  Assessment & Plan:   Principal Problem:   Acute CVA (cerebrovascular accident) ([place]) Active Problems:   Prolonged QT interval   Tobacco use   DDD (degenerative disc disease), cervical  Acute [id number] with numbness of the left upper extremity and left side of the head.  Patient was admitted for stroke work-up.  MRI of the brain showed 1.9 cm focus of diffusion signal abnormality involving the medial right thalamus, suspicious for subacute ischemic infarct.  CTA head and neck showed 50% atheromatous stenosis in the right carotid bulb, no large LVO.  Neurology consulted and following.  He started stroke work-up.  PT/OT evaluation/echocardiogram pending. LDL of 139 A1C of 4.9 Currently on aspirin Plavix, Lipitor.  We will follow-up with neurology for further recommendation. He does not have any numbness.  No focal neurological deficits  History of throat cancer: Follows at [facility].  Status post surgery.  Currently in remission.  Previous smoker  Left carotid stenosis: Status post endarterectomy  Hypertension: No history of hypertension, does not take  any medications.  Blood pressure has been noted to be high. Allow permissive  hypertension for now.  Use as needed medications for severe hypertension.  We will consider adding antihypertensive if he remains persistently hypertensive            DVT prophylaxis:Lovenox Code Status: Full Family Communication: Wife at bedside Status is: Inpatient  Remains inpatient appropriate because:Ongoing diagnostic testing needed not appropriate for outpatient work up  Dispo: The patient is from: Home              Anticipated d/c is to: Home              Patient currently is not medically stable to d/c.   Difficult to place patient No    Consultants: Neurology  Procedures:None  Antimicrobials:  Anti-infectives (From admission, onward)    None       Subjective:  Patient seen and examined the bedside this morning.  Hemodynamically stable.  Denies any complaints today.  Numbness on the left upper extremity has resolved.  No focal neurological deficits observed.  Alert and oriented.   Objective: Vitals:   08/23/20 0715 08/23/20 1020 08/23/20 1223 08/23/20 1241  BP: (!) 157/78 (!) 177/84 (!) 168/107 (!) 157/92  Pulse: 64 72 60 67  Resp: 20 18 (!) 22 19  Temp: 98 F (36.7 C) 98 F (36.7 C) 98 F (36.7 C) 98.2 F (36.8 C)  TempSrc: Oral Oral Oral Oral  SpO2: 96% 95% 96% 96%   No intake or output data in the 24 hours ending 08/23/20 1426 There were no vitals filed for this visit.  Examination:  General exam: Appears calm and  comfortable ,Not in distress,average built HEENT:PERRL,Oral mucosa moist, Ear/Nose normal on gross exam Respiratory system: Bilateral equal air entry, normal vesicular breath sounds, no wheezes or crackles  Cardiovascular system: S1 & S2 heard, RRR. No JVD, murmurs, rubs, gallops or clicks. No pedal edema. Gastrointestinal system: Abdomen is nondistended, soft and nontender. No organomegaly or masses felt. Normal bowel sounds heard. Central nervous  system: Alert and oriented. No focal neurological deficits. Extremities: No edema, no clubbing ,no cyanosis, distal peripheral pulses palpable. Skin: No rashes, lesions or ulcers,no icterus ,no pallor MSK: Normal muscle bulk,tone ,power Psychiatry: Judgement and insight appear normal. Mood & affect appropriate.     Data Reviewed: I have personally reviewed following labs and imaging studies  CBC: Recent Labs  Lab 08/22/20 0038 08/22/20 0059  WBC 6.3  --   NEUTROABS 2.9  --   HGB 13.0 12.6*  HCT 39.2 37.0*  MCV 93.3  --   PLT 191  --    Basic Metabolic Panel: Recent Labs  Lab 08/22/20 0038 08/22/20 0059  NA 141 142  K 4.0 4.6  CL 107 109  CO2 26  --   GLUCOSE 118* 111*  BUN 17 22  CREATININE 1.09 1.10  CALCIUM 9.1  --    GFR: Estimated Creatinine Clearance: 81.8 mL/min (by C-G formula based on SCr of 1.1 mg/dL). Liver Function Tests: Recent Labs  Lab 08/22/20 0038  AST 23  ALT 13  ALKPHOS 56  BILITOT 0.7  PROT 6.5  ALBUMIN 3.7   No results for input(s): LIPASE, AMYLASE in the last 168 hours. No results for input(s): AMMONIA in the last 168 hours. Coagulation Profile: Recent Labs  Lab 08/22/20 0038  INR 1.0   Cardiac Enzymes: No results for input(s): CKTOTAL, CKMB, CKMBINDEX, TROPONINI in the last 168 hours. BNP (last 3 results) No results for input(s): PROBNP in the last 8760 hours. HbA1C: Recent Labs    08/23/20 0553  HGBA1C 4.9   CBG: No results for input(s): GLUCAP in the last 168 hours. Lipid Profile: Recent Labs    08/23/20 0553  CHOL 202*  HDL 39*  LDLCALC 139*  TRIG 119  CHOLHDL 5.2   Thyroid Function Tests: No results for input(s): TSH, T4TOTAL, FREET4, T3FREE, THYROIDAB in the last 72 hours. Anemia Panel: No results for input(s): VITAMINB12, FOLATE, FERRITIN, TIBC, IRON, RETICCTPCT in the last 72 hours. Sepsis Labs: No results for input(s): PROCALCITON, LATICACIDVEN in the last 168 hours.  Recent Results (from the past 240  hour(s))  Resp Panel by RT-PCR (Flu A&B, Covid) Nasopharyngeal Swab     Status: None   Collection Time: 08/23/20 12:55 AM   Specimen: Nasopharyngeal Swab; Nasopharyngeal(NP) swabs in vial transport medium  Result Value Ref Range Status   SARS Coronavirus 2 by RT PCR NEGATIVE NEGATIVE Final    Comment: (NOTE) SARS-CoV-2 target nucleic acids are NOT DETECTED.  The SARS-CoV-2 RNA is generally detectable in upper respiratory specimens during the acute phase of infection. The lowest concentration of SARS-CoV-2 viral copies this assay can detect is 138 copies/mL. A negative result does not preclude SARS-Cov-2 infection and should not be used as the sole basis for treatment or other patient management decisions. A negative result may occur with  improper specimen collection/handling, submission of specimen other than nasopharyngeal swab, presence of viral mutation(s) within the areas targeted by this assay, and inadequate number of viral copies(<138 copies/mL). A negative result must be combined with clinical observations, patient history, and epidemiological information. The expected result is  Negative.  Fact Sheet for Patients:  EntrepreneurPulse.com.au  Fact Sheet for Healthcare Providers:  IncredibleEmployment.be  This test is no t yet approved or cleared by the [place] FDA and  has been authorized for detection and/or diagnosis of SARS-CoV-2 by FDA under an Emergency Use Authorization (EUA). This EUA will remain  in effect (meaning this test can be used) for the duration of the COVID-19 declaration under Section 564(b)(1) of the Act, 21 U.S.C.section 360bbb-3(b)(1), unless the authorization is terminated  or revoked sooner.       Influenza A by PCR NEGATIVE NEGATIVE Final   Influenza B by PCR NEGATIVE NEGATIVE Final    Comment: (NOTE) The Xpert Xpress SARS-CoV-2/FLU/RSV plus assay is intended as an aid in the diagnosis of influenza from  Nasopharyngeal swab specimens and should not be used as a sole basis for treatment. Nasal washings and aspirates are unacceptable for Xpert Xpress SARS-CoV-2/FLU/RSV testing.  Fact Sheet for Patients: EntrepreneurPulse.com.au  Fact Sheet for Healthcare Providers: IncredibleEmployment.be  This test is not yet approved or cleared by the [place] FDA and has been authorized for detection and/or diagnosis of SARS-CoV-2 by FDA under an Emergency Use Authorization (EUA). This EUA will remain in effect (meaning this test can be used) for the duration of the COVID-19 declaration under Section 564(b)(1) of the Act, 21 U.S.C. section 360bbb-3(b)(1), unless the authorization is terminated or revoked.  Performed at [facility], [place] [address], [place], [place] [address]          Radiology Studies: CT ANGIO HEAD W OR WO CONTRAST  Result Date: 08/23/2020 CLINICAL DATA:  Initial evaluation for neuro deficit, stroke suspected. EXAM: CT ANGIOGRAPHY HEAD AND NECK TECHNIQUE: Multidetector CT imaging of the head and neck was performed using the standard protocol during bolus administration of intravenous contrast. Multiplanar CT image reconstructions and MIPs were obtained to evaluate the vascular anatomy. Carotid stenosis measurements (when applicable) are obtained utilizing NASCET criteria, using the distal internal carotid diameter as the denominator. CONTRAST:  172m OMNIPAQUE IOHEXOL 350 MG/ML SOLN COMPARISON:  MRI from 08/22/2020. FINDINGS: CTA NECK FINDINGS Aortic arch: Visualized aortic arch normal in caliber with normal branch pattern. Mild atheromatous change about the arch and in visualized origins of the great vessels without significant stenosis. Right carotid system: Right CCA patent without stenosis. Eccentric calcified plaque at the right carotid bulb with associated stenosis of up to approximate 50% by NASCET criteria. Right ICA patent  distally without stenosis, dissection or occlusion. Left carotid system: Left common and internal carotid arteries patent without stenosis, dissection or occlusion. Prior left endarterectomy without residual or recurrent stenosis. Vertebral arteries: Both vertebral arteries arise from subclavian arteries. No proximal subclavian artery stenosis. Left vertebral artery dominant. Vertebral arteries patent without stenosis, dissection or occlusion. Skeleton: Multilevel degenerative spondylosis, better evaluated on prior MRI. No discrete or worrisome osseous lesions. Other neck: Postoperative changes from recent tumor resection with lymph node dissection. Residual postoperative stranding within the right greater the left neck. No visible recurrent mass or adenopathy. Upper chest: Emphysema.  No other acute finding. Review of the MIP images confirms the above findings CTA HEAD FINDINGS Anterior circulation: Petrous segments widely patent. Atheromatous change within the carotid siphons with associated mild multifocal narrowing. A1 segments patent bilaterally. Left A1 hypoplastic. Normal anterior communicating artery complex. Anterior cerebral arteries patent to their distal aspects without stenosis. No M1 stenosis or occlusion. Normal MCA bifurcations. Distal MCA branches well perfused and symmetric. Posterior circulation: Both vertebral arteries patent to  the vertebrobasilar junction without stenosis. Both PICA origins patent and normal. Basilar patent to its distal aspect without stenosis. Superior cerebral arteries patent bilaterally. Left PCA widely patent to its distal aspect. There is a focal severe stenosis involving the proximal right P2 segment. Right PCA attenuated but patent distally. Venous sinuses: Grossly patent allowing for timing the contrast bolus. Anatomic variants: Hypoplastic left A1 segment.  No aneurysm. Review of the MIP images confirms the above findings IMPRESSION: 1. Negative CTA for large vessel  occlusion. 2. Short-segment severe proximal right P2 stenosis. Right PCA attenuated but patent distally. Finding is in keeping with the previously identified subacute right thalamic infarct. 3. 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 4. Prior left carotid endarterectomy without residual or recurrent stenosis. 5. No other hemodynamically significant or correctable stenosis within the major arterial vasculature of the head and neck. 6. Postoperative changes from recent tumor resection and lymph node dissection within the neck. 7. Emphysema (ICD10-J43.9). Electronically Signed   By: Jeannine Boga M.D.   On: 08/23/2020 02:52   CT HEAD WO CONTRAST (5MM)  Result Date: 08/22/2020 CLINICAL DATA:  Initial evaluation for neuro deficit, stroke suspected, intermittent left upper extremity and facial numbness. EXAM: CT HEAD WITHOUT CONTRAST TECHNIQUE: Contiguous axial images were obtained from the base of the skull through the vertex without intravenous contrast. COMPARISON:  Prior study from 07/18/2016. FINDINGS: Brain: Parietal predominant cerebral atrophy, progressed as compared to prior exam from 2018. Probable mild chronic microvascular ischemic disease noted involving the periventricular and deep white matter. Small remote lacunar infarct noted involving the right caudate body. No acute intracranial hemorrhage. No visible acute large vessel territory infarct. No mass lesion, mass effect, or midline shift. No hydrocephalus or extra-axial fluid collection. Vascular: No hyperdense vessel. Scattered vascular calcifications noted within the carotid siphons. Skull: No scalp soft tissue abnormality.  Calvarium intact. Sinuses/Orbits: Globes and orbital soft tissues demonstrate no acute finding. Scattered mucoperiosteal thickening noted throughout the paranasal sinuses. No air-fluid levels to suggest acute sinusitis. Mastoid air cells and middle ear cavities are well pneumatized and free of fluid. Other:  None. IMPRESSION: 1. No acute intracranial abnormality. 2. Parietal predominant cerebral atrophy with mild chronic small vessel ischemic disease, progressed as compared to prior exam from 2018. 3. 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 Electronically Signed   By: Jeannine Boga M.D.   On: 08/22/2020 04:35   CT ANGIO NECK W OR WO CONTRAST  Result Date: 08/23/2020 CLINICAL DATA:  Initial evaluation for neuro deficit, stroke suspected. EXAM: CT ANGIOGRAPHY HEAD AND NECK TECHNIQUE: Multidetector CT imaging of the head and neck was performed using the standard protocol during bolus administration of intravenous contrast. Multiplanar CT image reconstructions and MIPs were obtained to evaluate the vascular anatomy. Carotid stenosis measurements (when applicable) are obtained utilizing NASCET criteria, using the distal internal carotid diameter as the denominator. CONTRAST:  150m OMNIPAQUE IOHEXOL 350 MG/ML SOLN COMPARISON:  MRI from 08/22/2020. FINDINGS: CTA NECK FINDINGS Aortic arch: Visualized aortic arch normal in caliber with normal branch pattern. Mild atheromatous change about the arch and in visualized origins of the great vessels without significant stenosis. Right carotid system: Right CCA patent without stenosis. Eccentric calcified plaque at the right carotid bulb with associated stenosis of up to approximate 50% by NASCET criteria. Right ICA patent distally without stenosis, dissection or occlusion. Left carotid system: Left common and internal carotid arteries patent without stenosis, dissection or occlusion. Prior left endarterectomy without residual or recurrent stenosis. Vertebral  arteries: Both vertebral arteries arise from subclavian arteries. No proximal subclavian artery stenosis. Left vertebral artery dominant. Vertebral arteries patent without stenosis, dissection or occlusion. Skeleton: Multilevel degenerative spondylosis, better evaluated on prior MRI. No discrete or  worrisome osseous lesions. Other neck: Postoperative changes from recent tumor resection with lymph node dissection. Residual postoperative stranding within the right greater the left neck. No visible recurrent mass or adenopathy. Upper chest: Emphysema.  No other acute finding. Review of the MIP images confirms the above findings CTA HEAD FINDINGS Anterior circulation: Petrous segments widely patent. Atheromatous change within the carotid siphons with associated mild multifocal narrowing. A1 segments patent bilaterally. Left A1 hypoplastic. Normal anterior communicating artery complex. Anterior cerebral arteries patent to their distal aspects without stenosis. No M1 stenosis or occlusion. Normal MCA bifurcations. Distal MCA branches well perfused and symmetric. Posterior circulation: Both vertebral arteries patent to the vertebrobasilar junction without stenosis. Both PICA origins patent and normal. Basilar patent to its distal aspect without stenosis. Superior cerebral arteries patent bilaterally. Left PCA widely patent to its distal aspect. There is a focal severe stenosis involving the proximal right P2 segment. Right PCA attenuated but patent distally. Venous sinuses: Grossly patent allowing for timing the contrast bolus. Anatomic variants: Hypoplastic left A1 segment.  No aneurysm. Review of the MIP images confirms the above findings IMPRESSION: 1. Negative CTA for large vessel occlusion. 2. Short-segment severe proximal right P2 stenosis. Right PCA attenuated but patent distally. Finding is in keeping with the previously identified subacute right thalamic infarct. 3. 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 4. Prior left carotid endarterectomy without residual or recurrent stenosis. 5. No other hemodynamically significant or correctable stenosis within the major arterial vasculature of the head and neck. 6. Postoperative changes from recent tumor resection and lymph node dissection within the  neck. 7. Emphysema (ICD10-J43.9). Electronically Signed   By: Jeannine Boga M.D.   On: 08/23/2020 02:52   CT Cervical Spine Wo Contrast  Result Date: 08/22/2020 CLINICAL DATA:  Initial evaluation for intermittent left upper extremity and facial numbness for 4 days. EXAM: CT CERVICAL SPINE WITHOUT CONTRAST TECHNIQUE: Multidetector CT imaging of the cervical spine was performed without intravenous contrast. Multiplanar CT image reconstructions were also generated. COMPARISON:  Prior CT of the neck from 04/22/2020. FINDINGS: Alignment: Please note that this examination was performed during a downtime and is somewhat technically limited as the scanner abruptly froze/stopped during image acquisition. Axial images of the cervical spine in bone and soft tissue window algorithm are provided, however, the coronal and sagittal reformatted images were constructed through post processing, and are of markedly low resolution and fairly limited. Vertebral bodies normally aligned with preservation of the normal cervical lordosis. No significant listhesis. Skull base and vertebrae: Skull base grossly intact. Normal C1-2 articulations are preserved. Dens grossly intact. Vertebral body height maintained. No visible fracture on this technically limited exam. No discrete lytic or blastic osseous lesions. Soft tissues and spinal canal: Postoperative changes from recent tumor resection with nodal dissection seen within the oropharynx and neck. Mild hazy stranding within the right greater than left neck favored to be postoperative in nature. No significant prevertebral edema. Atherosclerotic calcification noted about the right carotid bifurcation. Disc levels: C2-3: Negative interspace. Left-sided facet hypertrophy. No stenosis. C3-4: Disc bulge with bilateral uncovertebral hypertrophy. Superimposed small central disc protrusion. No significant spinal stenosis. Mild to moderate right greater than left C4 foraminal narrowing.  C4-5: Central disc protrusion indents the ventral thecal sac, contacting the ventral cord (  series 9, image 66). Mild spinal stenosis with minimal cord flattening. Superimposed uncovertebral and facet hypertrophy with mild to moderate bilateral C5 foraminal narrowing. C5-6: Degenerative intervertebral disc space narrowing with diffuse disc osteophyte complex, eccentric to the right. Flattening of the ventral thecal sac with resultant mild spinal stenosis. Severe right with moderate left C6 foraminal narrowing. C6-7: Degenerative intervertebral disc space narrowing with diffuse disc osteophyte complex. Posterior component indents and partially faces the ventral thecal sac with at least mild spinal stenosis. Moderate left with mild right C7 foraminal narrowing. C7-T1: Negative interspace. Mild facet hypertrophy. No significant stenosis. Upper chest: Visualized upper chest demonstrates no acute finding. Emphysematous changes noted within the visualized lungs. Other: None. IMPRESSION: 1. Limited exam due to technical difficulty during image acquisition as described above. 2. No definite acute osseous abnormality within the cervical spine. 3. Central disc protrusion at C4-5 with resultant mild spinal stenosis with minimal cord flattening. 4. Disc osteophyte complexes at C5-6 and C6-7 with resultant mild spinal stenosis. Severe right with moderate left C6 foraminal narrowing, with moderate left C7 foraminal stenosis. 5. Postoperative changes from recent tumor resection with nodal dissection within the oropharynx and neck. No visible adverse features. 6. Emphysema (ICD10-J43.9). Electronically Signed   By: Jeannine Boga M.D.   On: 08/22/2020 05:01   MR BRAIN WO CONTRAST  Result Date: 08/22/2020 CLINICAL DATA:  Initial evaluation for left facial and upper extremity numbness for 4 days. EXAM: MRI HEAD WITHOUT CONTRAST TECHNIQUE: Multiplanar, multiecho pulse sequences of the brain and surrounding structures were  obtained without intravenous contrast. COMPARISON:  CT from earlier the same day. FINDINGS: Brain: Diffuse prominence of the CSF containing spaces compatible with moderately advanced cerebral and cerebellar atrophy, more pronounced on the right. Patchy T2/FLAIR hyperintensity within the periventricular deep white matter both cerebral hemispheres most consistent with chronic small vessel ischemic disease, moderately advanced in nature. Small area of encephalomalacia and gliosis involving the cortical and subcortical left parietal lobe consistent with a chronic ischemic infarct (series 11, image 19). Associated chronic hemosiderin staining present within this region. There is a well demarcated somewhat elongated focus of diffusion signal abnormality involving medial right thalamus measuring 1.9 cm (series 5, image 78). Associated T2/FLAIR signal hyperintensity without discernible ADC correlate. Finding is favored to reflect an evolving subacute ischemic infarct. No significant mass effect or associated hemorrhage. No other diffusion abnormality to suggest acute or subacute ischemia. Gray-white matter differentiation otherwise maintained. No other areas of acute or chronic hemorrhage. No other mass lesion, midline shift or mass effect. No hydrocephalus or extra-axial fluid collection. Partially empty sella noted. Vascular: Major intracranial vascular flow voids are maintained. Skull and upper cervical spine: Craniocervical junction normal. Bone marrow signal intensity within normal limits. No scalp soft tissue abnormality. Sinuses/Orbits: Globes and orbital soft tissues within normal limits. Scattered mucosal thickening noted throughout the paranasal sinuses. No significant mastoid effusion. Inner ear structures grossly normal. Other: None. IMPRESSION: 1. 1.9 cm focus of diffusion signal abnormality involving the medial right thalamus, favored to reflect an evolving subacute ischemic infarct. No associated hemorrhage  or mass effect. A short interval follow-up MRI to ensure these changes resolve is recommended, particularly given the patient history of malignancy, as an underlying neoplasm could conceivably have this appearance as well. 2. No other acute intracranial abnormality. 3. Moderately advanced cerebral and cerebellar atrophy with chronic small vessel ischemic disease. Superimposed small remote left parietal infarct. Electronically Signed   By: Jeannine Boga M.D.   On: 08/22/2020 19:52  MR BRAIN W CONTRAST  Result Date: 08/23/2020 CLINICAL DATA:  Abnormal MRI brain, history of head and neck cancer EXAM: MRI HEAD WITH CONTRAST TECHNIQUE: Multiplanar, multiecho pulse sequences of the brain and surrounding structures were obtained with intravenous contrast. CONTRAST:  45m GADAVIST GADOBUTROL 1 MMOL/ML IV SOLN COMPARISON:  Noncontrast MRI 08/22/2020 FINDINGS: There is thin enhancement at the periphery of the medial right thalamic signal abnormality. Otherwise, no abnormal enhancement. IMPRESSION: Thin enhancement at the periphery of medial right thalamic signal abnormality. This is unlikely to represent a metastasis but follow-up study without and with contrast is recommended after several weeks. Electronically Signed   By: PMacy MisM.D.   On: 08/23/2020 09:30   MR Cervical Spine Wo Contrast  Result Date: 08/22/2020 CLINICAL DATA:  Initial evaluation for left facial and upper extremity numbness for 4 days. EXAM: MRI CERVICAL SPINE WITHOUT CONTRAST TECHNIQUE: Multiplanar, multisequence MR imaging of the cervical spine was performed. No intravenous contrast was administered. COMPARISON:  CT from earlier the same day. FINDINGS: Alignment: Straightening of the normal cervical lordosis. Trace anterolisthesis of C5 on C6. Vertebrae: Vertebral body height maintained without acute or chronic fracture. Bone marrow signal intensity within normal limits. No discrete or worrisome osseous lesions. No abnormal marrow  edema. Cord: Normal signal and morphology. Posterior Fossa, vertebral arteries, paraspinal tissues: Craniocervical junction normal. Paraspinous and prevertebral soft tissues demonstrate no acute finding. Normal flow voids seen within the carotid and vertebral arteries bilaterally. Disc levels: C2-C3: Normal interspace. Mild left greater than right facet hypertrophy. No canal or foraminal stenosis. C3-C4: Mild disc bulge with uncovertebral hypertrophy. Superimposed small central disc protrusion indents the ventral thecal sac (series 10, image 17). No significant spinal stenosis or cord deformity. Moderate bilateral C4 foraminal stenosis. C4-C5: Central/right paracentral disc protrusion indents the ventral thecal sac, contacting and mildly flattening the ventral cord. No cord signal changes. Mild spinal stenosis. Mild to moderate bilateral C5 foraminal narrowing. C5-C6: Trace anterolisthesis. Degenerative intervertebral disc space narrowing with diffuse disc osteophyte complex, eccentric to the right. Broad posterior component flattens and effaces the ventral thecal sac with resultant mild to moderate spinal stenosis. Minimal cord flattening without cord signal changes. Severe right with moderate left C6 foraminal narrowing. C6-C7: Degenerative intervertebral disc space narrowing with diffuse disc osteophyte complex, slightly eccentric to the right. Posterior component flattens and partially effaces the ventral thecal sac, greater on the right. Mild spinal stenosis without frank cord impingement. Moderate left C7 foraminal narrowing. Right neural foramina remains patent. C7-T1: Negative interspace.  Mild facet hypertrophy.  No stenosis. Visualized upper thoracic spine demonstrates no significant finding. IMPRESSION: 1. No acute abnormality within the cervical spine. 2. 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 3. Multifactorial degenerative changes with  resultant multilevel foraminal narrowing as above. Notable findings include moderate bilateral C4 foraminal stenosis, severe right with moderate left C6 foraminal narrowing, with moderate left C7 foraminal stenosis. Electronically Signed   By: BJeannine BogaM.D.   On: 08/22/2020 20:03   EEG adult  Result Date: 08/23/2020 YLora Havens MD     08/23/2020 10:24 AM Patient Name: JDaran LawheadMRN: [id number] Attending: PLora HavensReferring Physician/Provider: Dr SLesleigh NoeDate: 08/23/2020 Duration: 25.51 mins Patient history: 70year old male who presented with episode of tingling/numbness in left upper extremity. EEG to evaluate for seizures. Level of alertness: Awake, asleep AEDs during EEG study: None Technical aspects: This EEG study was done with scalp electrodes positioned according to  the 10-20 International system of electrode placement. Electrical activity was acquired at a sampling rate of '500Hz'$  and reviewed with a high frequency filter of '70Hz'$  and a low frequency filter of '1Hz'$ . EEG data were recorded continuously and digitally stored. Description: The posterior dominant rhythm consists of 8-9 Hz activity of moderate voltage (25-35 uV) seen predominantly in posterior head regions, symmetric and reactive to eye opening and eye closing. Sleep was characterized by vertex waves, sleep spindles (12 to 14 Hz), maximal frontocentral region.  Hyperventilation and photic stimulation were not performed.   IMPRESSION: This study is within normal limits. No seizures or epileptiform discharges were seen throughout the recording. Priyanka Barbra Sarks        Scheduled Meds:  aspirin EC  325 mg Oral Daily   atorvastatin  40 mg Oral Daily   clopidogrel  75 mg Oral Daily   nicotine  14 mg Transdermal Daily   Continuous Infusions:   LOS: 1 day    Time spent:35 mins.. More than 50% of that time was spent in counseling and/or coordination of care.      Shelly Coss, MD [facility] P8/12/2020, 2:26 PM

## 2020-08-23 NOTE — Discharge Summary (Addendum)
Physician Discharge Summary  Delores Alder [id number] DOB: 09-16-1950 DOA: 08/22/2020  PCP: Nicholes Rough, PA-C  Admit date: 08/22/2020 Discharge date: 08/23/2020  Admitted From: Home Disposition:  Home  Discharge Condition:Stable CODE STATUS:FULL Diet recommendation: Heart Healthy   Brief/Interim Summary:  Patient is a 70 year old male with history of throat cancer status post surgery, currently in remission, coronary artery disease, left carotid stenosis, peripheral vascular disease who presents with numbness of the left upper extremity and left side of the head.  Patient was admitted for stroke work-up.  MRI of the brain showed 1.9 cm focus of diffusion signal abnormality involving the medial right thalamus, suspicious for subacute ischemic infarct.  CTA head and neck showed 50% atheromatous stenosis in the right carotid bulb, no large LVO.  Neurology consulted and were following. Stroke work-up completed.  PT/OT recommended no follow-up required.  He will follow-up with neurology as an outpatient.  Following problems were addressed during his hospitalization:  Acute [id number] with numbness of the left upper extremity and left side of the head.  Patient was admitted for stroke work-up.  MRI of the brain showed 1.9 cm focus of diffusion signal abnormality involving the medial right thalamus, suspicious for subacute ischemic infarct.  CTA head and neck showed 50% atheromatous stenosis in the right carotid bulb, no large LVO.  Neurology were consulted and following. PT/OT evaluation done ,no follow up recommended Echocardiogram showed EF of 50 to 55%, no intracardiac source of emboli. LDL of 139 A1C of 4.9 Currently does not have does not have any numbness.  No focal neurological deficits Neurology recommended aspirin 325 mg and Plavix for 3 months and to be continued on aspirin thereafter.  He will follow-up with [facility] neurology as an outpatient.   History of throat cancer: Follows at  [facility].  Status post surgery.  Currently in remission.  Previous smoker   Left carotid stenosis: Status post endarterectomy   Hypertension: No history of hypertension, does not take any medications.  Blood pressure was noted to be high side.  We will allow permissive  hypertension for now.  He needs to follow-up with his PCP in a week and be considered on  adding antihypertensive if he is still found to be hypertensive  Discharge Diagnoses:  Principal Problem:   Acute CVA (cerebrovascular accident) ([place]) Active Problems:   Prolonged QT interval   Tobacco use   DDD (degenerative disc disease), cervical    Discharge Instructions  Discharge Instructions     Ambulatory referral to Neurology   Complete by: As directed    Follow up with stroke clinic NP (Jessica Vanschaick or Cecille Rubin, if both not available, consider Zachery Dauer, or Ahern) at [facility] in about 4 weeks. Thanks.   Diet - low sodium heart healthy   Complete by: As directed    Discharge instructions   Complete by: As directed    1)Please take prescribed medications as instructed.Continue aspirin and plavix for 3 months and continue taking aspirin only therafter. 2)Follow up with [facility] neurology in 4 weeks 3)Follow up with your PCP and oncologist.   Increase activity slowly   Complete by: As directed       Allergies as of 08/23/2020       Reactions   No Known Allergies         Medication List     STOP taking these medications    nicotine 14 mg/24hr patch Commonly known as: NICODERM CQ - dosed in mg/24 hours   nicotine  21 mg/24hr patch Commonly known as: NICODERM CQ - dosed in mg/24 hours   nicotine 7 mg/24hr patch Commonly known as: NICODERM CQ - dosed in mg/24 hr       TAKE these medications    aspirin 325 MG EC tablet Take 1 tablet (325 mg total) by mouth daily.   atorvastatin 40 MG tablet Commonly known as: LIPITOR Take 1 tablet (40 mg total) by mouth daily.    buPROPion 150 MG 12 hr tablet Commonly known as: Wellbutrin SR Start one week before quit date. Take 1 tab daily x 3 days, then 1 tab BID thereafter. What changed:  how much to take how to take this when to take this additional instructions   clopidogrel 75 MG tablet Commonly known as: PLAVIX Take 1 tablet (75 mg total) by mouth daily.   fluorometholone 0.1 % ophthalmic suspension Commonly known as: FML Place 1 drop into the left eye 4 (four) times daily.   hydrOXYzine 25 MG tablet Commonly known as: ATARAX/VISTARIL Take 100 mg by mouth at bedtime.   naproxen sodium 220 MG tablet Commonly known as: ALEVE Take 220 mg by mouth daily as needed (pain).   neomycin-polymyxin b-dexamethasone 3.5-10000-0.1 Oint Commonly known as: MAXITROL Place 1 application into the left eye 2 (two) times daily.        Follow-up Information     [facility]. Schedule an appointment as soon as possible for a visit in 1 month(s).   Specialty: Neurology Why: stroke clinic Contact information: [facility] [place] [facility] .   Specialty: Radiology Contact information: [id number] [place] [address] [facility] .   Specialty: Cardiology Contact information: [id number] [place] [facility] .   Specialty: Radiology Contact information: [id number] [phone]               Allergies  Allergen Reactions   No Known Allergies     Consultations: Neurology   Procedures/Studies: CT ANGIO HEAD W OR WO CONTRAST  Result Date: 08/23/2020 CLINICAL DATA:  Initial evaluation for neuro deficit, stroke suspected. EXAM: CT  ANGIOGRAPHY HEAD AND NECK TECHNIQUE: Multidetector CT imaging of the head and neck was performed using the standard protocol during bolus administration of intravenous contrast. Multiplanar CT image reconstructions and MIPs were obtained to evaluate the vascular anatomy. Carotid stenosis measurements (when applicable) are obtained utilizing NASCET criteria, using the distal internal carotid diameter as the denominator. CONTRAST:  141m OMNIPAQUE IOHEXOL 350 MG/ML SOLN COMPARISON:  MRI from 08/22/2020. FINDINGS: CTA NECK FINDINGS Aortic arch: Visualized aortic arch normal in caliber with normal branch pattern. Mild atheromatous change about the arch and in visualized origins of the great vessels without significant stenosis. Right carotid system: Right CCA patent without stenosis. Eccentric calcified plaque at the right carotid bulb with associated stenosis of up to approximate 50% by NASCET criteria. Right ICA patent distally without stenosis, dissection or occlusion. Left carotid system: Left common and internal carotid arteries patent without stenosis, dissection or occlusion. Prior left endarterectomy without residual or recurrent stenosis. Vertebral arteries: Both vertebral arteries arise from subclavian arteries. No proximal subclavian artery stenosis. Left vertebral artery dominant. Vertebral arteries patent without stenosis,  dissection or occlusion. Skeleton: Multilevel degenerative spondylosis, better evaluated on prior MRI. No discrete or worrisome osseous lesions. Other neck: Postoperative changes from recent tumor resection with lymph node dissection. Residual postoperative stranding within the right greater the left neck. No visible recurrent mass or adenopathy. Upper chest: Emphysema.  No other acute finding. Review of the MIP images confirms the above findings CTA HEAD FINDINGS Anterior circulation: Petrous segments widely patent. Atheromatous change within the carotid siphons with associated mild  multifocal narrowing. A1 segments patent bilaterally. Left A1 hypoplastic. Normal anterior communicating artery complex. Anterior cerebral arteries patent to their distal aspects without stenosis. No M1 stenosis or occlusion. Normal MCA bifurcations. Distal MCA branches well perfused and symmetric. Posterior circulation: Both vertebral arteries patent to the vertebrobasilar junction without stenosis. Both PICA origins patent and normal. Basilar patent to its distal aspect without stenosis. Superior cerebral arteries patent bilaterally. Left PCA widely patent to its distal aspect. There is a focal severe stenosis involving the proximal right P2 segment. Right PCA attenuated but patent distally. Venous sinuses: Grossly patent allowing for timing the contrast bolus. Anatomic variants: Hypoplastic left A1 segment.  No aneurysm. Review of the MIP images confirms the above findings IMPRESSION: 1. Negative CTA for large vessel occlusion. 2. Short-segment severe proximal right P2 stenosis. Right PCA attenuated but patent distally. Finding is in keeping with the previously identified subacute right thalamic infarct. 3. 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 4. Prior left carotid endarterectomy without residual or recurrent stenosis. 5. No other hemodynamically significant or correctable stenosis within the major arterial vasculature of the head and neck. 6. Postoperative changes from recent tumor resection and lymph node dissection within the neck. 7. Emphysema (ICD10-J43.9). Electronically Signed   By: Jeannine Boga M.D.   On: 08/23/2020 02:52   CT HEAD WO CONTRAST (5MM)  Result Date: 08/22/2020 CLINICAL DATA:  Initial evaluation for neuro deficit, stroke suspected, intermittent left upper extremity and facial numbness. EXAM: CT HEAD WITHOUT CONTRAST TECHNIQUE: Contiguous axial images were obtained from the base of the skull through the vertex without intravenous contrast. COMPARISON:  Prior study  from 07/18/2016. FINDINGS: Brain: Parietal predominant cerebral atrophy, progressed as compared to prior exam from 2018. Probable mild chronic microvascular ischemic disease noted involving the periventricular and deep white matter. Small remote lacunar infarct noted involving the right caudate body. No acute intracranial hemorrhage. No visible acute large vessel territory infarct. No mass lesion, mass effect, or midline shift. No hydrocephalus or extra-axial fluid collection. Vascular: No hyperdense vessel. Scattered vascular calcifications noted within the carotid siphons. Skull: No scalp soft tissue abnormality.  Calvarium intact. Sinuses/Orbits: Globes and orbital soft tissues demonstrate no acute finding. Scattered mucoperiosteal thickening noted throughout the paranasal sinuses. No air-fluid levels to suggest acute sinusitis. Mastoid air cells and middle ear cavities are well pneumatized and free of fluid. Other: None. IMPRESSION: 1. No acute intracranial abnormality. 2. Parietal predominant cerebral atrophy with mild chronic small vessel ischemic disease, progressed as compared to prior exam from 2018. 3. 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 Electronically Signed   By: Jeannine Boga M.D.   On: 08/22/2020 04:35   CT ANGIO NECK W OR WO CONTRAST  Result Date: 08/23/2020 CLINICAL DATA:  Initial evaluation for neuro deficit, stroke suspected. EXAM: CT ANGIOGRAPHY HEAD AND NECK TECHNIQUE: Multidetector CT imaging of the head and neck was performed using the standard protocol during bolus administration of intravenous contrast. Multiplanar CT image reconstructions and MIPs were obtained to evaluate the  vascular anatomy. Carotid stenosis measurements (when applicable) are obtained utilizing NASCET criteria, using the distal internal carotid diameter as the denominator. CONTRAST:  111m OMNIPAQUE IOHEXOL 350 MG/ML SOLN COMPARISON:  MRI from 08/22/2020. FINDINGS: CTA NECK FINDINGS Aortic  arch: Visualized aortic arch normal in caliber with normal branch pattern. Mild atheromatous change about the arch and in visualized origins of the great vessels without significant stenosis. Right carotid system: Right CCA patent without stenosis. Eccentric calcified plaque at the right carotid bulb with associated stenosis of up to approximate 50% by NASCET criteria. Right ICA patent distally without stenosis, dissection or occlusion. Left carotid system: Left common and internal carotid arteries patent without stenosis, dissection or occlusion. Prior left endarterectomy without residual or recurrent stenosis. Vertebral arteries: Both vertebral arteries arise from subclavian arteries. No proximal subclavian artery stenosis. Left vertebral artery dominant. Vertebral arteries patent without stenosis, dissection or occlusion. Skeleton: Multilevel degenerative spondylosis, better evaluated on prior MRI. No discrete or worrisome osseous lesions. Other neck: Postoperative changes from recent tumor resection with lymph node dissection. Residual postoperative stranding within the right greater the left neck. No visible recurrent mass or adenopathy. Upper chest: Emphysema.  No other acute finding. Review of the MIP images confirms the above findings CTA HEAD FINDINGS Anterior circulation: Petrous segments widely patent. Atheromatous change within the carotid siphons with associated mild multifocal narrowing. A1 segments patent bilaterally. Left A1 hypoplastic. Normal anterior communicating artery complex. Anterior cerebral arteries patent to their distal aspects without stenosis. No M1 stenosis or occlusion. Normal MCA bifurcations. Distal MCA branches well perfused and symmetric. Posterior circulation: Both vertebral arteries patent to the vertebrobasilar junction without stenosis. Both PICA origins patent and normal. Basilar patent to its distal aspect without stenosis. Superior cerebral arteries patent bilaterally. Left  PCA widely patent to its distal aspect. There is a focal severe stenosis involving the proximal right P2 segment. Right PCA attenuated but patent distally. Venous sinuses: Grossly patent allowing for timing the contrast bolus. Anatomic variants: Hypoplastic left A1 segment.  No aneurysm. Review of the MIP images confirms the above findings IMPRESSION: 1. Negative CTA for large vessel occlusion. 2. Short-segment severe proximal right P2 stenosis. Right PCA attenuated but patent distally. Finding is in keeping with the previously identified subacute right thalamic infarct. 3. 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 4. Prior left carotid endarterectomy without residual or recurrent stenosis. 5. No other hemodynamically significant or correctable stenosis within the major arterial vasculature of the head and neck. 6. Postoperative changes from recent tumor resection and lymph node dissection within the neck. 7. Emphysema (ICD10-J43.9). Electronically Signed   By: BJeannine BogaM.D.   On: 08/23/2020 02:52   CT Cervical Spine Wo Contrast  Result Date: 08/22/2020 CLINICAL DATA:  Initial evaluation for intermittent left upper extremity and facial numbness for 4 days. EXAM: CT CERVICAL SPINE WITHOUT CONTRAST TECHNIQUE: Multidetector CT imaging of the cervical spine was performed without intravenous contrast. Multiplanar CT image reconstructions were also generated. COMPARISON:  Prior CT of the neck from 04/22/2020. FINDINGS: Alignment: Please note that this examination was performed during a downtime and is somewhat technically limited as the scanner abruptly froze/stopped during image acquisition. Axial images of the cervical spine in bone and soft tissue window algorithm are provided, however, the coronal and sagittal reformatted images were constructed through post processing, and are of markedly low resolution and fairly limited. Vertebral bodies normally aligned with preservation of the normal  cervical lordosis. No significant listhesis. Skull base and vertebrae: Skull  base grossly intact. Normal C1-2 articulations are preserved. Dens grossly intact. Vertebral body height maintained. No visible fracture on this technically limited exam. No discrete lytic or blastic osseous lesions. Soft tissues and spinal canal: Postoperative changes from recent tumor resection with nodal dissection seen within the oropharynx and neck. Mild hazy stranding within the right greater than left neck favored to be postoperative in nature. No significant prevertebral edema. Atherosclerotic calcification noted about the right carotid bifurcation. Disc levels: C2-3: Negative interspace. Left-sided facet hypertrophy. No stenosis. C3-4: Disc bulge with bilateral uncovertebral hypertrophy. Superimposed small central disc protrusion. No significant spinal stenosis. Mild to moderate right greater than left C4 foraminal narrowing. C4-5: Central disc protrusion indents the ventral thecal sac, contacting the ventral cord (series 9, image 66). Mild spinal stenosis with minimal cord flattening. Superimposed uncovertebral and facet hypertrophy with mild to moderate bilateral C5 foraminal narrowing. C5-6: Degenerative intervertebral disc space narrowing with diffuse disc osteophyte complex, eccentric to the right. Flattening of the ventral thecal sac with resultant mild spinal stenosis. Severe right with moderate left C6 foraminal narrowing. C6-7: Degenerative intervertebral disc space narrowing with diffuse disc osteophyte complex. Posterior component indents and partially faces the ventral thecal sac with at least mild spinal stenosis. Moderate left with mild right C7 foraminal narrowing. C7-T1: Negative interspace. Mild facet hypertrophy. No significant stenosis. Upper chest: Visualized upper chest demonstrates no acute finding. Emphysematous changes noted within the visualized lungs. Other: None. IMPRESSION: 1. Limited exam due to  technical difficulty during image acquisition as described above. 2. No definite acute osseous abnormality within the cervical spine. 3. Central disc protrusion at C4-5 with resultant mild spinal stenosis with minimal cord flattening. 4. Disc osteophyte complexes at C5-6 and C6-7 with resultant mild spinal stenosis. Severe right with moderate left C6 foraminal narrowing, with moderate left C7 foraminal stenosis. 5. Postoperative changes from recent tumor resection with nodal dissection within the oropharynx and neck. No visible adverse features. 6. Emphysema (ICD10-J43.9). Electronically Signed   By: Jeannine Boga M.D.   On: 08/22/2020 05:01   MR BRAIN WO CONTRAST  Result Date: 08/22/2020 CLINICAL DATA:  Initial evaluation for left facial and upper extremity numbness for 4 days. EXAM: MRI HEAD WITHOUT CONTRAST TECHNIQUE: Multiplanar, multiecho pulse sequences of the brain and surrounding structures were obtained without intravenous contrast. COMPARISON:  CT from earlier the same day. FINDINGS: Brain: Diffuse prominence of the CSF containing spaces compatible with moderately advanced cerebral and cerebellar atrophy, more pronounced on the right. Patchy T2/FLAIR hyperintensity within the periventricular deep white matter both cerebral hemispheres most consistent with chronic small vessel ischemic disease, moderately advanced in nature. Small area of encephalomalacia and gliosis involving the cortical and subcortical left parietal lobe consistent with a chronic ischemic infarct (series 11, image 19). Associated chronic hemosiderin staining present within this region. There is a well demarcated somewhat elongated focus of diffusion signal abnormality involving medial right thalamus measuring 1.9 cm (series 5, image 78). Associated T2/FLAIR signal hyperintensity without discernible ADC correlate. Finding is favored to reflect an evolving subacute ischemic infarct. No significant mass effect or associated  hemorrhage. No other diffusion abnormality to suggest acute or subacute ischemia. Gray-white matter differentiation otherwise maintained. No other areas of acute or chronic hemorrhage. No other mass lesion, midline shift or mass effect. No hydrocephalus or extra-axial fluid collection. Partially empty sella noted. Vascular: Major intracranial vascular flow voids are maintained. Skull and upper cervical spine: Craniocervical junction normal. Bone marrow signal intensity within normal limits. No scalp soft tissue  abnormality. Sinuses/Orbits: Globes and orbital soft tissues within normal limits. Scattered mucosal thickening noted throughout the paranasal sinuses. No significant mastoid effusion. Inner ear structures grossly normal. Other: None. IMPRESSION: 1. 1.9 cm focus of diffusion signal abnormality involving the medial right thalamus, favored to reflect an evolving subacute ischemic infarct. No associated hemorrhage or mass effect. A short interval follow-up MRI to ensure these changes resolve is recommended, particularly given the patient history of malignancy, as an underlying neoplasm could conceivably have this appearance as well. 2. No other acute intracranial abnormality. 3. Moderately advanced cerebral and cerebellar atrophy with chronic small vessel ischemic disease. Superimposed small remote left parietal infarct. Electronically Signed   By: Jeannine Boga M.D.   On: 08/22/2020 19:52   MR BRAIN W CONTRAST  Result Date: 08/23/2020 CLINICAL DATA:  Abnormal MRI brain, history of head and neck cancer EXAM: MRI HEAD WITH CONTRAST TECHNIQUE: Multiplanar, multiecho pulse sequences of the brain and surrounding structures were obtained with intravenous contrast. CONTRAST:  26m GADAVIST GADOBUTROL 1 MMOL/ML IV SOLN COMPARISON:  Noncontrast MRI 08/22/2020 FINDINGS: There is thin enhancement at the periphery of the medial right thalamic signal abnormality. Otherwise, no abnormal enhancement. IMPRESSION:  Thin enhancement at the periphery of medial right thalamic signal abnormality. This is unlikely to represent a metastasis but follow-up study without and with contrast is recommended after several weeks. Electronically Signed   By: PMacy MisM.D.   On: 08/23/2020 09:30   MR Cervical Spine Wo Contrast  Result Date: 08/22/2020 CLINICAL DATA:  Initial evaluation for left facial and upper extremity numbness for 4 days. EXAM: MRI CERVICAL SPINE WITHOUT CONTRAST TECHNIQUE: Multiplanar, multisequence MR imaging of the cervical spine was performed. No intravenous contrast was administered. COMPARISON:  CT from earlier the same day. FINDINGS: Alignment: Straightening of the normal cervical lordosis. Trace anterolisthesis of C5 on C6. Vertebrae: Vertebral body height maintained without acute or chronic fracture. Bone marrow signal intensity within normal limits. No discrete or worrisome osseous lesions. No abnormal marrow edema. Cord: Normal signal and morphology. Posterior Fossa, vertebral arteries, paraspinal tissues: Craniocervical junction normal. Paraspinous and prevertebral soft tissues demonstrate no acute finding. Normal flow voids seen within the carotid and vertebral arteries bilaterally. Disc levels: C2-C3: Normal interspace. Mild left greater than right facet hypertrophy. No canal or foraminal stenosis. C3-C4: Mild disc bulge with uncovertebral hypertrophy. Superimposed small central disc protrusion indents the ventral thecal sac (series 10, image 17). No significant spinal stenosis or cord deformity. Moderate bilateral C4 foraminal stenosis. C4-C5: Central/right paracentral disc protrusion indents the ventral thecal sac, contacting and mildly flattening the ventral cord. No cord signal changes. Mild spinal stenosis. Mild to moderate bilateral C5 foraminal narrowing. C5-C6: Trace anterolisthesis. Degenerative intervertebral disc space narrowing with diffuse disc osteophyte complex, eccentric to the right.  Broad posterior component flattens and effaces the ventral thecal sac with resultant mild to moderate spinal stenosis. Minimal cord flattening without cord signal changes. Severe right with moderate left C6 foraminal narrowing. C6-C7: Degenerative intervertebral disc space narrowing with diffuse disc osteophyte complex, slightly eccentric to the right. Posterior component flattens and partially effaces the ventral thecal sac, greater on the right. Mild spinal stenosis without frank cord impingement. Moderate left C7 foraminal narrowing. Right neural foramina remains patent. C7-T1: Negative interspace.  Mild facet hypertrophy.  No stenosis. Visualized upper thoracic spine demonstrates no significant finding. IMPRESSION: 1. No acute abnormality within the cervical spine. 2. 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 3.  Multifactorial degenerative changes with resultant multilevel foraminal narrowing as above. Notable findings include moderate bilateral C4 foraminal stenosis, severe right with moderate left C6 foraminal narrowing, with moderate left C7 foraminal stenosis. Electronically Signed   By: Jeannine Boga M.D.   On: 08/22/2020 20:03   EEG adult  Result Date: 08/23/2020 Lora Havens, MD     08/23/2020 10:24 AM Patient Name: Aarn Blocker MRN: [id number] Epilepsy Attending: Lora Havens Referring Physician/Provider: Dr Lesleigh Noe Date: 08/23/2020 Duration: 25.51 mins Patient history: 70 year old male who presented with episode of tingling/numbness in left upper extremity. EEG to evaluate for seizures. Level of alertness: Awake, asleep AEDs during EEG study: None Technical aspects: This EEG study was done with scalp electrodes positioned according to the 10-20 International system of electrode placement. Electrical activity was acquired at a sampling rate of '500Hz'$  and reviewed with a high frequency filter of '70Hz'$  and a low frequency  filter of '1Hz'$ . EEG data were recorded continuously and digitally stored. Description: The posterior dominant rhythm consists of 8-9 Hz activity of moderate voltage (25-35 uV) seen predominantly in posterior head regions, symmetric and reactive to eye opening and eye closing. Sleep was characterized by vertex waves, sleep spindles (12 to 14 Hz), maximal frontocentral region.  Hyperventilation and photic stimulation were not performed.   IMPRESSION: This study is within normal limits. No seizures or epileptiform discharges were seen throughout the recording. Lora Havens   ECHOCARDIOGRAM COMPLETE  Result Date: 08/23/2020    ECHOCARDIOGRAM REPORT   Patient Name:   WASIF BURRUEL Date of Exam: 08/23/2020 Medical Rec #:  [id number]  Height:       73.0 in Accession #:    [id number] Weight:       246.0 lb Date of Birth:  Jan 10, 1951  BSA:          2.350 m Patient Age:    2 years   BP:           157/78 mmHg Patient Gender: M          HR:           77 bpm. Exam Location:  Inpatient Procedure: 2D Echo, Cardiac Doppler and Color Doppler Indications:    Stroke I63.9  History:        Patient has no prior history of Echocardiogram examinations.                 Carotid Disease.  Sonographer:    Tiffany Dance RVT Referring Phys: [id number] [place]  1. Left ventricular ejection fraction, by estimation, is 50 to 55%. The left ventricle has low normal function. The left ventricle has no regional wall motion abnormalities. Left ventricular diastolic parameters are consistent with Grade I diastolic dysfunction (impaired relaxation).  2. Right ventricular systolic function is normal. The right ventricular size is normal.  3. The mitral valve is normal in structure. No evidence of mitral valve regurgitation. No evidence of mitral stenosis.  4. The aortic valve is normal in structure. Aortic valve regurgitation is not visualized. No aortic stenosis is present.  5. The inferior vena cava is normal in size with greater  than 50% respiratory variability, suggesting right atrial pressure of 3 mmHg. Conclusion(s)/Recommendation(s): No intracardiac source of embolism detected on this transthoracic study. A transesophageal echocardiogram is recommended to exclude cardiac source of embolism if clinically indicated. FINDINGS  Left Ventricle: Left ventricular ejection fraction, by estimation, is 50 to 55%. The left ventricle has low normal function. The left  ventricle has no regional wall motion abnormalities. The left ventricular internal cavity size was normal in size. There is no left ventricular hypertrophy. Left ventricular diastolic parameters are consistent with Grade I diastolic dysfunction (impaired relaxation). Right Ventricle: The right ventricular size is normal. No increase in right ventricular wall thickness. Right ventricular systolic function is normal. Left Atrium: Left atrial size was normal in size. Right Atrium: Right atrial size was normal in size. Pericardium: There is no evidence of pericardial effusion. Mitral Valve: The mitral valve is normal in structure. No evidence of mitral valve regurgitation. No evidence of mitral valve stenosis. Tricuspid Valve: The tricuspid valve is normal in structure. Tricuspid valve regurgitation is not demonstrated. No evidence of tricuspid stenosis. Aortic Valve: The aortic valve is normal in structure. Aortic valve regurgitation is not visualized. No aortic stenosis is present. Pulmonic Valve: The pulmonic valve was normal in structure. Pulmonic valve regurgitation is not visualized. No evidence of pulmonic stenosis. Aorta: The aortic root is normal in size and structure. Venous: The inferior vena cava is normal in size with greater than 50% respiratory variability, suggesting right atrial pressure of 3 mmHg. IAS/Shunts: No atrial level shunt detected by color flow Doppler.   LV Volumes (MOD) LV vol d, MOD A2C: 112.0 ml Diastology LV vol d, MOD A4C: 85.1 ml  LV e' medial:    9.46  cm/s LV vol s, MOD A2C: 55.7 ml  LV E/e' medial:  7.6 LV vol s, MOD A4C: 49.9 ml  LV e' lateral:   8.38 cm/s LV SV MOD A2C:     56.3 ml  LV E/e' lateral: 8.6 LV SV MOD A4C:     85.1 ml LV SV MOD BP:      45.5 ml RIGHT VENTRICLE             IVC RV Basal diam:  3.10 cm     IVC diam: 1.90 cm RV S prime:     16.40 cm/s TAPSE (M-mode): 2.2 cm LEFT ATRIUM           Index       RIGHT ATRIUM           Index LA Vol (A2C): 76.5 ml 32.56 ml/m RA Area:     18.20 cm LA Vol (A4C): 34.4 ml 14.64 ml/m RA Volume:   48.50 ml  20.64 ml/m  AORTIC VALVE LVOT Vmax:   97.65 cm/s LVOT Vmean:  61.850 cm/s LVOT VTI:    0.190 m MITRAL VALVE MV Area (PHT): 4.06 cm    SHUNTS MV Decel Time: 187 msec    Systemic VTI: 0.19 m MV E velocity: 72.30 cm/s MV A velocity: 85.90 cm/s MV E/A ratio:  0.84 Candee Furbish MD Electronically signed by Candee Furbish MD Signature Date/Time: 08/23/2020/4:49:01 PM    Final       Subjective:   Discharge Exam: Vitals:   08/23/20 1223 08/23/20 1241  BP: (!) 168/107 (!) 157/92  Pulse: 60 67  Resp: (!) 22 19  Temp: 98 F (36.7 C) 98.2 F (36.8 C)  SpO2: 96% 96%   Vitals:   08/23/20 0715 08/23/20 1020 08/23/20 1223 08/23/20 1241  BP: (!) 157/78 (!) 177/84 (!) 168/107 (!) 157/92  Pulse: 64 72 60 67  Resp: 20 18 (!) 22 19  Temp: 98 F (36.7 C) 98 F (36.7 C) 98 F (36.7 C) 98.2 F (36.8 C)  TempSrc: Oral Oral Oral Oral  SpO2: 96% 95% 96% 96%    General: Pt is  alert, awake, not in acute distress Cardiovascular: RRR, S1/S2 +, no rubs, no gallops Respiratory: CTA bilaterally, no wheezing, no rhonchi Abdominal: Soft, NT, ND, bowel sounds + Extremities: no edema, no cyanosis    The results of significant diagnostics from this hospitalization (including imaging, microbiology, ancillary and laboratory) are listed below for reference.     Microbiology: Recent Results (from the past 240 hour(s))  Resp Panel by RT-PCR (Flu A&B, Covid) Nasopharyngeal Swab     Status: None   Collection  Time: 08/23/20 12:55 AM   Specimen: Nasopharyngeal Swab; Nasopharyngeal(NP) swabs in vial transport medium  Result Value Ref Range Status   SARS Coronavirus 2 by RT PCR NEGATIVE NEGATIVE Final    Comment: (NOTE) SARS-CoV-2 target nucleic acids are NOT DETECTED.  The SARS-CoV-2 RNA is generally detectable in upper respiratory specimens during the acute phase of infection. The lowest concentration of SARS-CoV-2 viral copies this assay can detect is 138 copies/mL. A negative result does not preclude SARS-Cov-2 infection and should not be used as the sole basis for treatment or other patient management decisions. A negative result may occur with  improper specimen collection/handling, submission of specimen other than nasopharyngeal swab, presence of viral mutation(s) within the areas targeted by this assay, and inadequate number of viral copies(<138 copies/mL). A negative result must be combined with clinical observations, patient history, and epidemiological information. The expected result is Negative.  Fact Sheet for Patients:  EntrepreneurPulse.com.au  Fact Sheet for Healthcare Providers:  IncredibleEmployment.be  This test is no t yet approved or cleared by the [place] FDA and  has been authorized for detection and/or diagnosis of SARS-CoV-2 by FDA under an Emergency Use Authorization (EUA). This EUA will remain  in effect (meaning this test can be used) for the duration of the COVID-19 declaration under Section 564(b)(1) of the Act, 21 U.S.C.section 360bbb-3(b)(1), unless the authorization is terminated  or revoked sooner.       Influenza A by PCR NEGATIVE NEGATIVE Final   Influenza B by PCR NEGATIVE NEGATIVE Final    Comment: (NOTE) The Xpert Xpress SARS-CoV-2/FLU/RSV plus assay is intended as an aid in the diagnosis of influenza from Nasopharyngeal swab specimens and should not be used as a sole basis for treatment. Nasal washings  and aspirates are unacceptable for Xpert Xpress SARS-CoV-2/FLU/RSV testing.  Fact Sheet for Patients: EntrepreneurPulse.com.au  Fact Sheet for Healthcare Providers: IncredibleEmployment.be  This test is not yet approved or cleared by the [place] FDA and has been authorized for detection and/or diagnosis of SARS-CoV-2 by FDA under an Emergency Use Authorization (EUA). This EUA will remain in effect (meaning this test can be used) for the duration of the COVID-19 declaration under Section 564(b)(1) of the Act, 21 U.S.C. section 360bbb-3(b)(1), unless the authorization is terminated or revoked.  Performed at [facility], [place] [address] [place], [place] [address]      Labs: BNP (last 3 results) No results for input(s): BNP in the last 8760 hours. Basic Metabolic Panel: Recent Labs  Lab 08/22/20 0038 08/22/20 0059  NA 141 142  K 4.0 4.6  CL 107 109  CO2 26  --   GLUCOSE 118* 111*  BUN 17 22  CREATININE 1.09 1.10  CALCIUM 9.1  --    Liver Function Tests: Recent Labs  Lab 08/22/20 0038  AST 23  ALT 13  ALKPHOS 56  BILITOT 0.7  PROT 6.5  ALBUMIN 3.7   No results for input(s): LIPASE, AMYLASE in the last 168  hours. No results for input(s): AMMONIA in the last 168 hours. CBC: Recent Labs  Lab 08/22/20 0038 08/22/20 0059  WBC 6.3  --   NEUTROABS 2.9  --   HGB 13.0 12.6*  HCT 39.2 37.0*  MCV 93.3  --   PLT 191  --    Cardiac Enzymes: No results for input(s): CKTOTAL, CKMB, CKMBINDEX, TROPONINI in the last 168 hours. BNP: Invalid input(s): POCBNP CBG: No results for input(s): GLUCAP in the last 168 hours. D-Dimer No results for input(s): DDIMER in the last 72 hours. Hgb A1c Recent Labs    08/23/20 0553  HGBA1C 4.9   Lipid Profile Recent Labs    08/23/20 0553  CHOL 202*  HDL 39*  LDLCALC 139*  TRIG 119  CHOLHDL 5.2   Thyroid function studies No results for input(s): TSH, T4TOTAL, T3FREE,  THYROIDAB in the last 72 hours.  Invalid input(s): FREET3 Anemia work up No results for input(s): VITAMINB12, FOLATE, FERRITIN, TIBC, IRON, RETICCTPCT in the last 72 hours. Urinalysis    Component Value Date/Time   COLORURINE YELLOW 08/22/2020 0320   APPEARANCEUR CLEAR 08/22/2020 0320   LABSPEC 1.025 08/22/2020 0320   PHURINE 5.0 08/22/2020 0320   GLUCOSEU NEGATIVE 08/22/2020 0320   HGBUR NEGATIVE 08/22/2020 0320   BILIRUBINUR NEGATIVE 08/22/2020 0320   KETONESUR NEGATIVE 08/22/2020 0320   PROTEINUR NEGATIVE 08/22/2020 0320   NITRITE NEGATIVE 08/22/2020 0320   LEUKOCYTESUR NEGATIVE 08/22/2020 0320   Sepsis Labs Invalid input(s): PROCALCITONIN,  WBC,  LACTICIDVEN Microbiology Recent Results (from the past 240 hour(s))  Resp Panel by RT-PCR (Flu A&B, Covid) Nasopharyngeal Swab     Status: None   Collection Time: 08/23/20 12:55 AM   Specimen: Nasopharyngeal Swab; Nasopharyngeal(NP) swabs in vial transport medium  Result Value Ref Range Status   SARS Coronavirus 2 by RT PCR NEGATIVE NEGATIVE Final    Comment: (NOTE) SARS-CoV-2 target nucleic acids are NOT DETECTED.  The SARS-CoV-2 RNA is generally detectable in upper respiratory specimens during the acute phase of infection. The lowest concentration of SARS-CoV-2 viral copies this assay can detect is 138 copies/mL. A negative result does not preclude SARS-Cov-2 infection and should not be used as the sole basis for treatment or other patient management decisions. A negative result may occur with  improper specimen collection/handling, submission of specimen other than nasopharyngeal swab, presence of viral mutation(s) within the areas targeted by this assay, and inadequate number of viral copies(<138 copies/mL). A negative result must be combined with clinical observations, patient history, and epidemiological information. The expected result is Negative.  Fact Sheet for Patients:   EntrepreneurPulse.com.au  Fact Sheet for Healthcare Providers:  IncredibleEmployment.be  This test is no t yet approved or cleared by the [place] FDA and  has been authorized for detection and/or diagnosis of SARS-CoV-2 by FDA under an Emergency Use Authorization (EUA). This EUA will remain  in effect (meaning this test can be used) for the duration of the COVID-19 declaration under Section 564(b)(1) of the Act, 21 U.S.C.section 360bbb-3(b)(1), unless the authorization is terminated  or revoked sooner.       Influenza A by PCR NEGATIVE NEGATIVE Final   Influenza B by PCR NEGATIVE NEGATIVE Final    Comment: (NOTE) The Xpert Xpress SARS-CoV-2/FLU/RSV plus assay is intended as an aid in the diagnosis of influenza from Nasopharyngeal swab specimens and should not be used as a sole basis for treatment. Nasal washings and aspirates are unacceptable for Xpert Xpress SARS-CoV-2/FLU/RSV testing.  Fact Sheet for Patients:  EntrepreneurPulse.com.au  Fact Sheet for Healthcare Providers: IncredibleEmployment.be  This test is not yet approved or cleared by the [place] FDA and has been authorized for detection and/or diagnosis of SARS-CoV-2 by FDA under an Emergency Use Authorization (EUA). This EUA will remain in effect (meaning this test can be used) for the duration of the COVID-19 declaration under Section 564(b)(1) of the Act, 21 U.S.C. section 360bbb-3(b)(1), unless the authorization is terminated or revoked.  Performed at [facility], [place] [address] [place], [place] [address]     Please note: You were cared for by a hospitalist during your hospital stay. Once you are discharged, your primary care physician will handle any further medical issues. Please note that NO REFILLS for any discharge medications will be authorized once you are discharged, as it is imperative that you return to your  primary care physician (or establish a relationship with a primary care physician if you do not have one) for your post hospital discharge needs so that they can reassess your need for medications and monitor your lab values.    Time coordinating discharge: 40 minutes  SIGNED:   Shelly Coss, MD  [facility] 08/24/2020, 3:53 PM Pager [phone]  If 7PM-7AM, please contact night-coverage [email or web address] Password [email or web address]

## 2020-08-23 NOTE — Progress Notes (Signed)
EEG complete - results pending 

## 2020-08-23 NOTE — ED Notes (Signed)
Patient transported to EEG

## 2020-08-23 NOTE — Progress Notes (Signed)
OT Cancellation Note  Patient Details Name: Samuel Serrano MRN: [id number] DOB: 1950-05-18   Cancelled Treatment:    Reason Eval/Treat Not Completed: OT screened, no needs identified, will sign off  Malka So 08/23/2020, 2:20 PM Nestor Lewandowsky, OTR/L Acute Rehabilitation Services Pager: [phone] Office: [phone]

## 2020-08-27 ENCOUNTER — Other Ambulatory Visit: Payer: Self-pay | Admitting: *Deleted

## 2020-08-27 NOTE — Patient Outreach (Signed)
[facility]) Care Management  08/27/2020  Conny Beri [birth year] [id number]   RED ON EMMI ALERT - Stroke Day # 1 Date: 8/15 Red Alert Reason: Not scheduled follow up   Outreach attempt #1, successful.  Identity verified.  This care manager introduced self and stated purpose of call.  [facility] care management services explained.    Report he is doing well, answered above noted question as he did during EMMI call due to being discharged on Friday, EMMI call was Monday.  He has since been scheduled with Neurology on 9/21 and has requested follow up appointments with cardiology and PCP through My Chart, awaiting appointment dates.  Denies any concern about medications, state blood pressure has returned back to "normal."  Monitoring and recording 3 times a day.  Denies any urgent concerns, encouraged to contact this care manager with questions.   Plan: RN CM will send outreach letter with this care manager's contact information, will follow up within the next 2 weeks.  Valente David, [place], MSN [place] [phone]

## 2020-09-02 ENCOUNTER — Other Ambulatory Visit: Payer: Self-pay | Admitting: *Deleted

## 2020-09-02 NOTE — Patient Outreach (Signed)
[place] [facility]) Care Management  09/02/2020  Samuel Serrano 1951/01/04 [id number]   RED ON EMMI ALERT Day #  Date:  Red Alert Reason:   Outreach attempt #1, successful.    State he had quit smoking but restarted a few weeks ago.  He has cut down, only smoking 1/2 pack a day, state he is trying to quit again.  Also taking Wellbutrin.  Member inquires about use of antihypertensives, blood pressure now 170's/100's. Advised per DC notes he should follow up with PCP for BP management.  He will call to schedule appointment.  Denies any urgent concerns, encouraged to contact this care manager with questions.    Plan: RN CM will follow up within the next week as planned.  Valente David, [place], MSN [place] [phone]

## 2020-09-10 ENCOUNTER — Other Ambulatory Visit: Payer: Self-pay | Admitting: *Deleted

## 2020-09-10 NOTE — Patient Outreach (Signed)
[place] [facility]) Care Management  09/10/2020  Samuel Serrano 1950-08-11 [id number]   RED ON EMMI ALERT - Stroke Day # 13 Date: 8/27 Red Alert Reason: Not had follow up appointment   Outreach attempt #1, successful.    Member had not had follow up appointment at the time of call because it was scheduled in future.  He has since attended his PCP appointment yesterday, provided with Lisinopril for BP concern.  Will continue to monitor BP several times a day and present trends for medication titration if needed.  Neurology appointment still to come in the further, 9/21.  Denies any urgent concerns, encouraged to contact this care manager with questions.    Plan: RN CM will close case at this time, nor further needs identified.  Member will call this care manager with any questions.  Samuel Serrano, [place], MSN [place] [phone]

## 2020-09-16 ENCOUNTER — Encounter: Payer: Self-pay | Admitting: Gastroenterology

## 2020-09-16 ENCOUNTER — Encounter: Payer: Self-pay | Admitting: Internal Medicine

## 2020-10-02 ENCOUNTER — Ambulatory Visit (INDEPENDENT_AMBULATORY_CARE_PROVIDER_SITE_OTHER): Payer: Medicare Other | Admitting: Adult Health

## 2020-10-02 ENCOUNTER — Encounter: Payer: Self-pay | Admitting: Adult Health

## 2020-10-02 VITALS — BP 131/79 | HR 67 | Ht 73.0 in | Wt 231.0 lb

## 2020-10-02 DIAGNOSIS — I6381 Other cerebral infarction due to occlusion or stenosis of small artery: Secondary | ICD-10-CM

## 2020-10-02 DIAGNOSIS — E785 Hyperlipidemia, unspecified: Secondary | ICD-10-CM

## 2020-10-02 DIAGNOSIS — I639 Cerebral infarction, unspecified: Secondary | ICD-10-CM

## 2020-10-02 DIAGNOSIS — I10 Essential (primary) hypertension: Secondary | ICD-10-CM | POA: Diagnosis not present

## 2020-10-02 MED ORDER — CLOPIDOGREL BISULFATE 75 MG PO TABS: 75.0000 mg | ORAL_TABLET | Freq: Every day | ORAL | 0 refills | Status: AC

## 2020-10-02 MED ORDER — ATORVASTATIN CALCIUM 40 MG PO TABS: 40.0000 mg | ORAL_TABLET | Freq: Every day | ORAL | 3 refills | Status: DC

## 2020-10-02 MED ORDER — ASPIRIN 325 MG PO TBEC: 325.0000 mg | DELAYED_RELEASE_TABLET | Freq: Every day | ORAL | 3 refills | Status: DC

## 2020-10-02 NOTE — Patient Instructions (Addendum)
Start therapies tomorrow for residual stroke symptoms - typically see greatest improvement for the first 3 months but can still see some after 6 months and even up to a year.  Your greatest chance of improving will be working with therapies  Please ensure evaluation with an eye doctor for concerns of decreased vision in left eye  Continue aspirin 325 mg daily and clopidogrel 75 mg daily  and atorvastatin 40mg  daily  for secondary stroke prevention  Continue to follow up with PCP regarding cholesterol and blood pressure management  Maintain strict control of hypertension with blood pressure goal below 130/90 and cholesterol with LDL cholesterol (bad cholesterol) goal below 70 mg/dL.    Please do not hesitate to reach out to the office if you have any questions or concerns regarding your stroke!      Thank you for coming to see [place] at [facility]. I hope we have been able to provide you high quality care today.  You may receive a patient satisfaction survey over the next few weeks. We would appreciate your feedback and comments so that we may continue to improve ourselves and the health of our patients.  Stroke Prevention Some medical conditions and behaviors are associated with a higher chance of having a stroke. You can help prevent a stroke by making nutrition, lifestyle, and other changes, including managing any medical conditions you may have. What nutrition changes can be made?  Eat healthy foods. You can do this by: Choosing foods high in fiber, such as fresh fruits and vegetables and whole grains. Eating at least 5 or more servings of fruits and vegetables a day. Try to fill half of your plate at each meal with fruits and vegetables. Choosing lean protein foods, such as lean cuts of meat, poultry without skin, fish, tofu, beans, and nuts. Eating low-fat dairy products. Avoiding foods that are high in salt (sodium). This can help lower blood pressure. Avoiding  foods that have saturated fat, trans fat, and cholesterol. This can help prevent high cholesterol. Avoiding processed and premade foods. Follow your health care provider's specific guidelines for losing weight, controlling high blood pressure (hypertension), lowering high cholesterol, and managing diabetes. These may include: Reducing your daily calorie intake. Limiting your daily sodium intake to 1,500 milligrams (mg). Using only healthy fats for cooking, such as olive oil, canola oil, or sunflower oil. Counting your daily carbohydrate intake. What lifestyle changes can be made? Maintain a healthy weight. Talk to your health care provider about your ideal weight. Get at least 30 minutes of moderate physical activity at least 5 days a week. Moderate activity includes brisk walking, biking, and swimming. Do not use any products that contain nicotine or tobacco, such as cigarettes and e-cigarettes. If you need help quitting, ask your health care provider. It may also be helpful to avoid exposure to secondhand smoke. Limit alcohol intake to no more than 1 drink a day for nonpregnant women and 2 drinks a day for men. One drink equals 12 oz of beer, 5 oz of wine, or 1 oz of hard liquor. Stop any illegal drug use. Avoid taking birth control pills. Talk to your health care provider about the risks of taking birth control pills if: You are over 61 years old. You smoke. You get migraines. You have ever had a blood clot. What other changes can be made? Manage your cholesterol levels. Eating a healthy diet is important for preventing high cholesterol. If cholesterol cannot be managed through diet alone,  you may also need to take medicines. Take any prescribed medicines to control your cholesterol as told by your health care provider. Manage your diabetes. Eating a healthy diet and exercising regularly are important parts of managing your blood sugar. If your blood sugar cannot be managed through diet  and exercise, you may need to take medicines. Take any prescribed medicines to control your diabetes as told by your health care provider. Control your hypertension. To reduce your risk of stroke, try to keep your blood pressure below 130/80. Eating a healthy diet and exercising regularly are an important part of controlling your blood pressure. If your blood pressure cannot be managed through diet and exercise, you may need to take medicines. Take any prescribed medicines to control hypertension as told by your health care provider. Ask your health care provider if you should monitor your blood pressure at home. Have your blood pressure checked every year, even if your blood pressure is normal. Blood pressure increases with age and some medical conditions. Get evaluated for sleep disorders (sleep apnea). Talk to your health care provider about getting a sleep evaluation if you snore a lot or have excessive sleepiness. Take over-the-counter and prescription medicines only as told by your health care provider. Aspirin or blood thinners (antiplatelets or anticoagulants) may be recommended to reduce your risk of forming blood clots that can lead to stroke. Make sure that any other medical conditions you have, such as atrial fibrillation or atherosclerosis, are managed. What are the warning signs of a stroke? The warning signs of a stroke can be easily remembered as BEFAST. B is for balance. Signs include: Dizziness. Loss of balance or coordination. Sudden trouble walking. E is for eyes. Signs include: A sudden change in vision. Trouble seeing. F is for face. Signs include: Sudden weakness or numbness of the face. The face or eyelid drooping to one side. A is for arms. Signs include: Sudden weakness or numbness of the arm, usually on one side of the body. S is for speech. Signs include: Trouble speaking (aphasia). Trouble understanding. T is for time. These symptoms may represent a serious  problem that is an emergency. Do not wait to see if the symptoms will go away. Get medical help right away. Call your local emergency services (911 in the [place] Do not drive yourself to the hospital. Other signs of stroke may include: A sudden, severe headache with no known cause. Nausea or vomiting. Seizure. Where to find more information For more information, visit: American Stroke Association: www.strokeassociation.org National Stroke Association: www.stroke.org Summary You can prevent a stroke by eating healthy, exercising, not smoking, limiting alcohol intake, and managing any medical conditions you may have. Do not use any products that contain nicotine or tobacco, such as cigarettes and e-cigarettes. If you need help quitting, ask your health care provider. It may also be helpful to avoid exposure to secondhand smoke. Remember BEFAST for warning signs of stroke. Get help right away if you or a loved one has any of these signs. This information is not intended to replace advice given to you by your health care provider. Make sure you discuss any questions you have with your health care provider. Document Revised: 12/11/2016 Document Reviewed: 02/04/2016 Elsevier Patient Education  2021 [place] After Stroke Your emotional health may change after a stroke. You may have fear, anxiety, and other feelings. Some of these changes happen because a stroke can damage your brain and nervous system. You  may also have these feelings because coping with a change in your health can feel overwhelming. Depression and other emotional changes can slow your recovery after a stroke. It is important to recognize the symptoms so that you can take steps to strengthen your emotional health. What are some common emotions after a stroke? You may feel: Fear. Anxiety. Anger. Frustration. Sadness. Loss or grief. You may cry or laugh at the wrong time or in the wrong situation  (pseudobulbar affect, orPBA). How to recognize symptoms of depression Depression is common after a stroke. It can happen right away, or it can show up later. Symptoms of depression may include: Physical changes, such as: Sleep problems. Weight gain or weight loss. Not having energy or enthusiasm (lethargy). Feeling very tired (fatigue). Emotional changes, such as: Irritability. Crying more than usual. Mood swings. Feeling hopeless. Hating yourself. Having suicidal thoughts. Behavioral changes such as: Avoiding people and activities (social withdrawal). Not being able to concentrate. Changes in eating habits, such as eating too much or too little. What increases my risk of depression? Having a stroke raises the risk of depression. The risk also goes up if you: Are socially isolated. Have a family history of depression. Have a personal history of depression or other mental health problems before the stroke. Are unable to work or do activities that you previously enjoyed. Feel dependent on others for daily activities. Use drugs or alcohol. What are some coping methods I can use? Ask your health care provider for help. He or she may recommend treatments for depression, such as: Counseling with a mental health professional to help with depression and stroke recovery. Medical therapies, such as brain stimulation or light therapy. Lifestyle changes, such as eating a healthy diet and avoiding alcohol. Antidepressant medicines. Alternative therapies, such as meditation, acupuncture, music therapy, or pet therapy. Other coping strategies may include: Physical therapy or exercises every day to help you with movement. Writing your thoughts in a journal. An example might be keeping track of negative thoughts or keeping a log of things you feel grateful for. Practicing good sleep habits, such as going to bed and getting up at the same time every day. Following a routine each  day. Participating in activities that make you laugh. Mindfulness therapy. This may include meditation and other techniques to lower your stress. Joining a support group for people who are recovering from a stroke. These groups provide social interaction and help you feel connected to others. Your health care team can help you find a support group in your area. Where to find more information American Stroke Association: www.stroke.org Contact a health care provider if: You have emotions and feelings that deepen and are cause for concern. Get help right away if: You feel hopeless. You have thoughts of hurting yourself or someone else. You feel suicidal. If you ever feel like you may hurt yourself or others, or have thoughts about taking your own life, get help right away. Go to your nearest emergency department or: Call your local emergency services (911 in the [place] Call a suicide crisis helpline, such as the [place] at 321-110-9200. This is open 24 hours a day. Text the Crisis Text Line at 209-139-7345 (in the [place].). Summary Your emotional health may change after a stroke. You may have an increase in fear, anxiety, anger, sadness or other feelings. It is important to recognize the symptoms of depression and other emotional problems and to seek help from professionals. Lifestyle changes, counseling, physical therapy,  and exercising can help you cope with the effects of stroke and help you stay as emotionally healthy as possible. This information is not intended to replace advice given to you by your health care provider. Make sure you discuss any questions you have with your health care provider. Document Revised: 12/16/2019 Document Reviewed: 12/16/2019 Elsevier Patient Education  2022 [employer].

## 2020-10-02 NOTE — Progress Notes (Signed)
[facility] [address] [place] [address] [phone]       HOSPITAL FOLLOW UP NOTE  Samuel Serrano Date of Birth:  11-14-1950 Medical Record Number:  [id number]   Reason for Referral:  hospital stroke follow up    SUBJECTIVE:   CHIEF COMPLAINT:  Chief Complaint  Patient presents with   Follow-up    RM 2 alone Pt is well, having issues finding and getting his words out, occasional dizziness and easily irritable.     HPI:   Samuel Serrano is a 70 year old male with history of hypertension, PVD, left CEA 2018, smoker, throat cancer status post surgery in 05/2020 admitted on 08/22/2020 for left arm numbness and tingling, waxing and waning.  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  MRI showed subacute right thalamic infarct.  CTA head and neck right P2 severe stenosis, right ICA 50% stenosis.  MRI with contrast showed thinning enhancement at the periphery of medial right thalamus, more likely due to subacute stroke, not consistent with metastasis.  EF 50 to 55%, A1c 4.9, LDL 139.  Creatinine 1.10.  EEG normal.  Evaluated by Dr. Erlinda Hong, stroke etiology likely due to large vessel disease given severe right P2 stenosis.  Recommended DAPT for 3 months and aspirin alone as well as initiating atorvastatin 40 mg daily.  Smoking cessation education provided.  PT/OT no recommendations with resolution of LUE numbness.   Today, 10/02/2020, Samuel Serrano for hospital follow-up unaccompanied.  Since his stroke, Complains of decreased left eye vision - not as sharp - denies any issues prior to stroke  Difficulty finding correct words - knows what he wants to say but cannot get the word out  Dizziness occasionally - can occur randomly - worsens with increased exertion  Imbalance - afraid he will fall. If focusing on walking, wont happen. When not focusing, he will veer towards the left  Easily irritable - denies depression/anxiety - he did start  bupropion 150mg  BID beginning of September with plans on quitting tobacco use on 10/1   Denies new stroke/TIA symptoms  Since discharge, he has establish care with [place] PCP on 9/19 Scheduled video visit with OT/PT/SLP tomorrow (?thru [place]) Has been referred to mental health integration for mood changes Referred to ophtho for vision concerns Referred to do sleep consult - plans to do HST first  Lab work completed Tuesday (unable to view results)  Blood pressure today 131/79. Brings BP log to visit today which has been ranging 150-170s/70-90s.  PCP increased lisinopriol from 10 mg daily to 20mg  on Monday with some improvement of pressures  No further concerns at this time     [place] 08/23/2020 IMPRESSION: Thin enhancement at the periphery of medial right thalamic signal abnormality. This is unlikely to represent a metastasis but follow-up study without and with contrast is recommended after several weeks.  MR BRAIN WO CONTRAST 08/22/2020 IMPRESSION: 1. 1.9 cm focus of diffusion signal abnormality involving the medial right thalamus, favored to reflect an evolving subacute ischemic infarct. No associated hemorrhage or mass effect. A short interval follow-up MRI to ensure these changes resolve is recommended, particularly given the patient history of malignancy, as an underlying neoplasm could conceivably have this appearance as well. 2. No other acute intracranial abnormality. 3. Moderately advanced cerebral and cerebellar atrophy with chronic small vessel ischemic disease. Superimposed small remote left parietal infarct.  CTA HEAD/NECK 08/23/2020 IMPRESSION: 1. Negative CTA for large  vessel occlusion. 2. Short-segment severe proximal right P2 stenosis. Right PCA attenuated but patent distally. Finding is in keeping with the previously identified subacute right thalamic infarct. 3. 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 4. Prior left  carotid endarterectomy without residual or recurrent stenosis. 5. No other hemodynamically significant or correctable stenosis within the major arterial vasculature of the head and neck. 6. Postoperative changes from recent tumor resection and lymph node dissection within the neck. 7. Emphysema (ICD10-J43.9).        ROS:   14 system review of systems performed and negative with exception of those listed in HPI  PMH:  Past Medical History:  Diagnosis Date   Cancer ([place])    Carotid artery occlusion    Peripheral vascular disease ([facility])    left carotid stenosis   Throat cancer (H[facility])     PSH:  Past Surgical History:  Procedure Laterality Date   COLONOSCOPY     CYST EXCISION     benign cysts in breasts at age 14   ENDARTERECTOMY Left 07/24/2016   Procedure: Left Carotid ENDARTERECTOMY;  Surgeon: Rosetta Posner, MD;  Location: [facility] OR;  Service: Vascular;  Laterality: Left;   PATCH ANGIOPLASTY Left 07/24/2016   Procedure: PATCH ANGIOPLASTY;  Surgeon: Rosetta Posner, MD;  Location: [facility] OR;  Service: Vascular;  Laterality: Left;   throat cancer surgery     WISDOM TOOTH EXTRACTION      Social History:  Social History   Socioeconomic History   Marital status: Married    Spouse name: Not on file   Number of children: Not on file   Years of education: Not on file   Highest education level: Not on file  Occupational History   Not on file  Tobacco Use   Smoking status: Every Day    Packs/day: 0.00    Types: Cigarettes   Smokeless tobacco: Never   Tobacco comments:    1 1/4 pk per day  Vaping Use   Vaping Use: Never used  Substance and Sexual Activity   Alcohol use: Yes    Comment: occasional   Drug use: No   Sexual activity: Yes  Other Topics Concern   Not on file  Social History Narrative   Not on file   Social Determinants of Health   Financial Resource Strain: Not on file  Food Insecurity: Not on file  Transportation Needs: Not on file  Physical Activity:  Not on file  Stress: Not on file  Social Connections: Not on file  Intimate Partner Violence: Not on file    Family History:  Family History  Problem Relation Age of Onset   Breast cancer Mother    Diabetes Father    Hypertension Father    Colon cancer Neg Hx    Esophageal cancer Neg Hx    Liver cancer Neg Hx    Pancreatic cancer Neg Hx    Rectal cancer Neg Hx    Stomach cancer Neg Hx     Medications:   Current Outpatient Medications on File Prior to Visit  Medication Sig Dispense Refill   aspirin EC 325 MG EC tablet Take 1 tablet (325 mg total) by mouth daily. 30 tablet 2   atorvastatin (LIPITOR) 40 MG tablet Take 1 tablet (40 mg total) by mouth daily. 30 tablet 2   buPROPion (WELLBUTRIN SR) 150 MG 12 hr tablet Start one week before quit date. Take 1 tab daily x 3 days, then 1 tab BID thereafter. (Patient  taking differently: Take 150 mg by mouth 2 (two) times daily. Start one week before quit date.) 60 tablet 2   clopidogrel (PLAVIX) 75 MG tablet Take 1 tablet (75 mg total) by mouth daily. 30 tablet 2   hydrOXYzine (ATARAX/VISTARIL) 25 MG tablet Take 100 mg by mouth at bedtime.     lisinopril (ZESTRIL) 20 MG tablet Take 20 mg by mouth daily.     naproxen sodium (ALEVE) 220 MG tablet Take 220 mg by mouth daily as needed (pain).     Current Facility-Administered Medications on File Prior to Visit  Medication Dose Route Frequency Provider Last Rate Last Admin   0.9 %  sodium chloride infusion  500 mL Intravenous Once Pyrtle, Lajuan Lines, MD        Allergies:   Allergies  Allergen Reactions   No Known Allergies       OBJECTIVE:  Physical Exam  Vitals:   10/02/20 0801  BP: 131/79  Pulse: 67  Weight: 231 lb (104.8 kg)  Height: 6\' 1"  (1.854 m)   Body mass index is 30.48 kg/m. No results found.  General: well developed, well nourished, pleasant elderly Caucasian male, seated, in no evident distress Head: head normocephalic and atraumatic.   Neck: supple with no carotid  or supraclavicular bruits Cardiovascular: regular rate and rhythm, no murmurs Musculoskeletal: no deformity Skin:  no rash/petichiae Vascular:  Normal pulses all extremities   Neurologic Exam Mental Status: Awake and fully alert.  Fluent speech and language during visit.  Oriented to place and time. Recent and remote memory intact. Attention span, concentration and fund of knowledge appropriate. Mood and affect appropriate.  Cranial Nerves: Fundoscopic exam reveals sharp disc margins. Pupils equal, briskly reactive to light. Extraocular movements full without nystagmus. Visual fields full to confrontation. Hearing intact. Facial sensation intact. Face, tongue, palate moves normally and symmetrically.  Motor: Normal bulk and tone. Normal strength in all tested extremity muscles Sensory.: intact to touch , pinprick , position and vibratory sensation.  Coordination: Rapid alternating movements normal in all extremities. Finger-to-nose and heel-to-shin performed accurately bilaterally. Gait and Station: Arises from chair without difficulty. Stance is normal. Gait demonstrates normal stride length and balance while walking straight ahead difficulty with turns becoming off  balance and leaning towards left.  Able to heel toe with mild difficulty.  Able to perform tandem walk with some difficulty but unable to initially do when starting with left leg Reflexes: 1+ and symmetric. Toes downgoing.     NIHSS  0 Modified Rankin  2      ASSESSMENT: Samuel Serrano is a 70 y.o. year old male with recent right thalamic infarct likely secondary to large vessel disease with severe right P2 stenosis on 08/22/2020 after presenting with waxing and waning of left arm numbness/tingling. Vascular risk factors include HTN, HLD, hx L CEA 2018, R ICA 50% stenosis, tobacco use and throat cancer s/p surgery 05/2020.      PLAN:  Right thalamic stroke:  Residual deficit: Gait impairment with imbalance as well subjective  word finding difficulty, decreased vision left eye and mood changes.  Plans to start therapies tomorrow.  PCP has since made appropriate referrals for ophthalmology eval, mental health, therapies and sleep study Continue aspirin 325 mg daily and clopidogrel 75 mg daily for total of 3 months then aspirin alone and continue atorvastatin 40 mg daily for secondary stroke prevention.  Refills provided as requested per request ongoing refills per PCP or [facility] Discussed secondary stroke prevention measures and  importance of close PCP follow up for aggressive stroke risk factor management. I have gone over the pathophysiology of stroke, warning signs and symptoms, risk factors and their management in some detail with instructions to go to the closest emergency room for symptoms of concern. HTN: BP goal <130/90.  Uncontrolled although gradually improving with recent BP med adjustment per PCP HLD: LDL goal <70. Recent LDL 139.  Continue atorvastatin 40 mg daily. Recent labs by [facility] PCP - unable to view via epic Carotid stenosis: s/p L CEA 2018, R ICA 50% stenosis on recent imaging.  Routinely monitored by PCP per patient with yearly f/u ultrasounds    [facility] therefore can follow-up here as needed but advised to call office with any questions or concerns during the interval time   CC:  [place] provider: Dr. Leonie Man PCP: Nicholes Rough, PA-C    I spent 56 minutes of face-to-face and non-face-to-face time with patient.  This included previsit chart review including review of recent hospitalization, lab review, study review, order entry, electronic health record documentation, patient education regarding recent stroke including etiology, secondary stroke prevention measures and importance of managing stroke risk factors, residual deficits and typical recovery time and answered all other questions to patient satisfaction  Frann Rider, AGNP-BC  [facility] [address] [place], [place] [address]  Phone [phone] Fax [phone] Note: This document was prepared with digital dictation and possible smart phrase technology. Any transcriptional errors that result from this process are unintentional.

## 2020-10-03 NOTE — Progress Notes (Signed)
I agree with the above plan 

## 2021-01-06 ENCOUNTER — Other Ambulatory Visit: Payer: Self-pay | Admitting: Adult Health

## 2022-01-21 ENCOUNTER — Emergency Department (HOSPITAL_COMMUNITY): Payer: Medicare Other

## 2022-01-21 ENCOUNTER — Emergency Department (HOSPITAL_COMMUNITY)
Admission: EM | Admit: 2022-01-21 | Discharge: 2022-01-21 | Disposition: A | Discharge status: Home / Self Care | Payer: Medicare Other | Attending: Emergency Medicine | Admitting: Emergency Medicine

## 2022-01-21 ENCOUNTER — Encounter (HOSPITAL_COMMUNITY): Payer: Self-pay

## 2022-01-21 ENCOUNTER — Other Ambulatory Visit: Payer: Self-pay

## 2022-01-21 DIAGNOSIS — Z85818 Personal history of malignant neoplasm of other sites of lip, oral cavity, and pharynx: Secondary | ICD-10-CM | POA: Diagnosis not present

## 2022-01-21 DIAGNOSIS — J069 Acute upper respiratory infection, unspecified: Secondary | ICD-10-CM

## 2022-01-21 DIAGNOSIS — I10 Essential (primary) hypertension: Secondary | ICD-10-CM | POA: Diagnosis not present

## 2022-01-21 DIAGNOSIS — R531 Weakness: Secondary | ICD-10-CM | POA: Diagnosis not present

## 2022-01-21 DIAGNOSIS — R42 Dizziness and giddiness: Secondary | ICD-10-CM

## 2022-01-21 DIAGNOSIS — Z20822 Contact with and (suspected) exposure to covid-19: Secondary | ICD-10-CM | POA: Diagnosis not present

## 2022-01-21 DIAGNOSIS — R0602 Shortness of breath: Secondary | ICD-10-CM | POA: Insufficient documentation

## 2022-01-21 DIAGNOSIS — Z7982 Long term (current) use of aspirin: Secondary | ICD-10-CM | POA: Diagnosis not present

## 2022-01-21 DIAGNOSIS — R410 Disorientation, unspecified: Secondary | ICD-10-CM | POA: Diagnosis not present

## 2022-01-21 DIAGNOSIS — R0600 Dyspnea, unspecified: Secondary | ICD-10-CM | POA: Insufficient documentation

## 2022-01-21 DIAGNOSIS — Z79899 Other long term (current) drug therapy: Secondary | ICD-10-CM | POA: Insufficient documentation

## 2022-01-21 DIAGNOSIS — R202 Paresthesia of skin: Secondary | ICD-10-CM | POA: Diagnosis not present

## 2022-01-21 DIAGNOSIS — R059 Cough, unspecified: Secondary | ICD-10-CM | POA: Diagnosis present

## 2022-01-21 HISTORY — DX: Cerebral infarction, unspecified: I63.9

## 2022-01-21 LAB — DIFFERENTIAL
Abs Immature Granulocytes: 0.03 10*3/uL (ref 0.00–0.07)
Basophils Absolute: 0.1 10*3/uL (ref 0.0–0.1)
Basophils Relative: 1 %
Eosinophils Absolute: 0.3 10*3/uL (ref 0.0–0.5)
Eosinophils Relative: 6 %
Immature Granulocytes: 1 %
Lymphocytes Relative: 27 %
Lymphs Abs: 1.6 10*3/uL (ref 0.7–4.0)
Monocytes Absolute: 0.6 10*3/uL (ref 0.1–1.0)
Monocytes Relative: 10 %
Neutro Abs: 3.3 10*3/uL (ref 1.7–7.7)
Neutrophils Relative %: 55 %

## 2022-01-21 LAB — URINALYSIS, ROUTINE W REFLEX MICROSCOPIC
Bilirubin Urine: NEGATIVE
Glucose, UA: NEGATIVE mg/dL
Hgb urine dipstick: NEGATIVE
Ketones, ur: NEGATIVE mg/dL
Leukocytes,Ua: NEGATIVE
Nitrite: NEGATIVE
Protein, ur: NEGATIVE mg/dL
Specific Gravity, Urine: 1.009 (ref 1.005–1.030)
pH: 5 (ref 5.0–8.0)

## 2022-01-21 LAB — CBC
HCT: 39.6 % (ref 39.0–52.0)
Hemoglobin: 12.9 g/dL — ABNORMAL LOW (ref 13.0–17.0)
MCH: 31.1 pg (ref 26.0–34.0)
MCHC: 32.6 g/dL (ref 30.0–36.0)
MCV: 95.4 fL (ref 80.0–100.0)
Platelets: 194 10*3/uL (ref 150–400)
RBC: 4.15 MIL/uL — ABNORMAL LOW (ref 4.22–5.81)
RDW: 12.5 % (ref 11.5–15.5)
WBC: 5.8 10*3/uL (ref 4.0–10.5)
nRBC: 0 % (ref 0.0–0.2)

## 2022-01-21 LAB — COMPREHENSIVE METABOLIC PANEL
ALT: 22 U/L (ref 0–44)
AST: 23 U/L (ref 15–41)
Albumin: 4.1 g/dL (ref 3.5–5.0)
Alkaline Phosphatase: 41 U/L (ref 38–126)
Anion gap: 7 (ref 5–15)
BUN: 17 mg/dL (ref 8–23)
CO2: 28 mmol/L (ref 22–32)
Calcium: 9.3 mg/dL (ref 8.9–10.3)
Chloride: 102 mmol/L (ref 98–111)
Creatinine, Ser: 1.36 mg/dL — ABNORMAL HIGH (ref 0.61–1.24)
GFR, Estimated: 55 mL/min — ABNORMAL LOW (ref >60)
Glucose, Bld: 110 mg/dL — ABNORMAL HIGH (ref 70–99)
Potassium: 4.4 mmol/L (ref 3.5–5.1)
Sodium: 137 mmol/L (ref 135–145)
Total Bilirubin: 1.1 mg/dL (ref 0.3–1.2)
Total Protein: 6.9 g/dL (ref 6.5–8.1)

## 2022-01-21 LAB — RESP PANEL BY RT-PCR (RSV, FLU A&B, COVID)  RVPGX2
Influenza A by PCR: NEGATIVE
Influenza B by PCR: NEGATIVE
Resp Syncytial Virus by PCR: NEGATIVE
SARS Coronavirus 2 by RT PCR: NEGATIVE

## 2022-01-21 LAB — I-STAT CHEM 8, ED
BUN: 19 mg/dL (ref 8–23)
Calcium, Ion: 1.17 mmol/L (ref 1.15–1.40)
Chloride: 101 mmol/L (ref 98–111)
Creatinine, Ser: 1.3 mg/dL — ABNORMAL HIGH (ref 0.61–1.24)
Glucose, Bld: 106 mg/dL — ABNORMAL HIGH (ref 70–99)
HCT: 39 % (ref 39.0–52.0)
Hemoglobin: 13.3 g/dL (ref 13.0–17.0)
Potassium: 4.3 mmol/L (ref 3.5–5.1)
Sodium: 139 mmol/L (ref 135–145)
TCO2: 28 mmol/L (ref 22–32)

## 2022-01-21 LAB — RAPID URINE DRUG SCREEN, HOSP PERFORMED
Amphetamines: NOT DETECTED
Barbiturates: NOT DETECTED
Benzodiazepines: NOT DETECTED
Cocaine: NOT DETECTED
Opiates: NOT DETECTED
Tetrahydrocannabinol: NOT DETECTED

## 2022-01-21 LAB — TROPONIN I (HIGH SENSITIVITY)
Troponin I (High Sensitivity): 6 ng/L (ref <18)
Troponin I (High Sensitivity): 7 ng/L (ref <18)

## 2022-01-21 LAB — ETHANOL: Alcohol, Ethyl (B): <10 mg/dL (ref <10)

## 2022-01-21 LAB — PROTIME-INR
INR: 1.1 (ref 0.8–1.2)
Prothrombin Time: 13.7 seconds (ref 11.4–15.2)

## 2022-01-21 LAB — APTT: aPTT: 31 seconds (ref 24–36)

## 2022-01-21 MED ORDER — MECLIZINE HCL 25 MG PO TABS: 25.0000 mg | ORAL_TABLET | Freq: Three times a day (TID) | ORAL | 0 refills | Status: DC | PRN

## 2022-01-21 MED ORDER — LORAZEPAM 1 MG PO TABS: 1.0000 mg | ORAL_TABLET | Freq: Once | ORAL | Status: DC | PRN

## 2022-01-21 MED ORDER — IOHEXOL 350 MG/ML SOLN
75.0000 mL | Freq: Once | INTRAVENOUS | Status: AC | PRN
  Administered 2022-01-21: 75 mL via INTRAVENOUS

## 2022-01-21 MED ORDER — LACTATED RINGERS IV BOLUS
500.0000 mL | Freq: Once | INTRAVENOUS | Status: AC
  Administered 2022-01-21: 500 mL via INTRAVENOUS

## 2022-01-21 MED ORDER — MECLIZINE HCL 25 MG PO TABS
25.0000 mg | ORAL_TABLET | Freq: Once | ORAL | Status: AC
  Administered 2022-01-21: 25 mg via ORAL
  Filled 2022-01-21: qty 1

## 2022-01-21 NOTE — Discharge Instructions (Addendum)
Suspect your dizziness is related to prior stroke, dehydration, potentially also inner ear vertigo in setting of upper respiratory symptoms.  Recommend continuing to stay hydrated, can take the meclizine as needed for dizziness.  You do not have a blood clot on your CT scan, however it does look like your artery from your heart to your lungs is enlarged. This can be due to underlying lung disease (like COPD it looks like you have) or other causes.  Recommend follow up to evaluate for valves, and heart with your PCP and referral to pulmonology/cardiology as indicated by your primary provider.

## 2022-01-21 NOTE — ED Provider Notes (Signed)
Samuel Serrano Provider Note   CSN: [id number] Arrival date & time: 01/21/22  1148     History {Add pertinent medical, surgical, social history, OB history to HPI:1} No chief complaint on file.   Samuel Serrano is a 72 y.o. male.  HPI     8 male with a history of peripheral vascular disease, carotid artery occlusion, history of throat cancer status postsurgery, CVA with left upper extremity numbness, hypertension   Had reported numbness to lips and bilateral hands but normal sensory perception with symptoms beginning around 6:30 AM today.  Has had a URI since Monday, cough, congestion, (went to work Monday, then home sick, felt dizzy--room spinning, would close eyes and room spinning) felt generalized weakness.  Reported that he had bilateral lower extremity weakness over the last few days that seem to worsen this morning.  Since stroke has had dizziness but this has been more severe, was rubbing walls everywhere walked, feeling off balance, worse with head turning. When had PT after the stroke had this type of dizziness.  It improved for a long time until this Monday, today was worse.  Off balance, dizzy. Didn't feel numb or weak. Has funny feel in bialteral hands, numbness, tingling.   No difficulty talking. Having confusion today. No change in vision.   Felt like eyes crossing at times.  Dyspnea today, felt it this AM.  Some yesterday too with the coughing which improved today. No chest pain.    No nv, diarrhea, black or bloody stools Hx of siatica, bilateral lower ext pain,  was having problem with swelling in the past but improved and not worse recently No hx of blood clots in legs or lungs  No long trips, recent surgeries No fever  Cough, runny nose, no sick contacts No new medications, mucinex yesterday Gabapentin wasn't helping so switched to lyrica at the [place], took one dose that evening and 2 the next day and was loopy about 3 weeks ago,  stopped that and got back on gabapentin. Did take bp medication early today.     Past Medical History:  Diagnosis Date   Cancer [facility])    Carotid artery occlusion    Peripheral vascular disease (HCC)    left carotid stenosis   Stroke ([place])    Throat cancer ([place])      Home Medications Prior to Admission medications   Medication Sig Start Date End Date Taking? Authorizing Provider  aspirin 325 MG EC tablet Take 1 tablet (325 mg total) by mouth daily. 10/02/20   Frann Rider, NP  atorvastatin (LIPITOR) 40 MG tablet Take 1 tablet (40 mg total) by mouth daily. 10/02/20   Frann Rider, NP  buPROPion (WELLBUTRIN SR) 150 MG 12 hr tablet Start one week before quit date. Take 1 tab daily x 3 days, then 1 tab BID thereafter. Patient taking differently: Take 150 mg by mouth 2 (two) times daily. Start one week before quit date. 05/06/20   Eppie Gibson, MD  hydrOXYzine (ATARAX/VISTARIL) 25 MG tablet Take 100 mg by mouth at bedtime.    [provider]  lisinopril (ZESTRIL) 20 MG tablet Take 20 mg by mouth daily. 09/09/20   [provider]  naproxen sodium (ALEVE) 220 MG tablet Take 220 mg by mouth daily as needed (pain).    [provider]      Allergies    No known allergies    Review of Systems   Review of Systems  Physical Exam Updated Vital  Signs BP 128/73   Pulse (!) 57   Temp (!) 97.5 F (36.4 C)   Resp 18   Ht '6\' 1"'$  (1.854 m)   Wt 102.1 kg   SpO2 98%   BMI 29.69 kg/m  Physical Exam Vitals and nursing note reviewed.  Constitutional:      General: He is not in acute distress.    Appearance: He is well-developed. He is not diaphoretic.  HENT:     Head: Normocephalic and atraumatic.  Eyes:     Conjunctiva/sclera: Conjunctivae normal.  Cardiovascular:     Rate and Rhythm: Normal rate and regular rhythm.     Heart sounds: Normal heart sounds. No murmur heard.    No friction rub. No gallop.  Pulmonary:     Effort: Pulmonary effort is normal. No  respiratory distress.     Breath sounds: Normal breath sounds. No wheezing or rales.  Abdominal:     General: There is no distension.     Palpations: Abdomen is soft.     Tenderness: There is no abdominal tenderness. There is no guarding.  Musculoskeletal:     Cervical back: Normal range of motion.  Skin:    General: Skin is warm and dry.  Neurological:     Mental Status: He is alert and oriented to person, place, and time.     ED Results / Procedures / Treatments   Labs (all labs ordered are listed, but only abnormal results are displayed) Labs Reviewed  CBC - Abnormal; Notable for the following components:      Result Value   RBC 4.15 (*)    Hemoglobin 12.9 (*)    All other components within normal limits  COMPREHENSIVE METABOLIC PANEL - Abnormal; Notable for the following components:   Glucose, Bld 110 (*)    Creatinine, Ser 1.36 (*)    GFR, Estimated 55 (*)    All other components within normal limits  I-STAT CHEM 8, ED - Abnormal; Notable for the following components:   Creatinine, Ser 1.30 (*)    Glucose, Bld 106 (*)    All other components within normal limits  RESP PANEL BY RT-PCR (RSV, FLU A&B, COVID)  RVPGX2  ETHANOL  PROTIME-INR  APTT  DIFFERENTIAL  RAPID URINE DRUG SCREEN, HOSP PERFORMED  URINALYSIS, ROUTINE W REFLEX MICROSCOPIC  TROPONIN I (HIGH SENSITIVITY)  TROPONIN I (HIGH SENSITIVITY)    EKG EKG Interpretation  Date/Time:  Wednesday January 21 2022 13:12:34 EST Ventricular Rate:  61 PR Interval:  176 QRS Duration: 158 QT Interval:  500 QTC Calculation: 503 R Axis:   122 Text Interpretation: Normal sinus rhythm Right bundle branch block Left posterior fascicular block ** Bifascicular block ** Abnormal ECG When compared with ECG of 22-Aug-2020 12:46, bifascicular block is new Confirmed by Gareth Morgan [phone]) on 01/21/2022 4:22:38 PM  Radiology MR BRAIN WO CONTRAST  Result Date: 01/21/2022 CLINICAL DATA:  Bilateral lower extremity weakness.  EXAM: MRI HEAD WITHOUT CONTRAST TECHNIQUE: Multiplanar, multiecho pulse sequences of the brain and surrounding structures were obtained without intravenous contrast. COMPARISON:  Same-day CT head, brain MRI 08/23/2020 FINDINGS: Brain: There is no acute intracranial hemorrhage, extra-axial fluid collection, or acute infarct. Moderate background parenchymal volume loss with prominence of the ventricular system and extra-axial CSF spaces is again seen. The ventricles are stable in size compared to the brain MRI from 2022. Patchy FLAIR signal abnormality in the supratentorial white matter likely reflects sequela of mild to moderate chronic small-vessel ischemic change. There is linear T2  hyperintensity in the right thalamus consistent with prior infarct. There is a small remote infarct in the pons, new since 2022. There is a small area of encephalomalacia and gliosis in the left parietal lobe, unchanged. Chronic hemosiderin staining is also unchanged. The pituitary and suprasellar region are normal. There is no mass lesion. There is no mass effect or midline shift. Vascular: Normal flow voids. Skull and upper cervical spine: Normal marrow signal. Sinuses/Orbits: There is mild mucosal thickening in the paranasal sinuses. The globes and orbits are unremarkable. Other: None. IMPRESSION: 1. No acute intracranial pathology. 2. Background parenchymal volume loss and small remote infarcts as above. Electronically Signed   By: Valetta Mole M.D.   On: 01/21/2022 16:16   DG Chest 1 View  Result Date: 01/21/2022 CLINICAL DATA:  Shortness of breath with hand numbness, dizziness and shortness of breath for 1 day. EXAM: CHEST  1 VIEW COMPARISON:  Radiographs 01/07/2019 and 08/27/2015. PET-CT 05/14/2020. FINDINGS: 1326 hours. Two views submitted. The heart size and mediastinal contours are stable. The lungs are clear. There is no pleural effusion or pneumothorax. No acute osseous findings are identified. IMPRESSION: No active  cardiopulmonary process. Electronically Signed   By: Richardean Sale M.D.   On: 01/21/2022 13:41   CT HEAD WO CONTRAST  Result Date: 01/21/2022 CLINICAL DATA:  Neuro deficit. Bilateral lower extremity weakness for several days. EXAM: CT HEAD WITHOUT CONTRAST TECHNIQUE: Contiguous axial images were obtained from the base of the skull through the vertex without intravenous contrast. RADIATION DOSE REDUCTION: This exam was performed according to the departmental dose-optimization program which includes automated exposure control, adjustment of the mA and/or kV according to patient size and/or use of iterative reconstruction technique. COMPARISON:  08/23/2020. FINDINGS: Brain: No evidence of acute infarction, hemorrhage, hydrocephalus, extra-axial collection or mass lesion/mass effect. Sulcal widening with widened extra-axial spaces consistent with atrophy, stable from the prior head CT. Vascular: No hyperdense vessel or unexpected calcification. Skull: Normal. Negative for fracture or focal lesion. Sinuses/Orbits: Globes and orbits are unremarkable. Mild scattered ethmoid, sphenoid and right maxillary sinus mucosal thickening. Other: None. IMPRESSION: 1. No acute intracranial abnormalities. Electronically Signed   By: Lajean Manes M.D.   On: 01/21/2022 13:30    Procedures Procedures  {Document cardiac monitor, telemetry assessment procedure when appropriate:1}  Medications Ordered in ED Medications  LORazepam (ATIVAN) tablet 1 mg (has no administration in time range)    ED Course/ Medical Decision Making/ A&P                           Medical Decision Making  27 male with a history of peripheral vascular disease, carotid artery occlusion, history of throat cancer status postsurgery, CVA with left upper extremity numbness, hypertension   Patient was seen and evaluated in triage with differential diagnosis including CVA, electrolyte abnormality, infection, other toxic metabolic change,  anemia.  Labs completed and personally interpreted by me show negative alcohol level, normal INR, no clinically significant anemia, no leukocytosis or left shift, mild increase in creatinine from prior, normal troponin, negative influenza, COVID and RSV testing.  Images obtained include CT head which shows no evidence of acute intracranial hemorrhage.  MRI to evaluate for signs of stroke show background of parenchymal volume loss and small remote infarcts with no acute noted CVA.  Chest x-ray shows no evidence of pneumonia, pulmonary edema or pneumothorax.  EKG does show new bifascicular block in comparison to prior.  {Document critical care time  when appropriate:1} {Document review of labs and clinical decision tools ie heart score, Chads2Vasc2 etc:1}  {Document your independent review of radiology images, and any outside records:1} {Document your discussion with family members, caretakers, and with consultants:1} {Document social determinants of health affecting pt's care:1} {Document your decision making why or why not admission, treatments were needed:1} Final Clinical Impression(s) / ED Diagnoses Final diagnoses:  None    Rx / DC Orders ED Discharge Orders     None

## 2022-01-21 NOTE — ED Notes (Signed)
Pt evaluated by this RN upon arrival.  NIH performed.  Pt reports numbness to lips and bilateral hands but sensory perception is equal on both sides  Pt states symptoms of this began about 630 am  Has had URI since Monday, was out of work Tuesday, and this morning began having these symptoms with accompanied weakness.  Mild weakness to bilateral legs. No drift in any extremities. Face symmetrical.

## 2022-01-21 NOTE — ED Provider Triage Note (Signed)
Emergency Medicine Provider Triage Evaluation Note  Samuel Serrano , a 72 y.o. male  was evaluated in triage.  Pt complains of bilateral lower extremity weakness that has been present for the past few days however, worsened this morning.  Patient developed bilateral hand numbness/tingling early this morning (woke up with this, but told wife around [place]). Previous CVA.  He also admits to some confusion.  Wife at bedside.  Patient denies chest pain.  Admits to some shortness of breath.  Review of Systems  Positive: weakness Negative: fever  Physical Exam  BP 136/74 (BP Location: Left Arm)   Pulse 65   Temp (!) 97.5 F (36.4 C) (Oral)   Resp 16   Ht '6\' 1"'$  (1.854 m)   Wt 102.1 kg   SpO2 98%   BMI 29.69 kg/m  Gen:   Awake, no distress   Resp:  Normal effort  MSK:   Moves extremities without difficulty  Other:  Normal speech.  No facial droop.  No pronator drift.  Equal grip strength.  5/5 strength.  Medical Decision Making  Medically screening exam initiated at 12:58 PM.  Appropriate orders placed.  Samuel Serrano was informed that the remainder of the evaluation will be completed by another provider, this initial triage assessment does not replace that evaluation, and the importance of remaining in the ED until their evaluation is complete.  No focal deficits Stroke labs ordered, CT head Weakness has been present for numerous days. Out of TNK window   Samuel Serrano, [place] 01/21/22 1302

## 2022-01-21 NOTE — ED Triage Notes (Signed)
Pt c/o dizziness x several days; also endorses bilateral hand numbness/ tingling, numbness in lips, confusion, bilateral leg weakness, HA, sob; weakness in legs began around 0630 this am, numbness around 1000; hx prior stroke; pt not on thinners; pt a and o at present

## 2022-01-21 NOTE — ED Notes (Signed)
MD at bedside. 

## 2022-02-06 ENCOUNTER — Telehealth: Payer: Self-pay

## 2022-02-06 NOTE — Telephone Encounter (Signed)
Hey just wanted to send FYI. Pt was seen in the hospital on 01/21/22 with c/o dizziness, dyspnea, tingling in hands and lips, and being off balance. Looks like a ECHO was recommended for the patient after discharge. See full hospital note for full details. Please advise if he is ok to proceed as schedule.

## 2022-02-09 NOTE — Telephone Encounter (Signed)
Office visit with me or APP needed before colonoscopy

## 2022-02-10 NOTE — Telephone Encounter (Signed)
Patient has been scheduled for OV with Colleen on 2/26 at 11:30. PV was canceled.

## 2022-02-10 NOTE — Telephone Encounter (Signed)
Left message for pt to call back to schedule him an appt with an APP.

## 2022-02-10 NOTE — Telephone Encounter (Signed)
Noted  

## 2022-03-09 ENCOUNTER — Encounter: Payer: Self-pay | Admitting: Nurse Practitioner

## 2022-03-09 ENCOUNTER — Ambulatory Visit (INDEPENDENT_AMBULATORY_CARE_PROVIDER_SITE_OTHER): Payer: No Typology Code available for payment source | Admitting: Nurse Practitioner

## 2022-03-09 VITALS — BP 90/60 | HR 84 | Ht 72.0 in | Wt 224.2 lb

## 2022-03-09 DIAGNOSIS — Z86010 Personal history of colonic polyps: Secondary | ICD-10-CM

## 2022-03-09 MED ORDER — NA SULFATE-K SULFATE-MG SULF 17.5-3.13-1.6 GM/177ML PO SOLN: 1.0000 | Freq: Once | ORAL | 0 refills | Status: AC

## 2022-03-09 NOTE — Progress Notes (Signed)
Addendum: Reviewed and agree with assessment and management plan. Gaynelle Pastrana M, MD  

## 2022-03-09 NOTE — Patient Instructions (Addendum)
You have been scheduled for a colonoscopy. Please follow written instructions given to you at your visit today.  Please pick up your prep supplies at the pharmacy within the next 1-3 days. If you use inhalers (even only as needed), please bring them with you on the day of your procedure.  Miralax- every night as needed  Dulcolax- 1 to 2 tablets every 3rd night as needed  Eat lightly the day before colonoscopy prep date (03/14/22)   Due to recent changes in healthcare laws, you may see the results of your imaging and laboratory studies on MyChart before your provider has had a chance to review them.  We understand that in some cases there may be results that are confusing or concerning to you. Not all laboratory results come back in the same time frame and the provider may be waiting for multiple results in order to interpret others.  Please give [place] 48 hours in order for your provider to thoroughly review all the results before contacting the office for clarification of your results.    Thank you for trusting me with your gastrointestinal care!   Carl Best, CRNP

## 2022-03-09 NOTE — Progress Notes (Signed)
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 He presents to our office today to discuss scheduling a colonoscopy. He is known by Dr. Hilarie Fredrickson.  He has chronic constipation. He passes a normal formed brown bowel movement every 3 days, sometimes goes 4 days without passing a BM. No rectal bleeding or black stools.  His most recent colonoscopy was 07/23/2017 which identified one 15 mm sessile serrated polyp removed from the ascending colon and three 2 to 4 mm tubular adenomatous polyps were removed from the transverse colon and diverticulosis was noted in the sigmoid, descending and ascending colon. He was advised by Dr. Hilarie Fredrickson repeat a colonoscopy in 3 years. He was scheduled for a colonoscopy with a GI at the [facility] December 2023, however, the procedure was aborted because bowel prep was poor. A repeat colonoscopy was recommended with a 2-day bowel prep but he did not wish to proceed with a 2-day prep therefore he elected to schedule a colonoscopy with Dr. Hilarie Fredrickson on 03/16/2022. A pre-colonoscopy office visit was recommended because he was recently seen in he ED. He presented to the ED 01/21/2022 with tinnitus, dyspnea, tingling in hands/lips and he felt off balance. CT head without evidence of acute intracranial hemorrhage. Brain MRI showed background of parenchymal volume loss and small remote infarcts with no acute noted CVA. 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 No evidence of acute ischemia.  Chest x-ray was negative.  Chest CT was negative for PE with questionable evidence of pulmonary hypertension.  He was  discharged home with recommendations to repeat an ECHO as an outpatient. He saw his PCP who determined that he had a viral illness and no further neurological workup was recommended.  He underwent an ECHO 01/29/2022 at the [facility] which showed LVEF > 55% without evidence of pulmonary hypertension.  He reports feeling quite well at this time.  No headaches, dizziness or balance issues.  No chest pain, palpitations or shortness of breath.  He walks 3 miles daily. He takes ASA 325 mg once daily.  He takes meloxicam 15 mg daily and naproxen to 20 mg once daily for arthritis pain.  He denies having any GERD symptoms or abdominal pain.       Latest Ref Rng & Units 01/21/2022    1:26 PM 01/21/2022    1:11 PM 08/22/2020   12:59 AM  CBC  WBC 4.0 - 10.5 K/uL  5.8    Hemoglobin 13.0 - 17.0 g/dL 13.3  12.9  12.6   Hematocrit 39.0 - 52.0 % 39.0  39.6  37.0   Platelets 150 - 400 K/uL  194         Latest Ref Rng & Units 01/21/2022    1:26 PM 01/21/2022    1:11 PM 08/22/2020   12:59 AM  CMP  Glucose 70 - 99 mg/dL 106  110  111   BUN 8 - 23 mg/dL '19  17  22   '$ Creatinine 0.61 - 1.24 mg/dL 1.30  1.36  1.10   Sodium 135 - 145 mmol/L 139  137  142   Potassium 3.5 - 5.1 mmol/L 4.3  4.4  4.6   Chloride 98 - 111 mmol/L 101  102  109   CO2 22 - 32 mmol/L  28    Calcium 8.9 - 10.3 mg/dL  9.3    Total Protein 6.5 - 8.1 g/dL  6.9    Total Bilirubin 0.3 - 1.2 mg/dL  1.1    Alkaline Phos 38 - 126 U/L  41    AST 15 - 41 U/L  23    ALT 0 - 44 U/L  22      ECHO 01/29/2022: There are no prior TTE studies for comparison. Left ventricular systolic  function is normal. Ejection Fraction = >55%. No regional wall motion abnormalities identified The left ventricle is mildly dilated. The right ventricle is mild to moderately dilated. The right ventricular systolic function is normal. The right atrium is mildly dilated. The inferior vena cava is normal in size, and collapses normally with  respiration. TR jet appears to have  low velocity and there is no evidence of pulmonary  hypertension. There are no prior TTE studies for comparison.   GI PROCEDURES:  Colonoscopy 07/23/2017:  1. Surgical [P], ascending, polyp - SESSILE SERRATED POLYP WITHOUT DYSPLASIA (FIFTEEN). - NO EVIDENCE OF MALIGNANCY. 2. Surgical [P], ascending, transverse, polyp (3) - TUBULAR ADENOMA (THREE). 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)    Past Surgical History:  Procedure Laterality Date   COLONOSCOPY     CYST EXCISION     benign cysts in breasts at age 69   ENDARTERECTOMY Left 07/24/2016   Procedure: Left Carotid ENDARTERECTOMY;  Surgeon: Rosetta Posner, MD;  Location: [facility] OR;  Service: Vascular;  Laterality: Left;   PATCH ANGIOPLASTY Left 07/24/2016   Procedure: PATCH ANGIOPLASTY;  Surgeon: Rosetta Posner, MD;  Location: [facility] OR;  Service: Vascular;  Laterality: Left;   throat cancer surgery     WISDOM TOOTH EXTRACTION     Social History: He is married.  He has 1 son and 2 daughters.  Continues to smoke cigarettes daily.  He drinks 3 alcoholic beverages weekly.  Family History: Mother with history of breast cancer. Father with hypertension and diabetes.  No known family history of esophageal, gastric or colorectal cancer.    Allergies  Allergen Reactions   No Known Allergies       Outpatient Encounter Medications as of 03/09/2022  Medication Sig   aspirin 325 MG EC tablet Take 1 tablet (325 mg total) by mouth daily.   atorvastatin (LIPITOR) 40 MG tablet Take 1 tablet (40 mg total) by mouth daily.   buPROPion (WELLBUTRIN SR) 150 MG 12 hr tablet Start one week before quit date. Take 1 tab daily x 3 days, then 1 tab BID thereafter. (Patient taking differently: Take 150 mg by mouth 2 (two) times daily. Start one week before quit date.)    hydrOXYzine (ATARAX/VISTARIL) 25 MG tablet Take 100 mg by mouth at bedtime.   lisinopril (ZESTRIL) 20 MG tablet Take 20 mg by mouth daily.   meclizine (ANTIVERT) 25 MG tablet Take 1 tablet (25 mg total) by mouth 3 (three) times daily as needed for dizziness.   naproxen sodium (ALEVE) 220 MG tablet Take 220 mg by mouth daily as needed (pain).   No facility-administered encounter medications on file  as of 03/09/2022.     REVIEW OF SYSTEMS:  Gen: Denies fever, sweats or chills. No weight loss.  CV: Denies chest pain, palpitations or edema. Resp: Denies cough, shortness of breath of hemoptysis.  GI: See HPI.  GU : Denies urinary burning, blood in urine, increased urinary frequency or incontinence. MS: + Arthritis. Derm: Denies rash, itchiness, skin lesions or unhealing ulcers. Psych: Denies depression, anxiety, memory loss or confusion. Heme: Denies bruising, easy bleeding. Neuro:  Denies headaches, dizziness or paresthesias. Endo:  Denies any problems with DM, thyroid or adrenal function.  PHYSICAL EXAM: BP 90/60 (BP Location: Left Arm, Patient Position: Sitting, Cuff Size: Normal)   Pulse 84   Ht 6' (1.829 m) Comment: height measured without shoes  Wt 224 lb 4 oz (101.7 kg)   BMI 30.41 kg/m   General: 72 year old male in no acute distress. Head: Normocephalic and atraumatic. Eyes:  Sclerae non-icteric, conjunctive pink. Ears: Normal auditory acuity. Mouth: Dentition intact. No ulcers or lesions.  Neck: Supple, no lymphadenopathy or thyromegaly.  Lungs: Clear bilaterally to auscultation without wheezes, crackles or rhonchi. Heart: Regular rate and rhythm. No murmur, rub or gallop appreciated.  Abdomen: Soft, nontender, nondistended. No masses. No hepatosplenomegaly. Normoactive bowel sounds x 4 quadrants.  Rectal:  Musculoskeletal: Symmetrical with no gross deformities. Skin: Warm and dry. No rash or lesions on visible extremities. Extremities: No edema. Neurological: Alert  oriented x 4, no focal deficits.  Psychological:  Alert and cooperative. Normal mood and affect.  ASSESSMENT AND PLAN:  72 year old male with a history of colon polyps. Colonoscopy 07/23/2017 identified one 15 mm sessile serrated polyp removed from the ascending colon and three 2 to 4 mm tubular adenomatous polyps were removed from the transverse colon.  Follow-up colonoscopy at the [facility] 12/2021 was incomplete due to a poor bowel prep.  Repeat colonoscopy with 2-day bowel prep was recommended but the patient did not wish to complete a 2-day bowel prep.   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

## 2022-03-15 ENCOUNTER — Encounter: Payer: Self-pay | Admitting: Certified Registered Nurse Anesthetist

## 2022-03-16 ENCOUNTER — Encounter: Payer: Self-pay | Admitting: Internal Medicine

## 2022-03-16 ENCOUNTER — Ambulatory Visit (AMBULATORY_SURGERY_CENTER): Payer: No Typology Code available for payment source | Admitting: Internal Medicine

## 2022-03-16 VITALS — BP 139/78 | HR 60 | Temp 98.4°F | Resp 18 | Ht 72.0 in | Wt 224.0 lb

## 2022-03-16 DIAGNOSIS — D123 Benign neoplasm of transverse colon: Secondary | ICD-10-CM

## 2022-03-16 DIAGNOSIS — Z86010 Personal history of colonic polyps: Secondary | ICD-10-CM | POA: Diagnosis not present

## 2022-03-16 DIAGNOSIS — D128 Benign neoplasm of rectum: Secondary | ICD-10-CM | POA: Diagnosis not present

## 2022-03-16 DIAGNOSIS — D124 Benign neoplasm of descending colon: Secondary | ICD-10-CM | POA: Diagnosis not present

## 2022-03-16 DIAGNOSIS — K621 Rectal polyp: Secondary | ICD-10-CM | POA: Diagnosis not present

## 2022-03-16 DIAGNOSIS — K635 Polyp of colon: Secondary | ICD-10-CM | POA: Diagnosis not present

## 2022-03-16 MED ORDER — SODIUM CHLORIDE 0.9 % IV SOLN: 500.0000 mL | Freq: Once | INTRAVENOUS | Status: AC

## 2022-03-16 NOTE — Progress Notes (Signed)
VS by DT  Pt's states no medical or surgical changes since previsit or office visit.  

## 2022-03-16 NOTE — Op Note (Signed)
[facility] Patient Name: Samuel Serrano Procedure Date: 03/16/2022 2:00 PM MRN: [id number] Endoscopist: Jerene Bears , MD, [id number] Age: 72 Referring MD:  Date of Birth: 30-Dec-1950 Gender: Male Account #: [id number] Procedure:                Colonoscopy Indications:              High risk colon cancer surveillance: Personal                            history of sessile serrated colon polyp (10 mm or                            greater in size) removed piecemeal in 2019 along                            with 3 subcentimeter adenomas, Last colonoscopy:                            July 2019 Medicines:                Monitored Anesthesia Care Procedure:                Pre-Anesthesia Assessment:                           - Prior to the procedure, a History and Physical                            was performed, and patient medications and                            allergies were reviewed. The patient's tolerance of                            previous anesthesia was also reviewed. The risks                            and benefits of the procedure and the sedation                            options and risks were discussed with the patient.                            All questions were answered, and informed consent                            was obtained. Prior Anticoagulants: The patient has                            taken no anticoagulant or antiplatelet agents. ASA                            Grade Assessment: III - A patient with severe  systemic disease. After reviewing the risks and                            benefits, the patient was deemed in satisfactory                            condition to undergo the procedure.                           After obtaining informed consent, the colonoscope                            was passed under direct vision. Throughout the                            procedure, the patient's blood pressure, pulse, and                             oxygen saturations were monitored continuously. The                            Olympus SN [id number] was introduced through the anus                            and advanced to the cecum, identified by                            appendiceal orifice and ileocecal valve. The                            colonoscopy was performed without difficulty. The                            patient tolerated the procedure well. The quality                            of the bowel preparation was good (irrigation and                            lavage required in right colon). The ileocecal                            valve, appendiceal orifice, and rectum were                            photographed. Scope In: 2:05:39 PM Scope Out: 2:27:40 PM Scope Withdrawal Time: 0 hours 18 minutes 27 seconds  Total Procedure Duration: 0 hours 22 minutes 1 second  Findings:                 The digital rectal exam was normal.                           Two sessile polyps were found in the transverse  colon. The polyps were 4 to 5 mm in size. These                            polyps were removed with a cold snare. Resection                            and retrieval were complete.                           A 5 mm polyp was found in the descending colon. The                            polyp was sessile. The polyp was removed with a                            cold snare. Resection and retrieval were complete.                           Three sessile polyps were found in the rectum. The                            polyps were 4 to 5 mm in size. These polyps were                            removed with a cold snare. Resection and retrieval                            were complete.                           Multiple large-mouthed and small-mouthed                            diverticula were found in the sigmoid colon and                            descending colon.                           No  additional abnormalities were found on                            retroflexion. Complications:            No immediate complications. Estimated Blood Loss:     Estimated blood loss was minimal. Impression:               - Two 4 to 5 mm polyps in the transverse colon,                            removed with a cold snare. Resected and retrieved.                           - One 5 mm polyp in the descending colon, removed  with a cold snare. Resected and retrieved.                           - Three 4 to 5 mm polyps in the rectum, removed                            with a cold snare. Resected and retrieved.                           - Moderate diverticulosis in the sigmoid colon and                            in the descending colon. Recommendation:           - Patient has a contact number available for                            emergencies. The signs and symptoms of potential                            delayed complications were discussed with the                            patient. Return to normal activities tomorrow.                            Written discharge instructions were provided to the                            patient.                           - Resume previous diet.                           - Continue present medications.                           - Await pathology results.                           - Repeat colonoscopy is recommended for                            surveillance. The colonoscopy date will be                            determined after pathology results from today's                            exam become available for review. Jerene Bears, MD 03/16/2022 2:36:31 PM This report has been signed electronically.

## 2022-03-16 NOTE — Patient Instructions (Signed)
Impression/Recommendations:  Polyp and diverticulosis handouts given to patient.  Resume previous diet. Continue present medications. Await pathology results.  Repeat colonoscopy recommended for surveillance.  Date to be determined after pathology results reviewed.  YOU HAD AN ENDOSCOPIC PROCEDURE TODAY AT [facility]:   Refer to the procedure report that was given to you for any specific questions about what was found during the examination.  If the procedure report does not answer your questions, please call your gastroenterologist to clarify.  If you requested that your care partner not be given the details of your procedure findings, then the procedure report has been included in a sealed envelope for you to review at your convenience later.  YOU SHOULD EXPECT: Some feelings of bloating in the abdomen. Passage of more gas than usual.  Walking can help get rid of the air that was put into your GI tract during the procedure and reduce the bloating. If you had a lower endoscopy (such as a colonoscopy or flexible sigmoidoscopy) you may notice spotting of blood in your stool or on the toilet paper. If you underwent a bowel prep for your procedure, you may not have a normal bowel movement for a few days.  Please Note:  You might notice some irritation and congestion in your nose or some drainage.  This is from the oxygen used during your procedure.  There is no need for concern and it should clear up in a day or so.  SYMPTOMS TO REPORT IMMEDIATELY:  Following lower endoscopy (colonoscopy or flexible sigmoidoscopy):  Excessive amounts of blood in the stool  Significant tenderness or worsening of abdominal pains  Swelling of the abdomen that is new, acute  Fever of 100F or higher  For urgent or emergent issues, a gastroenterologist can be reached at any hour by calling [phone]. Do not use MyChart messaging for urgent concerns.    DIET:  We do recommend a small meal at  first, but then you may proceed to your regular diet.  Drink plenty of fluids but you should avoid alcoholic beverages for 24 hours.  ACTIVITY:  You should plan to take it easy for the rest of today and you should NOT DRIVE or use heavy machinery until tomorrow (because of the sedation medicines used during the test).    FOLLOW UP: Our staff will call the number listed on your records the next business day following your procedure.  We will call around 7:15- 8:00 am to check on you and address any questions or concerns that you may have regarding the information given to you following your procedure. If we do not reach you, we will leave a message.     If any biopsies were taken you will be contacted by phone or by letter within the next 1-3 weeks.  Please call us at [phone] if you have not heard about the biopsies in 3 weeks.    SIGNATURES/CONFIDENTIALITY: You and/or your care partner have signed paperwork which will be entered into your electronic medical record.  These signatures attest to the fact that that the information above on your After Visit Summary has been reviewed and is understood.  Full responsibility of the confidentiality of this discharge information lies with you and/or your care-partner.

## 2022-03-16 NOTE — Progress Notes (Signed)
Report given to PACU, vss 

## 2022-03-16 NOTE — Progress Notes (Signed)
See office note dated 03/09/2022 for details and current H&P.  Patient with history of multiple adenomatous and sessile serrated colon polyp. Last colonoscopy 07/23/2017  He remains appropriate for [place] colonoscopy today.

## 2022-03-16 NOTE — Progress Notes (Signed)
Called to room to assist during endoscopic procedure.  Patient ID and intended procedure confirmed with present staff. Received instructions for my participation in the procedure from the performing physician.  

## 2022-03-17 ENCOUNTER — Telehealth: Payer: Self-pay | Admitting: *Deleted

## 2022-03-17 NOTE — Telephone Encounter (Signed)
  Follow up Call-     03/16/2022    1:27 PM  Call back number  Post procedure Call Back phone  # [phone]  Permission to leave phone message Yes     Patient questions:  Do you have a fever, pain , or abdominal swelling? No. Pain Score  0 *  Have you tolerated food without any problems? Yes.    Have you been able to return to your normal activities? Yes.    Do you have any questions about your discharge instructions: Diet   No. Medications  No. Follow up visit  No.  Do you have questions or concerns about your Care? No.  Actions: * If pain score is 4 or above: No action needed, pain <4.

## 2022-03-19 ENCOUNTER — Encounter: Payer: Self-pay | Admitting: Internal Medicine

## 2022-04-14 ENCOUNTER — Ambulatory Visit: Payer: Medicare Other | Admitting: Internal Medicine

## 2022-04-27 ENCOUNTER — Telehealth: Payer: Self-pay | Admitting: Internal Medicine

## 2022-04-27 NOTE — Telephone Encounter (Signed)
Records faxed as requested.

## 2022-04-27 NOTE — Telephone Encounter (Signed)
Inbound call from the [place], requesting notes from procedure on 3/4 and office visit with [facility] 2/26. Stated fax number was [phone].

## 2022-05-11 NOTE — Telephone Encounter (Signed)
Pt requesting that path result from colon be faxed to the [place]. Result faxed per pt request.

## 2022-05-11 NOTE — Telephone Encounter (Signed)
Patient called regarding referral, states that he would like for [place] to fax over his records again because he has a updated referral that we do not have. Please advise, thank you.

## 2022-07-29 ENCOUNTER — Ambulatory Visit (INDEPENDENT_AMBULATORY_CARE_PROVIDER_SITE_OTHER): Payer: No Typology Code available for payment source | Admitting: Internal Medicine

## 2022-07-29 ENCOUNTER — Encounter: Payer: Self-pay | Admitting: Internal Medicine

## 2022-07-29 VITALS — BP 124/72 | HR 78 | Ht 73.0 in | Wt 218.1 lb

## 2022-07-29 DIAGNOSIS — D649 Anemia, unspecified: Secondary | ICD-10-CM | POA: Diagnosis not present

## 2022-07-29 DIAGNOSIS — Z86010 Personal history of colonic polyps: Secondary | ICD-10-CM

## 2022-07-29 DIAGNOSIS — R634 Abnormal weight loss: Secondary | ICD-10-CM

## 2022-07-29 NOTE — Patient Instructions (Signed)
You have been scheduled for an endoscopy. Please follow written instructions given to you at your visit today. If you use inhalers (even only as needed), please bring them with you on the day of your procedure.  If you take any of the following medications, they will need to be adjusted prior to your procedure:   DO NOT TAKE 7 DAYS PRIOR TO TEST- Trulicity (dulaglutide) Ozempic, Wegovy (semaglutide) Mounjaro (tirzepatide) Bydureon Bcise (exanatide extended release)  DO NOT TAKE 1 DAY PRIOR TO YOUR TEST Rybelsus (semaglutide) Adlyxin (lixisenatide) Victoza (liraglutide) Byetta (exanatide) ___________________________________________________________________________  Thank you for entrusting me with your care and for choosing [facility], Dr. Erick Blinks   If your blood pressure at your visit was 140/90 or greater, please contact your primary care physician to follow up on this. ______________________________________________________  If you are age 77 or older, your body mass index should be between 23-30. Your Body mass index is 28.78 kg/m. If this is out of the aforementioned range listed, please consider follow up with your Primary Care Provider.  If you are age 57 or younger, your body mass index should be between 19-25. Your Body mass index is 28.78 kg/m. If this is out of the aformentioned range listed, please consider follow up with your Primary Care Provider.  ________________________________________________________  The [place] [facility] providers would like to encourage you to use [facility] to communicate with providers for non-urgent requests or questions.  Due to long hold times on the telephone, sending your provider a message by [facility] may be a faster and more efficient way to get a response.  Please allow 48 business hours for a response.  Please remember that this is for non-urgent requests.  _______________________________________________________  Due to recent changes  in healthcare laws, you may see the results of your imaging and laboratory studies on MyChart before your provider has had a chance to review them.  We understand that in some cases there may be results that are confusing or concerning to you. Not all laboratory results come back in the same time frame and the provider may be waiting for multiple results in order to interpret others.  Please give [place] 48 hours in order for your provider to thoroughly review all the results before contacting the office for clarification of your results.

## 2022-07-29 NOTE — Progress Notes (Signed)
   Subjective:    Patient ID: Samuel Serrano, male    DOB: November 27, 1950, 72 y.o.   MRN: [id number]  HPI Samuel Serrano is a 72 year old male known to me for his history of adenomatous colon polyps, also with a history of CVA in 2022, tonsillar cancer status post resection, sleep apnea, CAD with endarterectomy in 2018 who was sent back from the [place] to evaluate normocytic anemia without significant iron deficiency and weight loss for consideration of EGD.  He is here alone today.  He reports that he does not have specific GI complaint.  He has cut bread out of his diet and lost about 30 pounds in the last 6 to 12 months.  He is not sure that his diet change fully explains his weight loss.  He is not having abdominal pain.  No dysphagia, odynophagia, early satiety, nausea or vomiting.  No troublesome heartburn.  Bowel habits have been regular.  No blood in stool or melena.   Review of Systems As per HPI, otherwise negative  Current Medications, Allergies, Past Medical History, Past Surgical History, Family History and Social History were reviewed in [employer] record.    Objective:   Physical Exam BP 124/72   Pulse 78   Ht 6\' 1"  (1.854 m)   Wt 218 lb 2 oz (98.9 kg)   BMI 28.78 kg/m  Gen: awake, alert, NAD HEENT: anicteric  CV: RRR, no mrg Pulm: CTA b/l Abd: soft, NT/ND, +BS throughout Ext: no c/c/e Neuro: nonfocal  Blood work reviewed from the [place] dated 07/08/2022 Ferritin 65, iron 50, TIBC 287, iron saturation 17.4% transferrin 218 B12 418 Hemoglobin 11.5, MCV 93.0, platelet count 164, white count 6.9      Assessment & Plan:  72 year old male known to me for his history of adenomatous colon polyps, also with a history of CVA in 2022, tonsillar cancer status post resection, sleep apnea, CAD with endarterectomy in 2018 who was sent back from the [place] to evaluate normocytic anemia without significant iron deficiency and weight loss for consideration of EGD.   Normocytic  anemia and weight loss --EGD recommended to exclude upper GI tract pathology.  Iron studies are not that of iron deficiency and therefore if EGD is normal I do not think he would will need a video capsule endoscopy at this time. -- EGD in the [facility]; we discussed the risk, benefits and alternatives and he is agreeable and wishes to proceed  2.  History of colon polyps --colonoscopy was performed earlier this year and he will be due surveillance in 5 years time  30 minutes total spent today including patient facing time, coordination of care, reviewing medical history/procedures/pertinent radiology studies, and documentation of the encounter.

## 2022-08-04 ENCOUNTER — Encounter: Payer: Self-pay | Admitting: Internal Medicine

## 2022-08-04 ENCOUNTER — Ambulatory Visit: Payer: No Typology Code available for payment source | Admitting: Internal Medicine

## 2022-08-04 VITALS — BP 129/75 | HR 66 | Temp 98.2°F | Resp 12 | Ht 73.0 in | Wt 218.0 lb

## 2022-08-04 DIAGNOSIS — D649 Anemia, unspecified: Secondary | ICD-10-CM | POA: Diagnosis not present

## 2022-08-04 DIAGNOSIS — K2970 Gastritis, unspecified, without bleeding: Secondary | ICD-10-CM

## 2022-08-04 DIAGNOSIS — K2950 Unspecified chronic gastritis without bleeding: Secondary | ICD-10-CM | POA: Diagnosis not present

## 2022-08-04 DIAGNOSIS — R634 Abnormal weight loss: Secondary | ICD-10-CM

## 2022-08-04 MED ORDER — SODIUM CHLORIDE 0.9 % IV SOLN: 500.0000 mL | Freq: Once | INTRAVENOUS | Status: DC

## 2022-08-04 NOTE — Progress Notes (Signed)
Sedate, gd SR, tolerated procedure well, VSS, report to RN 

## 2022-08-04 NOTE — Patient Instructions (Signed)
    Handout on Gastritis given to you today  Await gastric biopsy results   Resume previous diet & medications      YOU HAD AN ENDOSCOPIC PROCEDURE TODAY AT THE [facility]:   Refer to the procedure report that was given to you for any specific questions about what was found during the examination.  If the procedure report does not answer your questions, please call your gastroenterologist to clarify.  If you requested that your care partner not be given the details of your procedure findings, then the procedure report has been included in a sealed envelope for you to review at your convenience later.  YOU SHOULD EXPECT: Some feelings of bloating in the abdomen. Passage of more gas than usual.  Walking can help get rid of the air that was put into your GI tract during the procedure and reduce the bloating. If you had a lower endoscopy (such as a colonoscopy or flexible sigmoidoscopy) you may notice spotting of blood in your stool or on the toilet paper. If you underwent a bowel prep for your procedure, you may not have a normal bowel movement for a few days.  Please Note:  You might notice some irritation and congestion in your nose or some drainage.  This is from the oxygen used during your procedure.  There is no need for concern and it should clear up in a day or so.  SYMPTOMS TO REPORT IMMEDIATELY:   Following upper endoscopy (EGD)  Vomiting of blood or coffee ground material  New chest pain or pain under the shoulder blades  Painful or persistently difficult swallowing  New shortness of breath  Fever of 100F or higher  Black, tarry-looking stools  For urgent or emergent issues, a gastroenterologist can be reached at any hour by calling [phone]. Do not use MyChart messaging for urgent concerns.    DIET:  We do recommend a small meal at first, but then you may proceed to your regular diet.  Drink plenty of fluids but you should avoid alcoholic beverages for 24  hours.  ACTIVITY:  You should plan to take it easy for the rest of today and you should NOT DRIVE or use heavy machinery until tomorrow (because of the sedation medicines used during the test).    FOLLOW UP: Our staff will call the number listed on your records the next business day following your procedure.  We will call around 7:15- 8:00 am to check on you and address any questions or concerns that you may have regarding the information given to you following your procedure. If we do not reach you, we will leave a message.     If any biopsies were taken you will be contacted by phone or by letter within the next 1-3 weeks.  Please call us at [phone] if you have not heard about the biopsies in 3 weeks.    SIGNATURES/CONFIDENTIALITY: You and/or your care partner have signed paperwork which will be entered into your electronic medical record.  These signatures attest to the fact that that the information above on your After Visit Summary has been reviewed and is understood.  Full responsibility of the confidentiality of this discharge information lies with you and/or your care-partner.

## 2022-08-04 NOTE — Op Note (Signed)
[facility] Patient Name: Samuel Serrano Procedure Date: 08/04/2022 9:28 AM MRN: [id number] Endoscopist: Beverley Fiedler , MD, [id number] Age: 72 Referring MD:  Date of Birth: 1950/07/26 Gender: Male Account #: [id number] Procedure:                Upper GI endoscopy Indications:              Weight loss and normocytic anemia, recent                            colonoscopy Medicines:                Monitored Anesthesia Care Procedure:                Pre-Anesthesia Assessment:                           - Prior to the procedure, a History and Physical                            was performed, and patient medications and                            allergies were reviewed. The patient's tolerance of                            previous anesthesia was also reviewed. The risks                            and benefits of the procedure and the sedation                            options and risks were discussed with the patient.                            All questions were answered, and informed consent                            was obtained. Prior Anticoagulants: The patient has                            taken no anticoagulant or antiplatelet agents. ASA                            Grade Assessment: II - A patient with mild systemic                            disease. After reviewing the risks and benefits,                            the patient was deemed in satisfactory condition to                            undergo the procedure.  After obtaining informed consent, the endoscope was                            passed under direct vision. Throughout the                            procedure, the patient's blood pressure, pulse, and                            oxygen saturations were monitored continuously. The                            Olympus Scope [id number] was introduced through the                            mouth, and advanced to the second part of duodenum.                             The upper GI endoscopy was accomplished without                            difficulty. The patient tolerated the procedure                            well. Scope In: Scope Out: Findings:                 The examined esophagus was normal.                           Mild inflammation characterized by congestion                            (edema) and scattered erosions was found in the                            gastric antrum. Biopsies were taken with a cold                            forceps for histology and Helicobacter pylori                            testing.                           The cardia and gastric fundus were normal on                            retroflexion.                           The examined duodenum was normal. Complications:            No immediate complications. Estimated Blood Loss:     Estimated blood loss was minimal. Impression:               - Normal esophagus.                           -  Mild gastritis. Biopsied.                           - Normal examined duodenum. Recommendation:           - Patient has a contact number available for                            emergencies. The signs and symptoms of potential                            delayed complications were discussed with the                            patient. Return to normal activities tomorrow.                            Written discharge instructions were provided to the                            patient.                           - Resume previous diet.                           - Continue present medications.                           - Await pathology results.                           - If continued weight loss then CT abd/pelvis with                            contrast should be considered. In setting of                            patient's history of tonsillar cancer will defer                            surveillance imaging to oncology. Beverley Fiedler, MD 08/04/2022  9:50:24 AM This report has been signed electronically.

## 2022-08-04 NOTE — Progress Notes (Signed)
See office note dated 07/29/2022 for details and current H&P  Patient presenting for upper endoscopy to evaluate weight loss normocytic anemia, colonoscopy performed earlier this year, see that report for details  He remains appropriate for [facility] upper endoscopy today.

## 2022-08-04 NOTE — Progress Notes (Signed)
Called to room to assist during endoscopic procedure.  Patient ID and intended procedure confirmed with present staff. Received instructions for my participation in the procedure from the performing physician.  

## 2022-08-05 ENCOUNTER — Telehealth: Payer: Self-pay

## 2022-08-05 NOTE — Telephone Encounter (Signed)
  Follow up Call-     08/04/2022    8:50 AM 03/16/2022    1:27 PM  Call back number  Post procedure Call Back phone  # [phone]  Permission to leave phone message Yes Yes    Post op call attempted, no answer, left WM.

## 2022-08-06 ENCOUNTER — Encounter: Payer: Self-pay | Admitting: Internal Medicine

## 2022-09-04 ENCOUNTER — Emergency Department (HOSPITAL_COMMUNITY): Payer: No Typology Code available for payment source

## 2022-09-04 ENCOUNTER — Encounter (HOSPITAL_COMMUNITY): Payer: Self-pay

## 2022-09-04 ENCOUNTER — Other Ambulatory Visit: Payer: Self-pay

## 2022-09-04 ENCOUNTER — Emergency Department (HOSPITAL_COMMUNITY)
Admission: EM | Admit: 2022-09-04 | Discharge: 2022-09-04 | Disposition: A | Discharge status: Home / Self Care | Payer: No Typology Code available for payment source | Attending: Emergency Medicine | Admitting: Emergency Medicine

## 2022-09-04 DIAGNOSIS — R55 Syncope and collapse: Secondary | ICD-10-CM | POA: Insufficient documentation

## 2022-09-04 DIAGNOSIS — H538 Other visual disturbances: Secondary | ICD-10-CM | POA: Insufficient documentation

## 2022-09-04 DIAGNOSIS — R42 Dizziness and giddiness: Secondary | ICD-10-CM | POA: Diagnosis present

## 2022-09-04 DIAGNOSIS — R200 Anesthesia of skin: Secondary | ICD-10-CM | POA: Insufficient documentation

## 2022-09-04 DIAGNOSIS — Z8521 Personal history of malignant neoplasm of larynx: Secondary | ICD-10-CM | POA: Insufficient documentation

## 2022-09-04 DIAGNOSIS — I10 Essential (primary) hypertension: Secondary | ICD-10-CM | POA: Insufficient documentation

## 2022-09-04 LAB — COMPREHENSIVE METABOLIC PANEL
ALT: 13 U/L (ref 0–44)
AST: 16 U/L (ref 15–41)
Albumin: 3.5 g/dL (ref 3.5–5.0)
Alkaline Phosphatase: 41 U/L (ref 38–126)
Anion gap: 13 (ref 5–15)
BUN: 20 mg/dL (ref 8–23)
CO2: 25 mmol/L (ref 22–32)
Calcium: 8.6 mg/dL — ABNORMAL LOW (ref 8.9–10.3)
Chloride: 101 mmol/L (ref 98–111)
Creatinine, Ser: 1.97 mg/dL — ABNORMAL HIGH (ref 0.61–1.24)
GFR, Estimated: 35 mL/min — ABNORMAL LOW (ref >60)
Glucose, Bld: 125 mg/dL — ABNORMAL HIGH (ref 70–99)
Potassium: 3.7 mmol/L (ref 3.5–5.1)
Sodium: 139 mmol/L (ref 135–145)
Total Bilirubin: 0.6 mg/dL (ref 0.3–1.2)
Total Protein: 5.9 g/dL — ABNORMAL LOW (ref 6.5–8.1)

## 2022-09-04 LAB — TROPONIN I (HIGH SENSITIVITY)
Troponin I (High Sensitivity): 11 ng/L (ref <18)
Troponin I (High Sensitivity): 27 ng/L — ABNORMAL HIGH (ref <18)
Troponin I (High Sensitivity): 33 ng/L — ABNORMAL HIGH (ref <18)
Troponin I (High Sensitivity): 29 ng/L — ABNORMAL HIGH (ref <18)

## 2022-09-04 LAB — CBC WITH DIFFERENTIAL/PLATELET
Abs Immature Granulocytes: 0.03 10*3/uL (ref 0.00–0.07)
Basophils Absolute: 0 10*3/uL (ref 0.0–0.1)
Basophils Relative: 1 %
Eosinophils Absolute: 0.1 10*3/uL (ref 0.0–0.5)
Eosinophils Relative: 1 %
HCT: 35.3 % — ABNORMAL LOW (ref 39.0–52.0)
Hemoglobin: 12.1 g/dL — ABNORMAL LOW (ref 13.0–17.0)
Immature Granulocytes: 0 %
Lymphocytes Relative: 16 %
Lymphs Abs: 1.3 10*3/uL (ref 0.7–4.0)
MCH: 32.2 pg (ref 26.0–34.0)
MCHC: 34.3 g/dL (ref 30.0–36.0)
MCV: 93.9 fL (ref 80.0–100.0)
Monocytes Absolute: 0.6 10*3/uL (ref 0.1–1.0)
Monocytes Relative: 8 %
Neutro Abs: 5.8 10*3/uL (ref 1.7–7.7)
Neutrophils Relative %: 74 %
Platelets: 149 10*3/uL — ABNORMAL LOW (ref 150–400)
RBC: 3.76 MIL/uL — ABNORMAL LOW (ref 4.22–5.81)
RDW: 12 % (ref 11.5–15.5)
WBC: 7.9 10*3/uL (ref 4.0–10.5)
nRBC: 0 % (ref 0.0–0.2)

## 2022-09-04 LAB — MAGNESIUM: Magnesium: 2 mg/dL (ref 1.7–2.4)

## 2022-09-04 LAB — TSH: TSH: 0.557 u[IU]/mL (ref 0.350–4.500)

## 2022-09-04 MED ORDER — LACTATED RINGERS IV BOLUS
1000.0000 mL | Freq: Once | INTRAVENOUS | Status: AC
  Administered 2022-09-04: 1000 mL via INTRAVENOUS

## 2022-09-04 NOTE — ED Notes (Signed)
Patient transported to MRI 

## 2022-09-04 NOTE — ED Provider Notes (Signed)
  Physical Exam  BP 114/71   Pulse 90   Temp 97.8 F (36.6 C) (Oral)   Resp 19   Ht 6\' 1"  (1.854 m)   Wt 99 kg   SpO2 97%   BMI 28.80 kg/m   Physical Exam  Procedures  Procedures  ED Course / MDM   Clinical Course as of 09/04/22 1504  Fri Sep 04, 2022  1500 Stable. Hx of CVA w/ residual left sided deficits. Here with vision changes and near syncope. Bright lights noted in all visual fields this morning, then lightheadedness. EMS reports initial hypotension improving with fluids. Labs show AKI. Getting fluids here. Getting MRI to rule out CVA. Numerous PVCs on EKG - dispo per MRI - consider zio patch if d/c'd  [CD]    Clinical Course User Index [CD] Rhys Martini, DO   Medical Decision Making Amount and/or Complexity of Data Reviewed Labs: ordered. Radiology: ordered.   ***

## 2022-09-04 NOTE — ED Triage Notes (Signed)
Patient bib [facility] from home. EMS states patient went to work this morning and at around 10am started feeling like he was going to have a syncopal episode and he went home and laid down and the symptoms revolved symptoms return when standing. EMS states that initial BP was 80/50 and EKG shows A-fib with a rate of 110-150's. Patient does not have a history of a-fib, he does have a history of a stroke in 2022 with some left side lower extremity deficits. EMS states that after 500 NS his bp has come up to 108/54

## 2022-09-04 NOTE — Discharge Instructions (Addendum)
Your evaluation today showed that your creatinine (kidney function) had increased to 1.97.  Your creatinine had previously been 1.3.  He did receive IV fluids.  Additionally, your troponin (heart enzyme) increased from 11 to 27 to 33.  You will need close cardiology follow-up.  A referral has been placed in our system, but if you are more easily able to follow-up with cardiology through the [place], would recommend close outpatient care.  Also, follow-up with your primary care physician as soon as possible.  Monitor for any signs of worsening including chest pain, shortness of breath, lightheadedness, nausea, changes in mental status, and worsening symptoms with exertion.  Additionally monitor for any swelling in your lower extremities or difficulty sleeping/breathing when lying flat.  Return to the ED for any worsening of symptoms or further concerns.

## 2022-09-04 NOTE — ED Provider Notes (Signed)
[place] EMERGENCY DEPARTMENT AT [facility] Provider Note  MDM   HPI/ROS:  Samuel Serrano is a 72 y.o. male presenting via EMS for vision changes, lightheadedness/presyncope, and numbness of his upper lip.  Symptoms started this morning.  At around 10 this morning, patient was driving and started to see bright lights and all visual fields of both eyes.  This sensation lasted approximately 10 to 15 minutes before dissipating.  Upon getting out of his car, patient experienced sensation of lightheadedness and felt like he was going to pass out but never fully lost consciousness.  He denies any chest pain associated with the event but did experience some degree of shortness of breath.  Patient went inside and lie down and symptoms resolved.  Upon standing up again, light headedness returned.  When EMS arrived, initial blood pressure was 80/50 and patient was tachycardic with rates in the 110s to 150s.  Per EMS, patient was in atrial fibrillation however rhythm strip demonstrates sinus tachycardia with PVCs.  Patient was given half liter bolus and pressure normalized to 100s over 50s.  Physical exam is notable for: - Overall well-appearing, no acute distress - Cardiopulmonary exam within normal limits - Neurologically afocal  On my initial evaluation, patient is:  -Vital signs stable. Patient afebrile, hemodynamically stable, and non-toxic appearing. -Additional history obtained from EMS, chart review  Given the patient's history and physical exam, differential diagnosis includes but is not limited to orthostatic syncope, intracranial pathology such as CVA/ICH, vasovagal syncope, cardiac arrhythmia, infection, metabolic disturbance, etc.  Interpretations, interventions, and the patient's course of care are documented below.    Initial workup to include EKG, CBC, CMP, magnesium, TSH, troponin, chest x-ray, head CT.  Upon reassessment, patient resting comfortably with stable vital signs and  no new symptoms.  Workup resulted largely unremarkable aside from mild AKI.  Additional liter administered.  Given concern for TIA, MRI ordered to further evaluate.  Repeat EKG obtained, demonstrating multiple PVCs.  Care of this patient was assumed by oncoming team providers at time of signout at approximately 1600.  Plan at time of signout to follow-up MRI results, consider cardiology consultation/Holter monitor for frequent PVC's in context of presyncopal episode.  Disposition:  Handoff  Clinical Impression: No diagnosis found.  Rx / DC Orders ED Discharge Orders     None       The plan for this patient was discussed with Dr. Theresia Lo, who voiced agreement and who oversaw evaluation and treatment of this patient.   Clinical Complexity A medically appropriate history, review of systems, and physical exam was performed.  My independent interpretations of EKG, labs, and radiology are documented in the ED course above.   Click here for ABCD2, HEART and other calculatorsREFRESH Note before signing   Patient's presentation is most consistent with acute presentation with potential threat to life or bodily function.  Medical Decision Making Amount and/or Complexity of Data Reviewed Labs: ordered. Radiology: ordered.    HPI/ROS      See MDM section for pertinent HPI and ROS. A complete ROS was performed with pertinent positives/negatives noted above.   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[facility])     Past Surgical History:  Procedure Laterality Date   COLONOSCOPY     CYST EXCISION     benign cysts in breasts at age 84   ENDARTERECTOMY Left 07/24/2016   Procedure: Left Carotid  ENDARTERECTOMY;  Surgeon: Larina Earthly, MD;  Location: [facility] OR;  Service: Vascular;  Laterality: Left;   PATCH ANGIOPLASTY Left 07/24/2016   Procedure: PATCH ANGIOPLASTY;  Surgeon: Larina Earthly, MD;  Location: [facility] OR;  Service: Vascular;  Laterality: Left;   throat cancer surgery     WISDOM TOOTH EXTRACTION        Physical Exam   Vitals:   09/04/22 1215 09/04/22 1230 09/04/22 1245 09/04/22 1300  BP: 115/67 113/68 114/71 111/89  Pulse: 95 87 90 89  Resp: 19 18 19  (!) 22  Temp:      TempSrc:      SpO2: 97% 98% 97% 94%  Weight:      Height:        Physical Exam Vitals and nursing note reviewed.  Constitutional:      General: He is not in acute distress.    Appearance: He is well-developed.  HENT:     Head: Normocephalic and atraumatic.  Eyes:     Conjunctiva/sclera: Conjunctivae normal.  Cardiovascular:     Rate and Rhythm: Normal rate and regular rhythm.     Heart sounds: No murmur heard. Pulmonary:     Effort: Pulmonary effort is normal. No respiratory distress.     Breath sounds: Normal breath sounds.  Abdominal:     Palpations: Abdomen is soft.     Tenderness: There is no abdominal tenderness.  Musculoskeletal:        General: No swelling.     Cervical back: Neck supple.  Skin:    General: Skin is warm and dry.     Capillary Refill: Capillary refill takes less than 2 seconds.  Neurological:     Mental Status: He is alert.  Psychiatric:        Mood and Affect: Mood normal.     Starleen Arms, MD Department of Emergency Medicine   Please note that this documentation was produced with the assistance of voice-to-text technology and may contain errors.    Dyanne Iha, MD 09/04/22 1612    Elayne Snare K, DO 09/05/22 [phone]

## 2022-09-28 ENCOUNTER — Ambulatory Visit: Payer: Medicare Other | Admitting: Cardiology

## 2023-02-19 ENCOUNTER — Other Ambulatory Visit: Payer: Self-pay | Admitting: Neurosurgery

## 2023-02-19 DIAGNOSIS — M542 Cervicalgia: Secondary | ICD-10-CM

## 2023-02-22 ENCOUNTER — Ambulatory Visit
Admission: RE | Admit: 2023-02-22 | Discharge: 2023-02-22 | Disposition: A | Discharge status: Home / Self Care | Payer: No Typology Code available for payment source | Source: Ambulatory Visit | Attending: Neurosurgery | Admitting: Neurosurgery

## 2023-02-22 DIAGNOSIS — M542 Cervicalgia: Secondary | ICD-10-CM | POA: Insufficient documentation

## 2023-03-27 ENCOUNTER — Other Ambulatory Visit: Payer: Self-pay

## 2023-03-27 ENCOUNTER — Emergency Department (HOSPITAL_BASED_OUTPATIENT_CLINIC_OR_DEPARTMENT_OTHER)

## 2023-03-27 ENCOUNTER — Encounter (HOSPITAL_BASED_OUTPATIENT_CLINIC_OR_DEPARTMENT_OTHER): Payer: Self-pay | Admitting: Emergency Medicine

## 2023-03-27 ENCOUNTER — Emergency Department (HOSPITAL_BASED_OUTPATIENT_CLINIC_OR_DEPARTMENT_OTHER)
Admission: EM | Admit: 2023-03-27 | Discharge: 2023-03-27 | Disposition: A | Discharge status: Home / Self Care | Attending: Emergency Medicine | Admitting: Emergency Medicine

## 2023-03-27 DIAGNOSIS — Z8521 Personal history of malignant neoplasm of larynx: Secondary | ICD-10-CM | POA: Diagnosis not present

## 2023-03-27 DIAGNOSIS — Z79899 Other long term (current) drug therapy: Secondary | ICD-10-CM | POA: Diagnosis not present

## 2023-03-27 DIAGNOSIS — Z7982 Long term (current) use of aspirin: Secondary | ICD-10-CM | POA: Insufficient documentation

## 2023-03-27 DIAGNOSIS — M79662 Pain in left lower leg: Secondary | ICD-10-CM | POA: Diagnosis present

## 2023-03-27 DIAGNOSIS — I10 Essential (primary) hypertension: Secondary | ICD-10-CM | POA: Diagnosis not present

## 2023-03-27 DIAGNOSIS — M62831 Muscle spasm of calf: Secondary | ICD-10-CM | POA: Diagnosis not present

## 2023-03-27 DIAGNOSIS — Z8673 Personal history of transient ischemic attack (TIA), and cerebral infarction without residual deficits: Secondary | ICD-10-CM | POA: Diagnosis not present

## 2023-03-27 NOTE — ED Notes (Signed)
 Patient transported to Ultrasound

## 2023-03-27 NOTE — Discharge Instructions (Signed)
 Thank you for coming to [facility]. You were seen for left calf pain. We did an exam and imaging, and these showed no blood clot and instead likely a muscle spasm/muscle pain of your left calf. Please continue to take tylenol 1,000 mg every 8 hours and you can add a few days of ibuprofen 400 mg every 6-8 hours. Please stay well hydrated and use heat, stretching, and massage on the calf as well. You can continue to take your previously prescribed methocarbamol and oxycodone at home as needed.   Please follow up with your primary care provider within 1 week.   Do not hesitate to return to the ED or call 911 if you experience: -Worsening symptoms -Redness, swelling, increased numbness/tingling -Lightheadedness, passing out -Fevers/chills -Anything else that concerns you

## 2023-03-27 NOTE — ED Provider Notes (Signed)
 [place] [facility] Provider Note   CSN: [id number] Arrival date & time: 03/27/23  1414     History  Chief Complaint  Patient presents with   Leg Pain    Samuel Serrano is a 73 y.o. male with PMH as listed below who presents c/o intermittent LT calf pain x 1.5 wks. Atraumatic. Had neck surgery on 03/04/23 and was less mobile after surgery. Taking robaxin/oxycodone at home for neck surgery pain which doesn't really help. He notes that the pain starts high intensity every 3-4 hours and after he takes tylenol it does typically improve. Has been trying heat packs and stretching also which haven't helped. No f/c, CP, SOB, swelling, overlying skin changes. Has BL neuropathy but no worse than normal. No h/o DVT. Takes daily aspirin.  Past Medical History:  Diagnosis Date   Arthritis    Carotid artery occlusion    Duodenal ulcer 1988   HLD (hyperlipidemia)    HTN (hypertension)    Peripheral vascular disease (HCC)    left carotid stenosis   Sciatica    Sleep apnea treated with continuous positive airway pressure (CPAP)    Stroke (HCC)    Throat cancer (HCC)        Home Medications Prior to Admission medications   Medication Sig Start Date End Date Taking? Authorizing Provider  aspirin 81 MG chewable tablet Chew 81 mg by mouth daily.    [provider]  atorvastatin (LIPITOR) 80 MG tablet Take 80 mg by mouth daily.    [provider]  lisinopril-hydrochlorothiazide (ZESTORETIC) 20-12.5 MG tablet Take 2 tablets by mouth daily. 01/21/22   [provider]  methocarbamol (ROBAXIN) 750 MG tablet Take 750 mg by mouth as needed. 09/24/21   [provider]  Multiple Vitamin (MULTIVITAMIN PO) Take 1 tablet by mouth daily.    [provider]  traZODone (DESYREL) 100 MG tablet Take 300 mg by mouth at bedtime. 12/24/21   [provider]  Vitamin D, Cholecalciferol, 25 MCG (1000 UT) CAPS Take 2 capsules by mouth  in the morning.    [provider]      Allergies    Wellbutrin [bupropion]    Review of Systems   Review of Systems A 10 point review of systems was performed and is negative unless otherwise reported in HPI.  Physical Exam Updated Vital Signs BP 130/63   Pulse 72   Temp 98 F (36.7 C)   Resp 18   Ht 6\' 1"  (1.854 m)   Wt 98.4 kg   SpO2 96%   BMI 28.63 kg/m  Physical Exam General: Normal appearing male, lying in bed.  HEENT: Sclera anicteric, MMM, trachea midline.  Cardiology: RRR, no murmurs/rubs/gallops. Resp: Normal respiratory rate and effort. CTAB, no wheezes, rhonchi, crackles.  Abd: Soft, non-tender, non-distended. No rebound tenderness or guarding.  GU: Deferred. MSK: Point tenderness outpatient over a tight muscle in the lateral gastrocnemius on the left calf.  No overlying skin changes, no erythema, induration, or fluctuance.  Compartments are soft.  Distal pulses strong with warm foot.  Full range of motion of the ankle and knee with no effusions.  Denna Haggard' sign negative.  No peripheral edema or signs of trauma. Extremities without deformity.  Skin: warm, dry.  Neuro: A&Ox4, CNs II-XII grossly intact. MAEs. Sensation grossly intact.  Psych: Normal mood and affect.   ED Results / Procedures / Treatments   Labs (all labs ordered are listed, but only abnormal results  are displayed) Labs Reviewed - No data to display  EKG None  Radiology US Venous Img Lower Unilateral Left Result Date: 03/27/2023 CLINICAL DATA:  Left calf pain EXAM: LEFT LOWER EXTREMITY VENOUS DOPPLER ULTRASOUND TECHNIQUE: Gray-scale sonography with compression, as well as color and duplex ultrasound, were performed to evaluate the deep venous system(s) from the level of the common femoral vein through the popliteal and proximal calf veins. COMPARISON:  None Available. FINDINGS: VENOUS Normal compressibility of the common femoral, superficial femoral, and popliteal veins, as well as the  visualized calf veins. Visualized portions of profunda femoral vein and great saphenous vein unremarkable. No filling defects to suggest DVT on grayscale or color Doppler imaging. Doppler waveforms show normal direction of venous flow, normal respiratory plasticity and response to augmentation. Limited views of the contralateral common femoral vein are unremarkable. OTHER None. Limitations: none IMPRESSION: Negative. Electronically Signed   By: Duanne Guess D.O.   On: 03/27/2023 16:08    Procedures Procedures    Medications Ordered in ED Medications - No data to display  ED Course/ Medical Decision Making/ A&P                          Medical Decision Making Amount and/or Complexity of Data Reviewed Radiology:  Decision-making details documented in ED Course.    This patient presents to the ED for concern of left calf pain, this involves an extensive number of treatment options, and is a complaint that carries with it a high risk of complications and morbidity. Overall patient is very well-appearing and HDS.  MDM:    On exam patient is noted to have a tight muscle in his lateral gastrocnemius on the left which demonstrates point tenderness to palpation.  Obviously given his decreased mobility and recent surgery, there is concern for DVT, but reassuringly his DVT ultrasound is negative.  He has no overlying erythema induration to indicate cellulitis.  No fluctuance indicate abscess.  No swelling to indicate compartment syndrome.  Distal pulses are strong and foot is warm, no evidence of arterial ischemia.  No diffuse muscle pain or tenderness to indicate rhabdomyolysis.  No diffuse bodyaches indicate viral infection. I believe he is having muscular pain from possibly a muscle spasm.  I advised patient to continue his Robaxin and Tylenol at home.  Discussed adding a few days of ibuprofen for anti-inflammatory properties.  Also discussed hydration, continuing warm compresses, and adding  massage and stretching to his regimen.  Discussed return precautions including signs of infection or fever/chills as well as follow-up with his primary care physician if his symptoms do not improve within a few days.     Clinical Course as of 03/27/23 1734  Sat Mar 27, 2023  1611 US Venous Img Lower Unilateral Left Negative. [HN]    Clinical Course User Index [HN] Loetta Rough, MD     Imaging Studies ordered: [place] LLE DVT ordered from triage I independently visualized and interpreted imaging. I agree with the radiologist interpretation  Additional history obtained from chart review, wife at bedside.   Reevaluation: After the interventions noted above, I reevaluated the patient and found that they have :stayed the same  Social Determinants of Health: Lives independently  Disposition:  DC w/ discharge instructions/return precautions. All questions answered to patient's satisfaction.    Co morbidities that complicate the patient evaluation  Past Medical History:  Diagnosis Date   Arthritis    Carotid artery occlusion  Duodenal ulcer 1988   HLD (hyperlipidemia)    HTN (hypertension)    Peripheral vascular disease (HCC)    left carotid stenosis   Sciatica    Sleep apnea treated with continuous positive airway pressure (CPAP)    Stroke (HCC)    Throat cancer (HCC)      Medicines No orders of the defined types were placed in this encounter.   I have reviewed the patients home medicines and have made adjustments as needed  Problem List / ED Course: Problem List Items Addressed This Visit   None Visit Diagnoses       Muscle spasm of left calf    -  Primary                   This note was created using dictation software, which may contain spelling or grammatical errors.    Loetta Rough, MD 03/27/23 [phone]

## 2023-03-27 NOTE — ED Triage Notes (Signed)
 Pt c/o intermittent LT calf pain x 1.5 wks
